# Patient Record
Sex: Female | Born: 1960 | Race: Black or African American | Hispanic: No | Marital: Single | Smoking: Former smoker
Health system: Southern US, Community
[De-identification: ages and names within clinical notes are randomized; demographics above are authoritative.]

## PROBLEM LIST (undated history)

## (undated) DIAGNOSIS — E785 Hyperlipidemia, unspecified: Secondary | ICD-10-CM

## (undated) DIAGNOSIS — I10 Essential (primary) hypertension: Secondary | ICD-10-CM

## (undated) DIAGNOSIS — M1990 Unspecified osteoarthritis, unspecified site: Secondary | ICD-10-CM

## (undated) DIAGNOSIS — G510 Bell's palsy: Secondary | ICD-10-CM

## (undated) DIAGNOSIS — E119 Type 2 diabetes mellitus without complications: Secondary | ICD-10-CM

## (undated) HISTORY — DX: Essential (primary) hypertension: I10

## (undated) HISTORY — DX: Type 2 diabetes mellitus without complications: E11.9

## (undated) HISTORY — DX: Hyperlipidemia, unspecified: E78.5

## (undated) HISTORY — PX: LAPAROSCOPY: SHX197

---

## 2010-03-05 ENCOUNTER — Ambulatory Visit: Payer: Self-pay | Admitting: Internal Medicine

## 2010-07-14 ENCOUNTER — Ambulatory Visit: Payer: Self-pay | Admitting: Internal Medicine

## 2011-07-23 ENCOUNTER — Ambulatory Visit: Payer: Self-pay | Admitting: Internal Medicine

## 2011-07-24 LAB — HM MAMMOGRAPHY: HM Mammogram: NORMAL

## 2012-09-01 LAB — HM PAP SMEAR: HM Pap smear: NORMAL

## 2013-10-02 LAB — LIPID PANEL: LDL CALC: 134 mg/dL

## 2013-10-05 LAB — TSH: TSH: 0.8 u[IU]/mL (ref <=5.90)

## 2013-10-05 LAB — LIPID PANEL
CHOLESTEROL: 199 mg/dL (ref 0–200)
HDL: 41 mg/dL (ref 35–70)
TRIGLYCERIDES: 120 mg/dL (ref 40–160)

## 2013-10-05 LAB — BASIC METABOLIC PANEL
BUN: 16 mg/dL (ref 4–21)
Creatinine: 0.8 mg/dL (ref <=1.1)

## 2013-10-05 LAB — CBC AND DIFFERENTIAL: Hemoglobin: 13 g/dL (ref 12.0–16.0)

## 2014-01-28 ENCOUNTER — Ambulatory Visit: Payer: Self-pay | Admitting: Internal Medicine

## 2014-02-08 DIAGNOSIS — G510 Bell's palsy: Secondary | ICD-10-CM

## 2014-02-08 HISTORY — PX: OTHER SURGICAL HISTORY: SHX169

## 2014-02-08 HISTORY — DX: Bell's palsy: G51.0

## 2014-09-26 ENCOUNTER — Other Ambulatory Visit: Payer: Self-pay | Admitting: Internal Medicine

## 2014-09-29 ENCOUNTER — Encounter: Payer: Self-pay | Admitting: Internal Medicine

## 2014-09-29 DIAGNOSIS — E785 Hyperlipidemia, unspecified: Secondary | ICD-10-CM

## 2014-09-29 DIAGNOSIS — I10 Essential (primary) hypertension: Secondary | ICD-10-CM | POA: Insufficient documentation

## 2014-09-29 DIAGNOSIS — E1169 Type 2 diabetes mellitus with other specified complication: Secondary | ICD-10-CM | POA: Insufficient documentation

## 2014-10-18 ENCOUNTER — Ambulatory Visit: Payer: Self-pay | Admitting: Internal Medicine

## 2015-01-17 ENCOUNTER — Encounter: Payer: Self-pay | Admitting: Internal Medicine

## 2015-01-21 ENCOUNTER — Encounter: Payer: Self-pay | Admitting: Internal Medicine

## 2015-01-24 ENCOUNTER — Encounter: Payer: Self-pay | Admitting: Internal Medicine

## 2015-01-24 ENCOUNTER — Ambulatory Visit (INDEPENDENT_AMBULATORY_CARE_PROVIDER_SITE_OTHER): Payer: Managed Care, Other (non HMO) | Admitting: Internal Medicine

## 2015-01-24 VITALS — BP 168/104 | HR 76 | Ht 64.75 in | Wt 270.4 lb

## 2015-01-24 DIAGNOSIS — Z1239 Encounter for other screening for malignant neoplasm of breast: Secondary | ICD-10-CM

## 2015-01-24 DIAGNOSIS — E784 Other hyperlipidemia: Secondary | ICD-10-CM | POA: Diagnosis not present

## 2015-01-24 DIAGNOSIS — E7849 Other hyperlipidemia: Secondary | ICD-10-CM

## 2015-01-24 DIAGNOSIS — I10 Essential (primary) hypertension: Secondary | ICD-10-CM

## 2015-01-24 MED ORDER — AMLODIPINE BESYLATE 5 MG PO TABS: 5.0000 mg | ORAL_TABLET | Freq: Every day | ORAL | Status: DC

## 2015-01-24 MED ORDER — LOSARTAN POTASSIUM-HCTZ 100-25 MG PO TABS: 1.0000 | ORAL_TABLET | Freq: Every day | ORAL | Status: DC

## 2015-01-24 NOTE — Patient Instructions (Signed)
DASH Eating Plan  DASH stands for "Dietary Approaches to Stop Hypertension." The DASH eating plan is a healthy eating plan that has been shown to reduce high blood pressure (hypertension). Additional health benefits may include reducing the risk of type 2 diabetes mellitus, heart disease, and stroke. The DASH eating plan may also help with weight loss.  WHAT DO I NEED TO KNOW ABOUT THE DASH EATING PLAN?  For the DASH eating plan, you will follow these general guidelines:  · Choose foods with a percent daily value for sodium of less than 5% (as listed on the food label).  · Use salt-free seasonings or herbs instead of table salt or sea salt.  · Check with your health care provider or pharmacist before using salt substitutes.  · Eat lower-sodium products, often labeled as "lower sodium" or "no salt added."  · Eat fresh foods.  · Eat more vegetables, fruits, and low-fat dairy products.  · Choose whole grains. Look for the word "whole" as the first word in the ingredient list.  · Choose fish and skinless chicken or turkey more often than red meat. Limit fish, poultry, and meat to 6 oz (170 g) each day.  · Limit sweets, desserts, sugars, and sugary drinks.  · Choose heart-healthy fats.  · Limit cheese to 1 oz (28 g) per day.  · Eat more home-cooked food and less restaurant, buffet, and fast food.  · Limit fried foods.  · Cook foods using methods other than frying.  · Limit canned vegetables. If you do use them, rinse them well to decrease the sodium.  · When eating at a restaurant, ask that your food be prepared with less salt, or no salt if possible.  WHAT FOODS CAN I EAT?  Seek help from a dietitian for individual calorie needs.  Grains  Whole grain or whole wheat bread. Brown rice. Whole grain or whole wheat pasta. Quinoa, bulgur, and whole grain cereals. Low-sodium cereals. Corn or whole wheat flour tortillas. Whole grain cornbread. Whole grain crackers. Low-sodium crackers.  Vegetables  Fresh or frozen vegetables  (raw, steamed, roasted, or grilled). Low-sodium or reduced-sodium tomato and vegetable juices. Low-sodium or reduced-sodium tomato sauce and paste. Low-sodium or reduced-sodium canned vegetables.   Fruits  All fresh, canned (in natural juice), or frozen fruits.  Meat and Other Protein Products  Ground beef (85% or leaner), grass-fed beef, or beef trimmed of fat. Skinless chicken or turkey. Ground chicken or turkey. Pork trimmed of fat. All fish and seafood. Eggs. Dried beans, peas, or lentils. Unsalted nuts and seeds. Unsalted canned beans.  Dairy  Low-fat dairy products, such as skim or 1% milk, 2% or reduced-fat cheeses, low-fat ricotta or cottage cheese, or plain low-fat yogurt. Low-sodium or reduced-sodium cheeses.  Fats and Oils  Tub margarines without trans fats. Light or reduced-fat mayonnaise and salad dressings (reduced sodium). Avocado. Safflower, olive, or canola oils. Natural peanut or almond butter.  Other  Unsalted popcorn and pretzels.  The items listed above may not be a complete list of recommended foods or beverages. Contact your dietitian for more options.  WHAT FOODS ARE NOT RECOMMENDED?  Grains  White bread. White pasta. White rice. Refined cornbread. Bagels and croissants. Crackers that contain trans fat.  Vegetables  Creamed or fried vegetables. Vegetables in a cheese sauce. Regular canned vegetables. Regular canned tomato sauce and paste. Regular tomato and vegetable juices.  Fruits  Dried fruits. Canned fruit in light or heavy syrup. Fruit juice.  Meat and Other Protein   Products  Fatty cuts of meat. Ribs, chicken wings, bacon, sausage, bologna, salami, chitterlings, fatback, hot dogs, bratwurst, and packaged luncheon meats. Salted nuts and seeds. Canned beans with salt.  Dairy  Whole or 2% milk, cream, half-and-half, and cream cheese. Whole-fat or sweetened yogurt. Full-fat cheeses or blue cheese. Nondairy creamers and whipped toppings. Processed cheese, cheese spreads, or cheese  curds.  Condiments  Onion and garlic salt, seasoned salt, table salt, and sea salt. Canned and packaged gravies. Worcestershire sauce. Tartar sauce. Barbecue sauce. Teriyaki sauce. Soy sauce, including reduced sodium. Steak sauce. Fish sauce. Oyster sauce. Cocktail sauce. Horseradish. Ketchup and mustard. Meat flavorings and tenderizers. Bouillon cubes. Hot sauce. Tabasco sauce. Marinades. Taco seasonings. Relishes.  Fats and Oils  Butter, stick margarine, lard, shortening, ghee, and bacon fat. Coconut, palm kernel, or palm oils. Regular salad dressings.  Other  Pickles and olives. Salted popcorn and pretzels.  The items listed above may not be a complete list of foods and beverages to avoid. Contact your dietitian for more information.  WHERE CAN I FIND MORE INFORMATION?  National Heart, Lung, and [employer] Institute: www.nhlbi.nih.gov/health/health-topics/topics/dash/     This information is not intended to replace advice given to you by your health care provider. Make sure you discuss any questions you have with your health care provider.     Document Released: 01/14/2011 Document Revised: 02/15/2014 Document Reviewed: 11/29/2012  Elsevier Interactive Patient Education ©2016 [employer] Inc[employer]

## 2015-01-24 NOTE — Progress Notes (Signed)
Date:  01/24/2015   Name:  Anita Krueger   DOB:  [birth year]   MRN:  [id number]   Chief Complaint: Hypertension Hypertension This is a chronic problem. The current episode started more than 1 year ago. The problem has been gradually worsening (BP running [id number] diastolic) since onset. Associated symptoms include shortness of breath. Pertinent negatives include no chest pain, headaches, orthopnea or palpitations. There are no associated agents to hypertension. Past treatments include diuretics and angiotensin blockers. The current treatment provides significant improvement. There are no compliance problems.   Hyperlipidemia This is a chronic problem. The problem is controlled. Lipid results: borderline high LDL. Exacerbating diseases include obesity. Associated symptoms include shortness of breath. Pertinent negatives include no chest pain. Compliance problems include adherence to diet and adherence to exercise.      Review of Systems  Constitutional: Negative for fever, chills and fatigue.  HENT: Negative for hearing loss.   Eyes: Negative for visual disturbance.  Respiratory: Positive for shortness of breath. Negative for cough, chest tightness and wheezing.   Cardiovascular: Positive for leg swelling. Negative for chest pain, palpitations and orthopnea.  Gastrointestinal: Negative for abdominal pain and blood in stool.  Genitourinary: Negative for dysuria.  Musculoskeletal: Negative for back pain and joint swelling.  Skin: Negative for rash.  Neurological: Negative for headaches.    Patient Active Problem List   Diagnosis Date Noted  . 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 Hypertension 09/29/2014    Prior to Admission medications   Medication Sig Start Date End Date Taking? Authorizing Provider  losartan-hydrochlorothiazide (HYZAAR) 100-25 MG per tablet Take 1 tablet by mouth  daily 09/26/14  Yes Glean Hess, MD    No Known Allergies  No past surgical  history on file.  Social History  Substance Use Topics  . Smoking status: Never Smoker   . Smokeless tobacco: None  . Alcohol Use: 1.2 oz/week    2 Standard drinks or equivalent per week    Medication list has been reviewed and updated.   Physical Exam  Constitutional: She is oriented to person, place, and time. She appears well-developed and well-nourished. No distress.  HENT:  Head: Normocephalic and atraumatic.  Eyes: Conjunctivae are normal. Right eye exhibits no discharge. Left eye exhibits no discharge. No scleral icterus.  Neck: Normal range of motion. Neck supple. Carotid bruit is not present. No thyromegaly present.  Cardiovascular: Normal rate, regular rhythm and normal heart sounds.   Pulmonary/Chest: Effort normal and breath sounds normal. No respiratory distress.  Musculoskeletal: Normal range of motion.  Lymphadenopathy:    She has no cervical adenopathy.  Neurological: She is alert and oriented to person, place, and time.  Skin: Skin is warm and dry. No rash noted.  Psychiatric: She has a normal mood and affect. Her behavior is normal. Thought content normal.  Nursing note and vitals reviewed.   BP 168/104 mmHg  Pulse 76  Ht 5' 4.75" (1.645 m)  Wt 270 lb 6.4 oz (122.653 kg)  BMI 45.33 kg/m2  Assessment and Plan: 1. Essential hypertension Add amlodipine 5 mg daily Recheck visit in 2 months - Comprehensive metabolic panel - TSH - losartan-hydrochlorothiazide (HYZAAR) 100-25 MG tablet; Take 1 tablet by mouth daily.  Dispense: 90 tablet; Refill: 3 - amLODipine (NORVASC) 5 MG tablet; Take 1 tablet (5 mg total) by mouth daily.  Dispense: 90 tablet; Refill: 3  2. Breast cancer screening - MM DIGITAL SCREENING BILATERAL; Future  3. Familial multiple lipoprotein-type hyperlipidemia Borderline elevated lipids  in the past Reinforced diet and exercise and will prescribe medication if indicated - Lipid panel   Halina Maidens, MD [facility]  01/24/2015

## 2015-01-25 LAB — COMPREHENSIVE METABOLIC PANEL
ALBUMIN: 4.3 g/dL (ref 3.5–5.5)
ALT: 35 IU/L — ABNORMAL HIGH (ref 0–32)
AST: 37 IU/L (ref 0–40)
Albumin/Globulin Ratio: 1.6 (ref 1.1–2.5)
Alkaline Phosphatase: 78 IU/L (ref 39–117)
BUN / CREAT RATIO: 17 (ref 9–23)
BUN: 12 mg/dL (ref 6–24)
Bilirubin Total: 0.3 mg/dL (ref 0.0–1.2)
CALCIUM: 9.5 mg/dL (ref 8.7–10.2)
CO2: 26 mmol/L (ref 18–29)
CREATININE: 0.69 mg/dL (ref 0.57–1.00)
Chloride: 97 mmol/L (ref 96–106)
GFR, EST AFRICAN AMERICAN: 115 mL/min/{1.73_m2} (ref >59)
GFR, EST NON AFRICAN AMERICAN: 100 mL/min/{1.73_m2} (ref >59)
GLOBULIN, TOTAL: 2.7 g/dL (ref 1.5–4.5)
Glucose: 127 mg/dL — ABNORMAL HIGH (ref 65–99)
Potassium: 4 mmol/L (ref 3.5–5.2)
SODIUM: 140 mmol/L (ref 134–144)
Total Protein: 7 g/dL (ref 6.0–8.5)

## 2015-01-25 LAB — LIPID PANEL
CHOL/HDL RATIO: 4.6 ratio — AB (ref 0.0–4.4)
Cholesterol, Total: 192 mg/dL (ref 100–199)
HDL: 42 mg/dL (ref >39)
LDL CALC: 123 mg/dL — AB (ref 0–99)
TRIGLYCERIDES: 135 mg/dL (ref 0–149)
VLDL Cholesterol Cal: 27 mg/dL (ref 5–40)

## 2015-01-25 LAB — TSH: TSH: 1.13 u[IU]/mL (ref 0.450–4.500)

## 2015-01-27 ENCOUNTER — Other Ambulatory Visit: Payer: Self-pay | Admitting: Internal Medicine

## 2015-01-27 DIAGNOSIS — E7849 Other hyperlipidemia: Secondary | ICD-10-CM

## 2015-01-27 DIAGNOSIS — R7309 Other abnormal glucose: Secondary | ICD-10-CM

## 2015-01-27 DIAGNOSIS — E118 Type 2 diabetes mellitus with unspecified complications: Secondary | ICD-10-CM | POA: Insufficient documentation

## 2015-01-27 MED ORDER — ATORVASTATIN CALCIUM 10 MG PO TABS: 10.0000 mg | ORAL_TABLET | Freq: Every day | ORAL | Status: DC

## 2015-03-07 ENCOUNTER — Ambulatory Visit
Admission: RE | Admit: 2015-03-07 | Discharge: 2015-03-07 | Disposition: A | Discharge status: Home / Self Care | Payer: Managed Care, Other (non HMO) | Source: Ambulatory Visit | Attending: Internal Medicine | Admitting: Internal Medicine

## 2015-03-07 DIAGNOSIS — Z1231 Encounter for screening mammogram for malignant neoplasm of breast: Secondary | ICD-10-CM | POA: Insufficient documentation

## 2015-03-07 DIAGNOSIS — Z1239 Encounter for other screening for malignant neoplasm of breast: Secondary | ICD-10-CM

## 2015-04-04 ENCOUNTER — Encounter: Payer: Self-pay | Admitting: Internal Medicine

## 2015-04-04 ENCOUNTER — Ambulatory Visit (INDEPENDENT_AMBULATORY_CARE_PROVIDER_SITE_OTHER): Payer: Managed Care, Other (non HMO) | Admitting: Internal Medicine

## 2015-04-04 VITALS — BP 148/96 | HR 72 | Ht 64.75 in | Wt 265.0 lb

## 2015-04-04 DIAGNOSIS — E784 Other hyperlipidemia: Secondary | ICD-10-CM

## 2015-04-04 DIAGNOSIS — R7309 Other abnormal glucose: Secondary | ICD-10-CM

## 2015-04-04 DIAGNOSIS — E7849 Other hyperlipidemia: Secondary | ICD-10-CM

## 2015-04-04 DIAGNOSIS — Z1159 Encounter for screening for other viral diseases: Secondary | ICD-10-CM

## 2015-04-04 DIAGNOSIS — Z0000 Encounter for general adult medical examination without abnormal findings: Secondary | ICD-10-CM

## 2015-04-04 DIAGNOSIS — I10 Essential (primary) hypertension: Secondary | ICD-10-CM | POA: Diagnosis not present

## 2015-04-04 LAB — POC URINALYSIS WITH MICROSCOPIC (NON AUTO)MANUAL RESULT
BILIRUBIN UA: NEGATIVE
Crystals: 0
EPITHELIAL CELLS, URINE PER MICROSCOPY: 5
Glucose, UA: NEGATIVE
Ketones, UA: NEGATIVE
Mucus, UA: 0
NITRITE UA: NEGATIVE
PH UA: 6.5
Protein, UA: NEGATIVE
RBC: 2 M/uL — AB (ref 4.04–5.48)
SPEC GRAV UA: 1.01
UROBILINOGEN UA: 0.2
WBC Casts, UA: 3

## 2015-04-04 NOTE — Progress Notes (Signed)
Date:  04/04/2015   Name:  SAINA MANFRED   DOB:  30-Sep-1960   MRN:  [id number]   Chief Complaint: Annual Exam Rosalee ALIZIA JANKOVIC is a 55 y.o. female who presents today for her Complete Annual Exam. She feels well. She reports exercising walking daily. She reports she is sleeping fairly well. Recent mammogram was normal. She still has intermittent menstrual bleeding that is very light.  Last Pap was 2014.  Hypertension This is a chronic problem. The current episode started more than 1 year ago. The problem is unchanged (systolic around [id number]). The problem is controlled. Pertinent negatives include no chest pain, headaches, palpitations or shortness of breath. There are no known risk factors for coronary artery disease. Past treatments include calcium channel blockers, angiotensin blockers and diuretics (amlodipine added last visit). The current treatment provides significant improvement.  Hyperlipidemia This is a chronic problem. The current episode started more than 1 year ago. Pertinent negatives include no chest pain or shortness of breath. She is currently on no antihyperlipidemic treatment. The current treatment provides significant improvement of lipids.  10 yr ASCVD calculated at 15%.  She is hesitant to start medication due to possible side effects.  We discussed risk reduction and she agrees to try atorvastatin.   Review of Systems  Constitutional: Negative for fever, chills and fatigue.  HENT: Negative for congestion, hearing loss, tinnitus, trouble swallowing and voice change.   Eyes: Negative for visual disturbance.  Respiratory: Negative for cough, chest tightness, shortness of breath and wheezing.   Cardiovascular: Negative for chest pain, palpitations and leg swelling.  Gastrointestinal: Negative for vomiting, abdominal pain, diarrhea and constipation.  Endocrine: Negative for polydipsia and polyuria.  Genitourinary: Negative for dysuria, frequency, vaginal bleeding, vaginal  discharge and genital sores.  Musculoskeletal: Negative for joint swelling, arthralgias and gait problem.  Skin: Negative for color change and rash.  Neurological: Negative for dizziness, tremors, light-headedness and headaches.  Hematological: Negative for adenopathy. Does not bruise/bleed easily.  Psychiatric/Behavioral: Negative for sleep disturbance and dysphoric mood. The patient is not nervous/anxious.     Patient Active Problem List   Diagnosis Date Noted  . Elevated random blood glucose level 01/27/2015  . 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 Hypertension 09/29/2014    Prior to Admission medications   Medication Sig Start Date End Date Taking? Authorizing Provider  amLODipine (NORVASC) 5 MG tablet Take 1 tablet (5 mg total) by mouth daily. 01/24/15  Yes Glean Hess, MD  losartan-hydrochlorothiazide (HYZAAR) 100-25 MG tablet Take 1 tablet by mouth daily. 01/24/15  Yes Glean Hess, MD  atorvastatin (LIPITOR) 10 MG tablet Take 1 tablet (10 mg total) by mouth daily. Patient not taking: Reported on 04/04/2015 01/27/15   Glean Hess, MD    No Known Allergies  Past Surgical History  Procedure Laterality Date  . No past surgeries      Social History  Substance Use Topics  . Smoking status: Never Smoker   . Smokeless tobacco: None  . Alcohol Use: 1.2 oz/week    2 Standard drinks or equivalent per week     Comment: occasional     Medication list has been reviewed and updated.   Physical Exam  Constitutional: She is oriented to person, place, and time. She appears well-developed and well-nourished. No distress.  HENT:  Head: Normocephalic and atraumatic.  Right Ear: Tympanic membrane and ear canal normal.  Left Ear: Tympanic membrane and ear canal normal.  Nose: Right sinus exhibits  no maxillary sinus tenderness. Left sinus exhibits no maxillary sinus tenderness.  Mouth/Throat: Uvula is midline and oropharynx is clear and moist.    Eyes: Conjunctivae and EOM are normal. Right eye exhibits no discharge. Left eye exhibits no discharge. No scleral icterus.  Neck: Normal range of motion. Carotid bruit is not present. No erythema present. No thyromegaly present.  Cardiovascular: Normal rate, regular rhythm, normal heart sounds and normal pulses.   Pulmonary/Chest: Effort normal. No respiratory distress. She has no wheezes. Right breast exhibits no mass, no nipple discharge, no skin change and no tenderness. Left breast exhibits no mass, no nipple discharge, no skin change and no tenderness.  Abdominal: Soft. Bowel sounds are normal. There is no hepatosplenomegaly. There is no tenderness. There is no CVA tenderness.  Musculoskeletal: Normal range of motion.  Lymphadenopathy:    She has no cervical adenopathy.    She has no axillary adenopathy.  Neurological: She is alert and oriented to person, place, and time. She has normal reflexes. No cranial nerve deficit or sensory deficit.  Skin: Skin is warm, dry and intact. No rash noted.  Psychiatric: She has a normal mood and affect. Her speech is normal and behavior is normal. Thought content normal.  Nursing note and vitals reviewed.   BP 148/96 mmHg  Pulse 72  Ht 5' 4.75" (1.645 m)  Wt 265 lb (120.203 kg)  BMI 44.42 kg/m2  Assessment and Plan: 1. Annual physical exam Pap next year Pt declines Colonoscopy at this time - Fecal occult blood, imunochemical - POC urinalysis w microscopic (non auto)  2. Essential hypertension Improved control - CBC with Differential/Platelet - Comprehensive metabolic panel - TSH  3. Familial multiple lipoprotein-type hyperlipidemia Begin atorvastatin and recheck in 4 months - Lipid panel  4. Elevated random blood glucose level Continue efforts at low carb diet - Hemoglobin A1c  5. Need for hepatitis C screening test - Hepatitis C antibody   Halina Maidens, MD [facility]  04/04/2015

## 2015-04-04 NOTE — Patient Instructions (Signed)
DASH Eating Plan  DASH stands for "Dietary Approaches to Stop Hypertension." The DASH eating plan is a healthy eating plan that has been shown to reduce high blood pressure (hypertension). Additional health benefits may include reducing the risk of type 2 diabetes mellitus, heart disease, and stroke. The DASH eating plan may also help with weight loss.  WHAT DO I NEED TO KNOW ABOUT THE DASH EATING PLAN?  For the DASH eating plan, you will follow these general guidelines:  · Choose foods with a percent daily value for sodium of less than 5% (as listed on the food label).  · Use salt-free seasonings or herbs instead of table salt or sea salt.  · Check with your health care provider or pharmacist before using salt substitutes.  · Eat lower-sodium products, often labeled as "lower sodium" or "no salt added."  · Eat fresh foods.  · Eat more vegetables, fruits, and low-fat dairy products.  · Choose whole grains. Look for the word "whole" as the first word in the ingredient list.  · Choose fish and skinless chicken or turkey more often than red meat. Limit fish, poultry, and meat to 6 oz (170 g) each day.  · Limit sweets, desserts, sugars, and sugary drinks.  · Choose heart-healthy fats.  · Limit cheese to 1 oz (28 g) per day.  · Eat more home-cooked food and less restaurant, buffet, and fast food.  · Limit fried foods.  · Cook foods using methods other than frying.  · Limit canned vegetables. If you do use them, rinse them well to decrease the sodium.  · When eating at a restaurant, ask that your food be prepared with less salt, or no salt if possible.  WHAT FOODS CAN I EAT?  Seek help from a dietitian for individual calorie needs.  Grains  Whole grain or whole wheat bread. Brown rice. Whole grain or whole wheat pasta. Quinoa, bulgur, and whole grain cereals. Low-sodium cereals. Corn or whole wheat flour tortillas. Whole grain cornbread. Whole grain crackers. Low-sodium crackers.  Vegetables  Fresh or frozen vegetables  (raw, steamed, roasted, or grilled). Low-sodium or reduced-sodium tomato and vegetable juices. Low-sodium or reduced-sodium tomato sauce and paste. Low-sodium or reduced-sodium canned vegetables.   Fruits  All fresh, canned (in natural juice), or frozen fruits.  Meat and Other Protein Products  Ground beef (85% or leaner), grass-fed beef, or beef trimmed of fat. Skinless chicken or turkey. Ground chicken or turkey. Pork trimmed of fat. All fish and seafood. Eggs. Dried beans, peas, or lentils. Unsalted nuts and seeds. Unsalted canned beans.  Dairy  Low-fat dairy products, such as skim or 1% milk, 2% or reduced-fat cheeses, low-fat ricotta or cottage cheese, or plain low-fat yogurt. Low-sodium or reduced-sodium cheeses.  Fats and Oils  Tub margarines without trans fats. Light or reduced-fat mayonnaise and salad dressings (reduced sodium). Avocado. Safflower, olive, or canola oils. Natural peanut or almond butter.  Other  Unsalted popcorn and pretzels.  The items listed above may not be a complete list of recommended foods or beverages. Contact your dietitian for more options.  WHAT FOODS ARE NOT RECOMMENDED?  Grains  White bread. White pasta. White rice. Refined cornbread. Bagels and croissants. Crackers that contain trans fat.  Vegetables  Creamed or fried vegetables. Vegetables in a cheese sauce. Regular canned vegetables. Regular canned tomato sauce and paste. Regular tomato and vegetable juices.  Fruits  Dried fruits. Canned fruit in light or heavy syrup. Fruit juice.  Meat and Other Protein   Products  Fatty cuts of meat. Ribs, chicken wings, bacon, sausage, bologna, salami, chitterlings, fatback, hot dogs, bratwurst, and packaged luncheon meats. Salted nuts and seeds. Canned beans with salt.  Dairy  Whole or 2% milk, cream, half-and-half, and cream cheese. Whole-fat or sweetened yogurt. Full-fat cheeses or blue cheese. Nondairy creamers and whipped toppings. Processed cheese, cheese spreads, or cheese  curds.  Condiments  Onion and garlic salt, seasoned salt, table salt, and sea salt. Canned and packaged gravies. Worcestershire sauce. Tartar sauce. Barbecue sauce. Teriyaki sauce. Soy sauce, including reduced sodium. Steak sauce. Fish sauce. Oyster sauce. Cocktail sauce. Horseradish. Ketchup and mustard. Meat flavorings and tenderizers. Bouillon cubes. Hot sauce. Tabasco sauce. Marinades. Taco seasonings. Relishes.  Fats and Oils  Butter, stick margarine, lard, shortening, ghee, and bacon fat. Coconut, palm kernel, or palm oils. Regular salad dressings.  Other  Pickles and olives. Salted popcorn and pretzels.  The items listed above may not be a complete list of foods and beverages to avoid. Contact your dietitian for more information.  WHERE CAN I FIND MORE INFORMATION?  National Heart, Lung, and [employer] Institute: www.nhlbi.nih.gov/health/health-topics/topics/dash/     This information is not intended to replace advice given to you by your health care provider. Make sure you discuss any questions you have with your health care provider.     Document Released: 01/14/2011 Document Revised: 02/15/2014 Document Reviewed: 11/29/2012  Elsevier Interactive Patient Education ©2016 [employer] Inc[employer]

## 2015-04-05 LAB — LIPID PANEL
CHOLESTEROL TOTAL: 202 mg/dL — AB (ref 100–199)
Chol/HDL Ratio: 4.5 ratio units — ABNORMAL HIGH (ref 0.0–4.4)
HDL: 45 mg/dL (ref >39)
LDL Calculated: 131 mg/dL — ABNORMAL HIGH (ref 0–99)
Triglycerides: 130 mg/dL (ref 0–149)
VLDL CHOLESTEROL CAL: 26 mg/dL (ref 5–40)

## 2015-04-05 LAB — CBC WITH DIFFERENTIAL/PLATELET
BASOS: 0 %
Basophils Absolute: 0 10*3/uL (ref 0.0–0.2)
EOS (ABSOLUTE): 0.2 10*3/uL (ref 0.0–0.4)
Eos: 2 %
HEMOGLOBIN: 12.9 g/dL (ref 11.1–15.9)
Hematocrit: 39.2 % (ref 34.0–46.6)
IMMATURE GRANS (ABS): 0 10*3/uL (ref 0.0–0.1)
Immature Granulocytes: 0 %
LYMPHS ABS: 2.3 10*3/uL (ref 0.7–3.1)
LYMPHS: 30 %
MCH: 27.6 pg (ref 26.6–33.0)
MCHC: 32.9 g/dL (ref 31.5–35.7)
MCV: 84 fL (ref 79–97)
MONOCYTES: 6 %
Monocytes Absolute: 0.5 10*3/uL (ref 0.1–0.9)
NEUTROS ABS: 4.7 10*3/uL (ref 1.4–7.0)
Neutrophils: 62 %
Platelets: 277 10*3/uL (ref 150–379)
RBC: 4.68 x10E6/uL (ref 3.77–5.28)
RDW: 16.2 % — ABNORMAL HIGH (ref 12.3–15.4)
WBC: 7.7 10*3/uL (ref 3.4–10.8)

## 2015-04-05 LAB — COMPREHENSIVE METABOLIC PANEL
A/G RATIO: 1.6 (ref 1.1–2.5)
ALBUMIN: 4.4 g/dL (ref 3.5–5.5)
ALK PHOS: 69 IU/L (ref 39–117)
ALT: 34 IU/L — ABNORMAL HIGH (ref 0–32)
AST: 34 IU/L (ref 0–40)
BILIRUBIN TOTAL: 0.4 mg/dL (ref 0.0–1.2)
BUN / CREAT RATIO: 17 (ref 9–23)
BUN: 11 mg/dL (ref 6–24)
CO2: 25 mmol/L (ref 18–29)
Calcium: 9.2 mg/dL (ref 8.7–10.2)
Chloride: 99 mmol/L (ref 96–106)
Creatinine, Ser: 0.65 mg/dL (ref 0.57–1.00)
GFR calc Af Amer: 117 mL/min/{1.73_m2} (ref >59)
GFR calc non Af Amer: 102 mL/min/{1.73_m2} (ref >59)
GLOBULIN, TOTAL: 2.7 g/dL (ref 1.5–4.5)
GLUCOSE: 93 mg/dL (ref 65–99)
Potassium: 4 mmol/L (ref 3.5–5.2)
SODIUM: 143 mmol/L (ref 134–144)
Total Protein: 7.1 g/dL (ref 6.0–8.5)

## 2015-04-05 LAB — HEMOGLOBIN A1C
Est. average glucose Bld gHb Est-mCnc: 134 mg/dL
HEMOGLOBIN A1C: 6.3 % — AB (ref 4.8–5.6)

## 2015-04-05 LAB — HEPATITIS C ANTIBODY

## 2015-04-05 LAB — TSH: TSH: 0.833 u[IU]/mL (ref 0.450–4.500)

## 2015-04-07 ENCOUNTER — Telehealth: Payer: Self-pay

## 2015-04-07 NOTE — Telephone Encounter (Signed)
Left message for patient to call back  

## 2015-04-07 NOTE — Telephone Encounter (Signed)
-----   Message from Glean Hess, MD sent at 04/05/2015 10:06 AM EST ----- Cholesterol remains elevated.  BS is still in the pre-diabetic range - no meds but work on low carb diet.  All other labs are normal.  Hep C is negative.

## 2015-04-10 NOTE — Telephone Encounter (Signed)
Left message for patient to call back  

## 2015-04-11 NOTE — Telephone Encounter (Signed)
Spoke with patient. Patient advised of all results and verbalized understanding. Will call back with any future questions or concerns. MAH  

## 2015-04-12 LAB — FECAL OCCULT BLOOD, IMMUNOCHEMICAL: Fecal Occult Bld: NEGATIVE

## 2015-04-16 ENCOUNTER — Telehealth: Payer: Self-pay

## 2015-04-16 NOTE — Telephone Encounter (Signed)
Left message for patient to call back  

## 2015-04-16 NOTE — Telephone Encounter (Signed)
-----   Message from Glean Hess, MD sent at 04/12/2015  2:06 PM EST ----- Stool hemoccult testing is negative.

## 2015-04-18 NOTE — Telephone Encounter (Signed)
Spoke with patient. Patient advised of all results and verbalized understanding. Will call back with any future questions or concerns. MAH  

## 2015-08-01 ENCOUNTER — Ambulatory Visit: Payer: Self-pay | Admitting: Internal Medicine

## 2015-08-08 ENCOUNTER — Ambulatory Visit: Payer: Self-pay | Admitting: Internal Medicine

## 2015-09-19 ENCOUNTER — Ambulatory Visit: Payer: Self-pay | Admitting: Internal Medicine

## 2016-02-11 ENCOUNTER — Other Ambulatory Visit: Payer: Self-pay | Admitting: Internal Medicine

## 2016-02-11 DIAGNOSIS — I10 Essential (primary) hypertension: Secondary | ICD-10-CM

## 2016-02-20 ENCOUNTER — Encounter: Payer: Self-pay | Admitting: Internal Medicine

## 2016-04-21 ENCOUNTER — Ambulatory Visit
Admission: EM | Admit: 2016-04-21 | Discharge: 2016-04-21 | Disposition: A | Discharge status: Home / Self Care | Payer: Managed Care, Other (non HMO) | Attending: Internal Medicine | Admitting: Internal Medicine

## 2016-04-21 ENCOUNTER — Encounter: Payer: Self-pay | Admitting: Emergency Medicine

## 2016-04-21 DIAGNOSIS — S76212A Strain of adductor muscle, fascia and tendon of left thigh, initial encounter: Secondary | ICD-10-CM

## 2016-04-21 MED ORDER — CYCLOBENZAPRINE HCL 10 MG PO TABS: 10.0000 mg | ORAL_TABLET | Freq: Two times a day (BID) | ORAL | 0 refills | Status: DC | PRN

## 2016-04-21 NOTE — ED Triage Notes (Signed)
Patient c/o pain on the left side of her abdomen that started today.  Patient denies N/V/D.  Patient denies fevers.

## 2016-04-21 NOTE — ED Provider Notes (Signed)
[facility]    CSN: [id number] Arrival date & time: 04/21/16  1750     History   Chief Complaint Chief Complaint  Patient presents with  . Abdominal Pain    LLQ    HPI Anita Krueger is a 56 y.o. female.   Pt presents to clinic with groin pain. Initially said abdominal pain but she points clearly to her left groin.  Denies radiation of pain. States that it began hurting a few days ago after exercise.  Now the pain limits her ability to rise from a seated position without pain.  She also feels pain in her groin when she walks briskly.  Denies N/V/D or urinary symptoms.  Also denies neurologic complaints.      History reviewed. No pertinent past medical history.  Patient Active Problem List   Diagnosis Date Noted  . Essential hypertension 04/04/2015  . Elevated random blood glucose level 01/27/2015  . 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 NO PAST SURGERIES      OB History    No data available       Home Medications    Prior to Admission medications   Medication Sig Start Date End Date Taking? Authorizing Provider  cyclobenzaprine (FLEXERIL) 10 MG tablet Take 1 tablet (10 mg total) by mouth 2 (two) times daily as needed for muscle spasms. 04/21/16   Harrie Foreman, MD  losartan-hydrochlorothiazide [facility]) 100-25 MG tablet TAKE 1 TABLET BY MOUTH  DAILY 02/11/16   Glean Hess, MD    Family History Family History  Problem Relation Age of Onset  . Hypertension Mother   . Diabetes Mother   . Hypertension Father   . Breast cancer Cousin     PAT COUSIN    Social History Social History  Substance Use Topics  . Smoking status: Never Smoker  . Smokeless tobacco: Never Used  . Alcohol use 1.2 oz/week    2 Standard drinks or equivalent per week     Comment: occasional     Allergies   Patient has no known allergies.   Review of Systems Review of Systems    Constitutional: Negative for chills and fever.  HENT: Negative for sore throat and tinnitus.   Eyes: Negative for redness.  Respiratory: Negative for cough and shortness of breath.   Cardiovascular: Negative for chest pain and palpitations.  Gastrointestinal: Negative for abdominal pain, diarrhea, nausea and vomiting.  Genitourinary: Negative for dysuria, frequency and urgency.  Musculoskeletal: Negative for myalgias.       See HPI  Skin: Negative for rash.       No lesions  Neurological: Negative for weakness.  Hematological: Does not bruise/bleed easily.  Psychiatric/Behavioral: Negative for suicidal ideas.     Physical Exam Triage Vital Signs ED Triage Vitals  Enc Vitals Group     BP 04/21/16 1809 (!) 159/76     Pulse Rate 04/21/16 1809 94     Resp 04/21/16 1809 16     Temp 04/21/16 1809 98.2 F (36.8 C)     Temp Source 04/21/16 1809 Oral     SpO2 04/21/16 1809 100 %     Weight 04/21/16 1807 275 lb (124.7 kg)     Height 04/21/16 1807 5\' 4"  (1.626 m)     Head Circumference --      Peak Flow --      Pain Score 04/21/16 1809 8     Pain  Loc --      Pain Edu? --      Excl. in [place]? --    No data found.   Updated Vital Signs BP (!) 159/76 (BP Location: Right Arm)   Pulse 94   Temp 98.2 F (36.8 C) (Oral)   Resp 16   Ht 5\' 4"  (1.626 m)   Wt 275 lb (124.7 kg)   SpO2 100%   BMI 47.20 kg/m   Visual Acuity Right Eye Distance:   Left Eye Distance:   Bilateral Distance:    Right Eye Near:   Left Eye Near:    Bilateral Near:     Physical Exam  Constitutional: She is oriented to person, place, and time. She appears well-developed and well-nourished. No distress.  HENT:  Head: Normocephalic and atraumatic.  Mouth/Throat: Oropharynx is clear and moist.  Eyes: Conjunctivae and EOM are normal. Pupils are equal, round, and reactive to light. No scleral icterus.  Neck: Normal range of motion. Neck supple. No JVD present. No tracheal deviation present. No thyromegaly  present.  Cardiovascular: Normal rate, regular rhythm and normal heart sounds.  Exam reveals no gallop and no friction rub.   No murmur heard. Pulmonary/Chest: Effort normal and breath sounds normal.  Abdominal: Soft. Bowel sounds are normal. She exhibits no distension. There is no tenderness.  Musculoskeletal: Normal range of motion. She exhibits tenderness (left groin). She exhibits no edema.  Lymphadenopathy:    She has no cervical adenopathy.  Neurological: She is alert and oriented to person, place, and time. No cranial nerve deficit.  Skin: Skin is warm and dry.  Psychiatric: She has a normal mood and affect. Her behavior is normal. Judgment and thought content normal.  Nursing note and vitals reviewed.    UC Treatments / Results  Labs (all labs ordered are listed, but only abnormal results are displayed) Labs Reviewed - No data to display  EKG  EKG Interpretation None       Radiology No results found.  Procedures Procedures (including critical care time)  Medications Ordered in UC Medications - No data to display   Initial Impression / Assessment and Plan / UC Course  I have reviewed the triage vital signs and the nursing notes.  Pertinent labs & imaging results that were available during my care of the patient were reviewed by me and considered in my medical decision making (see chart for details).     Reproducible left groin pain.  Musculoskeletal.  D/w pt stretching exercises and NSAID use.  Rx muscle relaxant.  Final Clinical Impressions(s) / UC Diagnoses   Final diagnoses:  Groin strain, left, initial encounter    New Prescriptions New Prescriptions   CYCLOBENZAPRINE (FLEXERIL) 10 MG TABLET    Take 1 tablet (10 mg total) by mouth 2 (two) times daily as needed for muscle spasms.     Harrie Foreman, MD 04/21/16 [phone]

## 2016-05-04 ENCOUNTER — Encounter: Payer: Self-pay | Admitting: Internal Medicine

## 2016-05-04 ENCOUNTER — Ambulatory Visit (INDEPENDENT_AMBULATORY_CARE_PROVIDER_SITE_OTHER): Payer: Managed Care, Other (non HMO) | Admitting: Internal Medicine

## 2016-05-04 ENCOUNTER — Ambulatory Visit
Admission: RE | Admit: 2016-05-04 | Discharge: 2016-05-04 | Disposition: A | Discharge status: Home / Self Care | Payer: Managed Care, Other (non HMO) | Source: Ambulatory Visit | Attending: Internal Medicine | Admitting: Internal Medicine

## 2016-05-04 VITALS — BP 162/94 | HR 90 | Ht 64.75 in | Wt 273.0 lb

## 2016-05-04 DIAGNOSIS — M25552 Pain in left hip: Secondary | ICD-10-CM

## 2016-05-04 DIAGNOSIS — M25852 Other specified joint disorders, left hip: Secondary | ICD-10-CM | POA: Diagnosis not present

## 2016-05-04 NOTE — Progress Notes (Signed)
Date:  05/04/2016   Name:  Anita Krueger   DOB:  1960-08-31   MRN:  [id number]   Chief Complaint: Groin Pain (Left groin pain radiating down thigh. Tried ICY-HOT and did not help. Flexeril does not help. X 5 weeks ago and started to become worse. Seen UC- and was told it may be pulled muscle. Causing pt to have difficulty walking on leg. Pt did exercise dvd, and "might have pulled something." ) She was seen in UC but no xrays were done.  She has not had any benefit from flexeril.  She has been taking ibuprofen 400 mg with some benefit.  Overall, her pain is just not improving.    Review of Systems  Constitutional: Negative for chills and fever.  Respiratory: Negative for chest tightness and shortness of breath.   Cardiovascular: Negative for chest pain and leg swelling.  Musculoskeletal: Positive for arthralgias (pain in anterior left hip/groin with occasional radiation to upper anteroir thigh) and gait problem. Negative for joint swelling.  Neurological: Negative for weakness and numbness.  Hematological: Negative for adenopathy.    Patient Active Problem List   Diagnosis Date Noted  . Essential hypertension 04/04/2015  . Elevated random blood glucose level 01/27/2015  . 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 Authorizing Provider  cyclobenzaprine (FLEXERIL) 10 MG tablet Take 1 tablet (10 mg total) by mouth 2 (two) times daily as needed for muscle spasms. 04/21/16  Yes Harrie Foreman, MD  losartan-hydrochlorothiazide [facility]) 100-25 MG tablet TAKE 1 TABLET BY MOUTH  DAILY 02/11/16  Yes Glean Hess, MD    No Known Allergies  Past Surgical History:  Procedure Laterality Date  . NO PAST SURGERIES      Social History  Substance Use Topics  . Smoking status: Never Smoker  . Smokeless tobacco: Never Used  . Alcohol use 1.2 oz/week    2 Standard drinks or equivalent per  week     Comment: occasional     Medication list has been reviewed and updated.   Physical Exam  Constitutional: She is oriented to person, place, and time. She appears well-developed. No distress.  HENT:  Head: Normocephalic and atraumatic.  Cardiovascular: Normal rate, regular rhythm and normal heart sounds.   Pulmonary/Chest: Effort normal and breath sounds normal. No respiratory distress.  Abdominal: No hernia.  Musculoskeletal:       Right hip: Normal.       Left hip: She exhibits decreased range of motion (pain to internal rotation and mild pain to SLR) and tenderness.       Lumbar back: She exhibits normal range of motion and no tenderness.  Neurological: She is alert and oriented to person, place, and time.  Skin: Skin is warm, dry and intact. No bruising, no ecchymosis and no rash noted.  Psychiatric: She has a normal mood and affect. Her behavior is normal. Thought content normal.  Nursing note and vitals reviewed.   BP (!) 162/94 (BP Location: Right Arm, Patient Position: Sitting, Cuff Size: Large)   Pulse 90   Ht 5' 4.75" (1.645 m)   Wt 273 lb (123.8 kg)   SpO2 98%   BMI 45.78 kg/m   Assessment and Plan: 1. Left hip pain Suspect ligament strain Increase advil to 800 mg tid, use heat bid Xray to rule out hip pathology - DG HIP UNILAT WITH PELVIS 2-3 VIEWS LEFT; Future  No orders of the defined types were placed in this encounter.   Halina Maidens, MD [facility]  05/04/2016

## 2016-05-04 NOTE — Patient Instructions (Signed)
Use heat 20 minutes twice a day  Ibuprofen 800 mg three times a day with food

## 2016-05-11 ENCOUNTER — Other Ambulatory Visit: Payer: Self-pay | Admitting: Internal Medicine

## 2016-05-11 ENCOUNTER — Telehealth: Payer: Self-pay

## 2016-05-11 DIAGNOSIS — M25559 Pain in unspecified hip: Secondary | ICD-10-CM

## 2016-05-11 DIAGNOSIS — I10 Essential (primary) hypertension: Secondary | ICD-10-CM

## 2016-05-11 NOTE — Telephone Encounter (Signed)
Order for Ortho sent.

## 2016-05-11 NOTE — Telephone Encounter (Signed)
Pt informed

## 2016-05-11 NOTE — Telephone Encounter (Signed)
Pt called left VM on nurse line stating she "can hardly walk and needs a electric wheelchair. Wants fax number to fax over information." I called pt back and informed her that with having a normal XR, and the cause of pain being just possible ligaments torn, Dr. Army Melia can not approve for her to be given a electric wheelchair. The next thing and only step to do from here is to be sent to Ortho for evaluation there. She said she will be fine with this.

## 2016-05-12 ENCOUNTER — Encounter: Payer: Self-pay | Admitting: Internal Medicine

## 2016-05-12 DIAGNOSIS — M25552 Pain in left hip: Secondary | ICD-10-CM | POA: Insufficient documentation

## 2016-07-23 ENCOUNTER — Encounter: Payer: Self-pay | Admitting: Internal Medicine

## 2016-11-23 ENCOUNTER — Other Ambulatory Visit: Payer: Self-pay | Admitting: Internal Medicine

## 2016-11-23 DIAGNOSIS — Z1231 Encounter for screening mammogram for malignant neoplasm of breast: Secondary | ICD-10-CM

## 2016-12-09 ENCOUNTER — Ambulatory Visit
Admission: RE | Admit: 2016-12-09 | Discharge: 2016-12-09 | Disposition: A | Discharge status: Home / Self Care | Payer: Managed Care, Other (non HMO) | Source: Ambulatory Visit | Attending: Internal Medicine | Admitting: Internal Medicine

## 2016-12-09 DIAGNOSIS — Z1231 Encounter for screening mammogram for malignant neoplasm of breast: Secondary | ICD-10-CM | POA: Insufficient documentation

## 2016-12-25 ENCOUNTER — Other Ambulatory Visit: Payer: Self-pay | Admitting: Internal Medicine

## 2016-12-25 DIAGNOSIS — I10 Essential (primary) hypertension: Secondary | ICD-10-CM

## 2017-01-14 ENCOUNTER — Ambulatory Visit (INDEPENDENT_AMBULATORY_CARE_PROVIDER_SITE_OTHER): Payer: Managed Care, Other (non HMO) | Admitting: Internal Medicine

## 2017-01-14 ENCOUNTER — Encounter: Payer: Self-pay | Admitting: Internal Medicine

## 2017-01-14 VITALS — BP 122/81 | HR 91 | Resp 16 | Ht 64.0 in | Wt 280.0 lb

## 2017-01-14 DIAGNOSIS — R7309 Other abnormal glucose: Secondary | ICD-10-CM | POA: Diagnosis not present

## 2017-01-14 DIAGNOSIS — Z23 Encounter for immunization: Secondary | ICD-10-CM

## 2017-01-14 DIAGNOSIS — Z1239 Encounter for other screening for malignant neoplasm of breast: Secondary | ICD-10-CM

## 2017-01-14 DIAGNOSIS — R739 Hyperglycemia, unspecified: Secondary | ICD-10-CM

## 2017-01-14 DIAGNOSIS — I10 Essential (primary) hypertension: Secondary | ICD-10-CM

## 2017-01-14 DIAGNOSIS — Z0000 Encounter for general adult medical examination without abnormal findings: Secondary | ICD-10-CM | POA: Diagnosis not present

## 2017-01-14 DIAGNOSIS — N938 Other specified abnormal uterine and vaginal bleeding: Secondary | ICD-10-CM | POA: Diagnosis not present

## 2017-01-14 DIAGNOSIS — Z1211 Encounter for screening for malignant neoplasm of colon: Secondary | ICD-10-CM

## 2017-01-14 LAB — POCT URINALYSIS DIPSTICK
BILIRUBIN UA: NEGATIVE
GLUCOSE UA: NEGATIVE
Ketones, UA: NEGATIVE
Leukocytes, UA: NEGATIVE
NITRITE UA: NEGATIVE
PH UA: 6.5 (ref 5.0–8.0)
Protein, UA: NEGATIVE
Spec Grav, UA: 1.01 (ref 1.010–1.025)
Urobilinogen, UA: 0.2 E.U./dL

## 2017-01-14 MED ORDER — AMLODIPINE BESYLATE 5 MG PO TABS: 5.0000 mg | ORAL_TABLET | Freq: Every day | ORAL | 3 refills | Status: DC

## 2017-01-14 NOTE — Progress Notes (Signed)
Date:  01/14/2017   Name:  Anita Krueger   DOB:  1960/05/21   MRN:  [id number]   Chief Complaint: Annual Exam Anita Krueger is a 56 y.o. female who presents today for her Complete Annual Exam. She feels fairly well. She reports exercising stretching and walking steps. She reports she is sleeping fairly well. She denies breast complaints.  Mammogram last month was normal. She does not want to take cholesterol medications.  She declines colonoscopy.  Hypertension  This is a chronic problem. The problem is controlled. Pertinent negatives include no chest pain, headaches, palpitations or shortness of breath. Risk factors for coronary artery disease include dyslipidemia. Past treatments include angiotensin blockers, calcium channel blockers and diuretics. The current treatment provides significant improvement. There are no compliance problems.   Hyperlipidemia  This is a chronic problem. The problem is uncontrolled. Pertinent negatives include no chest pain or shortness of breath.  She tried lipitor but it caused joint pain so she stopped it. Lab Results  Component Value Date   CHOL 202 (H) 04/04/2015   HDL 45 04/04/2015   LDLCALC 131 (H) 04/04/2015   TRIG 130 04/04/2015   CHOLHDL 4.5 (H) 04/04/2015       Review of Systems  Constitutional: Negative for chills, fatigue and fever.  HENT: Negative for congestion, ear discharge, ear pain, hearing loss, tinnitus, trouble swallowing and voice change.   Eyes: Negative for visual disturbance.  Respiratory: Negative for cough, chest tightness, shortness of breath and wheezing.   Cardiovascular: Negative for chest pain, palpitations and leg swelling.  Gastrointestinal: Negative for abdominal pain, constipation, diarrhea and vomiting.  Endocrine: Negative for polydipsia and polyuria.  Genitourinary: Positive for menstrual problem (still having menses but irregular) and vaginal bleeding. Negative for dysuria, frequency, genital sores and vaginal  discharge.  Musculoskeletal: Positive for arthralgias (hip pain improving). Negative for gait problem and joint swelling.  Skin: Negative for color change and rash.  Neurological: Negative for dizziness, tremors, light-headedness and headaches.  Hematological: Negative for adenopathy. Does not bruise/bleed easily.  Psychiatric/Behavioral: Negative for dysphoric mood and sleep disturbance. The patient is not nervous/anxious.     Patient Active Problem List   Diagnosis Date Noted  . Hip pain, acute, left 05/12/2016  . Essential hypertension 04/04/2015  . Elevated random blood glucose level 01/27/2015  . Hyperlipidemia, mixed 09/29/2014    Prior to Admission medications   Medication Sig Start Date End Date Taking? Authorizing Provider  amLODipine (NORVASC) 5 MG tablet TAKE 1 TABLET BY MOUTH  DAILY 05/11/16   Glean Hess, MD  cyclobenzaprine (FLEXERIL) 10 MG tablet Take 1 tablet (10 mg total) by mouth 2 (two) times daily as needed for muscle spasms. 04/21/16   Harrie Foreman, MD  losartan-hydrochlorothiazide [facility]) 100-25 MG tablet TAKE 1 TABLET BY MOUTH  DAILY 12/27/16   Glean Hess, MD    No Known Allergies  Past Surgical History:  Procedure Laterality Date  . NO PAST SURGERIES      Social History   Tobacco Use  . Smoking status: Never Smoker  . Smokeless tobacco: Never Used  Substance Use Topics  . Alcohol use: Yes    Alcohol/week: 1.2 oz    Types: 2 Standard drinks or equivalent per week    Comment: occasional  . Drug use: No     Medication list has been reviewed and updated.  PHQ 2/9 Scores 01/14/2017  PHQ - 2 Score 0    Physical Exam  Constitutional:  She is oriented to person, place, and time. She appears well-developed and well-nourished. No distress.  HENT:  Head: Normocephalic and atraumatic.  Right Ear: Tympanic membrane and ear canal normal.  Left Ear: Tympanic membrane and ear canal normal.  Nose: Right sinus exhibits no maxillary sinus  tenderness. Left sinus exhibits no maxillary sinus tenderness.  Mouth/Throat: Uvula is midline and oropharynx is clear and moist.  Eyes: Conjunctivae and EOM are normal. Right eye exhibits no discharge. Left eye exhibits no discharge. No scleral icterus.  Neck: Normal range of motion. Carotid bruit is not present. No erythema present. No thyromegaly present.  Cardiovascular: Normal rate, regular rhythm, normal heart sounds and normal pulses.  Pulmonary/Chest: Effort normal. No respiratory distress. She has no wheezes. Right breast exhibits no mass, no nipple discharge, no skin change and no tenderness. Left breast exhibits no mass, no nipple discharge, no skin change and no tenderness.  Abdominal: Soft. Bowel sounds are normal. There is no hepatosplenomegaly. There is no tenderness. There is no CVA tenderness.  Genitourinary: Vagina normal and uterus normal. There is no tenderness, lesion or injury on the right labia. There is no tenderness, lesion or injury on the left labia. Cervix exhibits no motion tenderness. Right adnexum displays no mass, no tenderness and no fullness. Left adnexum displays no mass, no tenderness and no fullness. No bleeding in the vagina.  Musculoskeletal: Normal range of motion.  Lymphadenopathy:    She has no cervical adenopathy.    She has no axillary adenopathy.  Neurological: She is alert and oriented to person, place, and time. She has normal reflexes. No cranial nerve deficit or sensory deficit.  Skin: Skin is warm, dry and intact. No rash noted.  Psychiatric: She has a normal mood and affect. Her speech is normal and behavior is normal. Thought content normal.  Nursing note and vitals reviewed.   BP 122/81   Pulse 91   Resp 16   Ht 5\' 4"  (1.626 m)   Wt 280 lb (127 kg)   SpO2 98%   BMI 48.06 kg/m   Assessment and Plan: 1. Annual physical exam Mammogram completed Encouraged regular exercise and health diet Pt declines cholesterol medication but would  consider another statin is lipids are worsening - Hemoglobin A1c - Lipid panel  2. Breast cancer screening Normal exam  3. Essential hypertension controlled - Comprehensive metabolic panel - POCT urinalysis dipstick - TSH - amLODipine (NORVASC) 5 MG tablet; Take 1 tablet (5 mg total) by mouth daily.  Dispense: 90 tablet; Refill: 3  4. Elevated random blood glucose level Check A1C  5. Colon cancer screening Pt declines at this time  6. Dysfunctional uterine bleeding Refer to GYN - CBC with Differential/Platelet - Pap IG and HPV (high risk) DNA detection - Ambulatory referral to Obstetrics / Gynecology   Meds ordered this encounter  Medications  . amLODipine (NORVASC) 5 MG tablet    Sig: Take 1 tablet (5 mg total) by mouth daily.    Dispense:  90 tablet    Refill:  3    Partially dictated using Editor, commissioning. Any errors are unintentional.  Halina Maidens, MD [facility]  01/14/2017

## 2017-01-14 NOTE — Patient Instructions (Signed)
I have referred you to [facility].  Someone from that office should be calling you soon.

## 2017-01-15 LAB — HEMOGLOBIN A1C
Est. average glucose Bld gHb Est-mCnc: 192 mg/dL
HEMOGLOBIN A1C: 8.3 % — AB (ref 4.8–5.6)

## 2017-01-15 LAB — CBC WITH DIFFERENTIAL/PLATELET
BASOS: 0 %
Basophils Absolute: 0 10*3/uL (ref 0.0–0.2)
EOS (ABSOLUTE): 0.2 10*3/uL (ref 0.0–0.4)
EOS: 2 %
HEMATOCRIT: 36.3 % (ref 34.0–46.6)
HEMOGLOBIN: 11.2 g/dL (ref 11.1–15.9)
Immature Grans (Abs): 0 10*3/uL (ref 0.0–0.1)
Immature Granulocytes: 0 %
LYMPHS ABS: 2.5 10*3/uL (ref 0.7–3.1)
Lymphs: 26 %
MCH: 24.1 pg — AB (ref 26.6–33.0)
MCHC: 30.9 g/dL — AB (ref 31.5–35.7)
MCV: 78 fL — AB (ref 79–97)
MONOCYTES: 6 %
Monocytes Absolute: 0.5 10*3/uL (ref 0.1–0.9)
NEUTROS ABS: 6.4 10*3/uL (ref 1.4–7.0)
Neutrophils: 66 %
Platelets: 297 10*3/uL (ref 150–379)
RBC: 4.64 x10E6/uL (ref 3.77–5.28)
RDW: 17.3 % — ABNORMAL HIGH (ref 12.3–15.4)
WBC: 9.6 10*3/uL (ref 3.4–10.8)

## 2017-01-15 LAB — COMPREHENSIVE METABOLIC PANEL
A/G RATIO: 1.6 (ref 1.2–2.2)
ALBUMIN: 4.4 g/dL (ref 3.5–5.5)
ALK PHOS: 104 IU/L (ref 39–117)
ALT: 46 IU/L — ABNORMAL HIGH (ref 0–32)
AST: 44 IU/L — ABNORMAL HIGH (ref 0–40)
BUN / CREAT RATIO: 25 — AB (ref 9–23)
BUN: 17 mg/dL (ref 6–24)
Bilirubin Total: 0.3 mg/dL (ref 0.0–1.2)
CO2: 26 mmol/L (ref 20–29)
Calcium: 9.4 mg/dL (ref 8.7–10.2)
Chloride: 101 mmol/L (ref 96–106)
Creatinine, Ser: 0.68 mg/dL (ref 0.57–1.00)
GFR calc Af Amer: 114 mL/min/{1.73_m2} (ref >59)
GFR calc non Af Amer: 99 mL/min/{1.73_m2} (ref >59)
GLOBULIN, TOTAL: 2.8 g/dL (ref 1.5–4.5)
Glucose: 172 mg/dL — ABNORMAL HIGH (ref 65–99)
Potassium: 4.2 mmol/L (ref 3.5–5.2)
SODIUM: 141 mmol/L (ref 134–144)
Total Protein: 7.2 g/dL (ref 6.0–8.5)

## 2017-01-15 LAB — LIPID PANEL
CHOL/HDL RATIO: 5.6 ratio — AB (ref 0.0–4.4)
Cholesterol, Total: 192 mg/dL (ref 100–199)
HDL: 34 mg/dL — AB (ref >39)
LDL Calculated: 129 mg/dL — ABNORMAL HIGH (ref 0–99)
Triglycerides: 143 mg/dL (ref 0–149)
VLDL CHOLESTEROL CAL: 29 mg/dL (ref 5–40)

## 2017-01-15 LAB — TSH: TSH: 1.2 u[IU]/mL (ref 0.450–4.500)

## 2017-01-18 ENCOUNTER — Other Ambulatory Visit: Payer: Self-pay | Admitting: Internal Medicine

## 2017-01-18 DIAGNOSIS — E1165 Type 2 diabetes mellitus with hyperglycemia: Secondary | ICD-10-CM

## 2017-01-18 MED ORDER — METFORMIN HCL 500 MG PO TABS: 500.0000 mg | ORAL_TABLET | Freq: Two times a day (BID) | ORAL | 0 refills | Status: DC

## 2017-01-19 ENCOUNTER — Encounter: Payer: Self-pay | Admitting: Obstetrics and Gynecology

## 2017-01-19 ENCOUNTER — Ambulatory Visit (INDEPENDENT_AMBULATORY_CARE_PROVIDER_SITE_OTHER): Payer: Managed Care, Other (non HMO) | Admitting: Obstetrics and Gynecology

## 2017-01-19 VITALS — BP 132/88 | Ht 65.0 in | Wt 280.0 lb

## 2017-01-19 DIAGNOSIS — N921 Excessive and frequent menstruation with irregular cycle: Secondary | ICD-10-CM

## 2017-01-19 HISTORY — DX: Excessive and frequent menstruation with irregular cycle: N92.1

## 2017-01-19 NOTE — Progress Notes (Signed)
Obstetrics & Gynecology Office Visit   Chief Complaint  Patient presents with  . Menstrual Problem  Referral from Dr. Halina Maidens, MD from [facility]  History of Present Illness: 56 y.o. G2P0020 female who is seen in referral from Dr. Halina Maidens, as noted above for abnormal uterine bleeding.  According to the patient she has never gone a full year with no menses. However, she did go for about six or more months with no vaginal bleeding. Following this she began spotting.  Her menses are now heavy and unpredictable.  Sometimes she passes clots, other times there is just pink mucus.  She has gained weight. She does note early satiety and bloating.  She denies new constipation.  She does note hot flushes.  She has tried nothing for these symptoms. Nothing seem to make them better or worse. She has had a recent pap smear, which has not returned yet. She states that she has a distant history of abnormal pap smear, but her most recent pap smear was normal.  She denies a history of STIs.    Past Medical History:  Diagnosis Date  . Diabetes mellitus without complication ([place])   . Hyperlipidemia   . Hypertension     Past Surgical History:  Procedure Laterality Date  . LAPAROSCOPY      Gynecologic History: Patient's last menstrual period was 01/13/2017.  Obstetric History: G2P0020  Family History  Problem Relation Age of Onset  . Hypertension Mother   . Diabetes Mother   . Hypertension Father   . Breast cancer Cousin        PAT COUSIN    Social History   Socioeconomic History  . Marital status: Single    Spouse name: Not on file  . Number of children: Not on file  . Years of education: Not on file  . Highest education level: Not on file  Social Needs  . Financial resource strain: Not on file  . Food insecurity - worry: Not on file  . Food insecurity - inability: Not on file  . Transportation needs - medical: Not on file  . Transportation needs - non-medical:  Not on file  Occupational History  . Not on file  Tobacco Use  . Smoking status: Never Smoker  . Smokeless tobacco: Never Used  Substance and Sexual Activity  . Alcohol use: Yes    Alcohol/week: 1.2 oz    Types: 2 Standard drinks or equivalent per week    Comment: occasional  . Drug use: No  . Sexual activity: Not Currently    Birth control/protection: None  Other Topics Concern  . Not on file  Social History Narrative  . Not on file   Allergies: No Known Allergies  Medications   Medication Sig Start Date End Date Taking? Authorizing Provider  amLODipine (NORVASC) 5 MG tablet Take 1 tablet (5 mg total) by mouth daily. 01/14/17   Glean Hess, MD  losartan-hydrochlorothiazide [facility]) 100-25 MG tablet TAKE 1 TABLET BY MOUTH  DAILY 12/27/16   Glean Hess, MD  metFORMIN (GLUCOPHAGE) 500 MG tablet Take 1 tablet (500 mg total) by mouth 2 (two) times daily with a meal. 01/18/17   Glean Hess, MD   Review of Systems  Constitutional: Negative for chills, diaphoresis, fever, malaise/fatigue and weight loss.  HENT: Negative.   Eyes: Negative.   Respiratory: Negative.   Cardiovascular: Negative.   Gastrointestinal: Negative.   Genitourinary: Negative.   Musculoskeletal: Negative.   Skin: Negative.  Neurological: Negative.  Negative for weakness.  Endo/Heme/Allergies: Negative.   Psychiatric/Behavioral: Negative.      Physical Exam BP 132/88   Ht 5\' 5"  (1.651 m)   Wt 280 lb (127 kg)   LMP 01/13/2017   BMI 46.59 kg/m  Patient's last menstrual period was 01/13/2017. Physical Exam  Constitutional: She is oriented to person, place, and time. She appears well-developed and well-nourished. No distress.  Genitourinary: Pelvic exam was performed with patient supine. There is no rash, tenderness or lesion on the right labia. There is no rash, tenderness or lesion on the left labia. There is bleeding in the vagina. Cervix does not exhibit motion tenderness, lesion,  friability or polyp.  Genitourinary Comments: No obvious vaginal/vulvar lesion. No obvious cervical lesions.  Bimanual exam extremely limited due to patient's body habitus  HENT:  Head: Normocephalic and atraumatic.  Eyes: EOM are normal. No scleral icterus.  Neck: Normal range of motion. Neck supple.  Cardiovascular: Normal rate and regular rhythm.  Pulmonary/Chest: Effort normal and breath sounds normal. No respiratory distress. She has no wheezes. She has no rales.  Abdominal: Soft. Bowel sounds are normal. She exhibits no distension and no mass. There is no tenderness. There is no rebound and no guarding.  Musculoskeletal: Normal range of motion. She exhibits no edema.  Neurological: She is alert and oriented to person, place, and time. No cranial nerve deficit.  Skin: Skin is warm and dry. No erythema.  Hirsute at anterior neck/chin area  Psychiatric: She has a normal mood and affect. Her behavior is normal. Judgment normal.    Endometrial Biopsy After discussion with the patient regarding her abnormal uterine bleeding I recommended that she proceed with an endometrial biopsy for further diagnosis. The risks, benefits, alternatives, and indications for an endometrial biopsy were discussed with the patient in detail. She understood the risks including infection, bleeding, cervical laceration and uterine perforation.  Verbal consent was obtained.   PROCEDURE NOTE:  [place] endometrial biopsy was performed using aseptic technique with iodine preparation.  The uterus was sounded to a length of 9 cm.  Adequate sampling was obtained with minimal blood loss.  The patient tolerated the procedure well.  Disposition will be pending pathology.  Female chaperone present for pelvic and breast  portions of the physical exam  Assessment: 56 y.o. G1P0020 female here for  1. Menorrhagia with irregular cycle      Plan: Problem List Items Addressed This Visit    Menorrhagia with irregular cycle -  Primary   Relevant Orders   US PELVIS TRANSVANGINAL NON-OB (TV ONLY)   Pathology   Follicle stimulating hormone     Discussed various potential etiologies including structural and nonstructural.  Performed endometrial biopsy today.  Will assess FSH today to see if menopausal status is obvious.  Pelvic ultrasound for further workup.  Pap smear is pending already.  Treatment will depend upon further workup.  Discussed that most serious potential cause of her bleeding could be cancer and that it is very important that she continue her workup.  Prentice Docker, MD 01/19/2017 1:08 PM     CC: Glean Hess, MD [address] [place], [place] [address]

## 2017-01-20 LAB — PAP IG AND HPV HIGH-RISK
HPV, HIGH-RISK: NEGATIVE
PAP Smear Comment: 0

## 2017-01-20 LAB — FOLLICLE STIMULATING HORMONE: FSH: 25.3 m[IU]/mL

## 2017-01-21 ENCOUNTER — Telehealth: Payer: Self-pay | Admitting: Obstetrics and Gynecology

## 2017-01-21 LAB — PATHOLOGY

## 2017-01-21 NOTE — Telephone Encounter (Signed)
Pt is calling to find out her labs results. Please advise CB# [phone]

## 2017-01-24 NOTE — Telephone Encounter (Signed)
Discussed FSH and endometrial biopsy results. Will wait for ultrasound result to put together an overall treatment plan. Patient voiced understanding of the information provided and stated that she understood the importance of keeping her follow up.

## 2017-01-28 ENCOUNTER — Telehealth: Payer: Self-pay

## 2017-01-28 ENCOUNTER — Other Ambulatory Visit: Payer: Self-pay | Admitting: Internal Medicine

## 2017-01-28 DIAGNOSIS — E119 Type 2 diabetes mellitus without complications: Secondary | ICD-10-CM

## 2017-01-28 MED ORDER — BLOOD GLUCOSE MONITOR KIT: PACK | 0 refills | Status: DC

## 2017-01-28 NOTE — Telephone Encounter (Signed)
Patient called stating she was just prescribed new diabetes medications. Would like to start testing her BS and would like rx for glucometer and test strips so insurance can pay for it.   Please Advise.

## 2017-02-04 ENCOUNTER — Encounter: Payer: Self-pay | Admitting: Obstetrics and Gynecology

## 2017-02-04 ENCOUNTER — Ambulatory Visit (INDEPENDENT_AMBULATORY_CARE_PROVIDER_SITE_OTHER): Payer: Managed Care, Other (non HMO) | Admitting: Obstetrics and Gynecology

## 2017-02-04 ENCOUNTER — Ambulatory Visit (INDEPENDENT_AMBULATORY_CARE_PROVIDER_SITE_OTHER): Payer: Managed Care, Other (non HMO)

## 2017-02-04 VITALS — BP 148/98 | Ht 65.0 in | Wt 280.0 lb

## 2017-02-04 DIAGNOSIS — N921 Excessive and frequent menstruation with irregular cycle: Secondary | ICD-10-CM

## 2017-02-04 DIAGNOSIS — N8501 Benign endometrial hyperplasia: Secondary | ICD-10-CM | POA: Diagnosis not present

## 2017-02-04 DIAGNOSIS — R9389 Abnormal findings on diagnostic imaging of other specified body structures: Secondary | ICD-10-CM | POA: Diagnosis not present

## 2017-02-04 NOTE — Progress Notes (Signed)
Gynecology Ultrasound Follow Up   Chief Complaint  Patient presents with  . Follow-up  heavy vaginal bleeding, perimenopausal  History of Present Illness: Patient is a 56 y.o. female who presents today for ultrasound evaluation of the above .  I have personally reviewed the images and report for this ultrasound and my interpretation is reflected below.  Ultrasound demonstrates the following findings Adnexa: no masses seen  Uterus: anteverted with endometrial stripe 22.5 mm Additional: three intramural fibroids (largest 4.3 x 3.5 cm)  Recent pap smear: normal, HPV negative Endometrial Biopsy: simple and complex hyperplasia without atypia. FSH: 25.3  Past Medical History:  Diagnosis Date  . Diabetes mellitus without complication ([place])   . Hyperlipidemia   . Hypertension     Past Surgical History:  Procedure Laterality Date  . LAPAROSCOPY     Family History  Problem Relation Age of Onset  . Hypertension Mother   . Diabetes Mother   . Hypertension Father   . Breast cancer Cousin        PAT COUSIN   Social History   Socioeconomic History  . Marital status: Single    Spouse name: Not on file  . Number of children: Not on file  . Years of education: Not on file  . Highest education level: Not on file  Social Needs  . Financial resource strain: Not on file  . Food insecurity - worry: Not on file  . Food insecurity - inability: Not on file  . Transportation needs - medical: Not on file  . Transportation needs - non-medical: Not on file  Occupational History  . Not on file  Tobacco Use  . Smoking status: Never Smoker  . Smokeless tobacco: Never Used  Substance and Sexual Activity  . Alcohol use: Yes    Alcohol/week: 1.2 oz    Types: 2 Standard drinks or equivalent per week    Comment: occasional  . Drug use: No  . Sexual activity: Not Currently    Birth control/protection: None  Other Topics Concern  . Not on file  Social History Narrative  . Not on file    Allergies: No Known Allergies  Medications   Medication Sig Start Date End Date Taking? Authorizing Provider  amLODipine (NORVASC) 5 MG tablet Take 1 tablet (5 mg total) by mouth daily. 01/14/17   Glean Hess, MD  blood glucose meter kit and supplies KIT Dispense based on patient and insurance preference. Use up to four times daily as directed. (FOR ICD-9 250.00, 250.01). 01/28/17   Glean Hess, MD  losartan-hydrochlorothiazide [facility]) 100-25 MG tablet TAKE 1 TABLET BY MOUTH  DAILY 12/27/16   Glean Hess, MD  metFORMIN (GLUCOPHAGE) 500 MG tablet Take 1 tablet (500 mg total) by mouth 2 (two) times daily with a meal. 01/18/17   Glean Hess, MD    Physical Exam BP (!) 148/98   Ht 5' 5"  (1.651 m)   Wt 280 lb (127 kg)   LMP 01/13/2017   BMI 46.59 kg/m    General: NAD HEENT: normocephalic, anicteric CV: RRR Pulmonary: CTAB Extremities: no edema, erythema, or tenderness Neurologic: Grossly intact, normal gait Psychiatric: mood appropriate, affect full   Assessment: 56 y.o. G2P0020 here for  1. Menorrhagia with irregular cycle   2. Complex endometrial hyperplasia without atypia     Plan: Problem List Items Addressed This Visit    Menorrhagia with irregular cycle - Primary   Complex endometrial hyperplasia without atypia     Reviewed  ultrasound findings and discussed all findings from her workup. Giver her simple and complex endometrial hyperplasia without atypia, I would normally recommend progesterone therapy. However, her endometrial stripe is quite thickened and she has an uncertain menopausal status. Given that, I recommend evaluating her endometrium with hysteroscopy, D&C and treat according to those results.  Any atypia and I would recommend hysterectomy.    20 minutes spent in face to face discussion with > 50% spent in counseling, management, and coordination of care of her menorrhagia versus postmenopausal bleeding. Marland Kitchen   Prentice Docker,  MD 02/04/2017 5:20 PM    CC: Glean Hess, MD [address] [place], [place] [address]

## 2017-02-07 ENCOUNTER — Encounter: Payer: Managed Care, Other (non HMO) | Attending: Internal Medicine | Admitting: Dietician

## 2017-02-07 ENCOUNTER — Encounter: Payer: Self-pay | Admitting: Dietician

## 2017-02-07 VITALS — BP 154/92 | Ht 65.0 in | Wt 281.0 lb

## 2017-02-07 DIAGNOSIS — E1165 Type 2 diabetes mellitus with hyperglycemia: Secondary | ICD-10-CM | POA: Insufficient documentation

## 2017-02-07 DIAGNOSIS — Z713 Dietary counseling and surveillance: Secondary | ICD-10-CM | POA: Diagnosis present

## 2017-02-07 DIAGNOSIS — E119 Type 2 diabetes mellitus without complications: Secondary | ICD-10-CM

## 2017-02-07 NOTE — Progress Notes (Addendum)
Diabetes Self-Management Education  Visit Type: First/Initial  Appt. Start Time: 1600 Appt. End Time: 1700  02/07/2017  Anita Krueger, identified by name and date of birth, is a 56 y.o. female with a diagnosis of Diabetes: Type 2.   ASSESSMENT  Blood pressure (!) 154/92, height 5\' 5"  (1.651 m), weight 281 lb (127.5 kg), last menstrual period 01/13/2017. Body mass index is 46.76 kg/m.  Diabetes Self-Management Education - 02/07/17 1728      Visit Information   Visit Type  First/Initial      Initial Visit   Diabetes Type  Type 2      Health Coping   How would you rate your overall health?  Fair      Psychosocial Assessment   Patient Belief/Attitude about Diabetes  Motivated to manage diabetes    Self-care barriers  None    Self-management support  Family    Other persons present  Patient    Patient Concerns  Glycemic Control;Weight Control;Medication become more fit    Special Needs  None    Preferred Learning Style  Auditory;Visual;Hands on    Learning Readiness  Ready    What is the last grade level you completed in school?  MS-Business Leadership Management degree      Pre-Education Assessment   Patient understands the diabetes disease and treatment process.  Needs Instruction    Patient understands incorporating nutritional management into lifestyle.  Needs Instruction    Patient undertands incorporating physical activity into lifestyle.  Needs Instruction    Patient understands using medications safely.  Needs Instruction    Patient understands monitoring blood glucose, interpreting and using results  Needs Instruction    Patient understands prevention, detection, and treatment of acute complications.  Needs Instruction    Patient understands prevention, detection, and treatment of chronic complications.  Needs Instruction    Patient understands how to develop strategies to address psychosocial issues.  Needs Instruction    Patient understands how to develop  strategies to promote health/change behavior.  Needs Instruction      Complications   Last HgB A1C per patient/outside source  8.3 % 01-14-17    How often do you check your blood sugar?  0 times/day (not testing)    Have you had a dilated eye exam in the past 12 months?  Yes 07-2016    Have you had a dental exam in the past 12 months?  Yes 02-2016    Are you checking your feet?  No -reports some swelling of feet and ankles at times     Dietary Intake   Breakfast  eats breakfast at 8:30a    Snack (morning)  eats chips at 11a-12p    Lunch  eats lunch at 2p-eats sweets and fried foods 4-5x/wk    Snack (afternoon)  none    Dinner  eats supper at 6p-7p    Snack (evening)  eats chips , crackers or candy at 8:30p-9p and occasionally eats chips during night when gets up to bathroom    Beverage(s)  drink water 5-7x/day and diet soda 2-3x/day      Exercise   Exercise Type  Light (walking / raking leaves)    How many days per week to you exercise?  2.5    How many minutes per day do you exercise?  17    Total minutes per week of exercise  42.5      Patient Education   Previous Diabetes Education  No    Disease state  Definition of diabetes, type 1 and 2, and the diagnosis of diabetes;Factors that contribute to the development of diabetes    Nutrition management   Role of diet in the treatment of diabetes and the relationship between the three main macronutrients and blood glucose level;Food label reading, portion sizes and measuring food.;Carbohydrate counting    Physical activity and exercise   Role of exercise on diabetes management, blood pressure control and cardiac health.;Helped patient identify appropriate exercises in relation to his/her diabetes, diabetes complications and other health issue.    Medications  Reviewed patients medication for diabetes, action, purpose, timing of dose and side effects-takes Metformin 500 mg one tablet at lunch and supper-reports some GI symptoms since taking  Metformin    Monitoring  Taught/evaluated SMBG meter.;Identified appropriate SMBG and/or A1C goals.;Purpose and frequency of SMBG.;Yearly dilated eye exam;Taught/discussed recording of test results and interpretation of SMBG.    Chronic complications  Relationship between chronic complications and blood glucose control;Dental care;Lipid levels, blood glucose control and heart disease;Retinopathy and reason for yearly dilated eye exams;Nephropathy, what it is, prevention of, the use of ACE, ARB's and early detection of through urine microalbumia.;Reviewed with patient heart disease, higher risk of, and prevention    Personal strategies to promote health  Lifestyle issues that need to be addressed for better diabetes care;Helped patient develop diabetes management plan for (enter comment)      Outcomes   Expected Outcomes  Demonstrated interest in learning. Expect positive outcomes       Individualized Plan for Diabetes Self-Management Training:   Learning Objective:  Patient will have a greater understanding of diabetes self-management. Patient education plan is to attend individual and/or group sessions per assessed needs and concerns.   Plan:   Patient Instructions   Check blood sugars 2 x day before breakfast and 2 hrs after supper every day and record Bring blood sugar records to the next appointment/class Exercise:  walk for    15-20 minutes   3-4  days a week Eat 3 meals day,   1  snack a day at bedtime Eat 2-3 carbohydrate servings/meal + protein Eat 1 carbohydrate serving/snack + protein Space meals 4-6 hours apart Avoid sugar sweetened drinks (soda, tea, coffee, sports drinks, juices) Limit intake of sweets, fried foods and snack foods Make healthy food choices Make a dentist appointment Get a Sharps container Take Metformin with breakfast and supper Return for appointment/classes on:  02-14-17   Expected Outcomes:  Demonstrated interest in learning. Expect positive  outcomes  Education material provided: General meal planning guidelines  If problems or questions, patient to contact team via:  [phone]  Future DSME appointment:  02-14-17  7:25 PM -BG's faxed to Dr. Paulita Cradle by Ronney Asters, RN, CDE

## 2017-02-07 NOTE — Patient Instructions (Signed)
  Check blood sugars 2 x day before breakfast and 2 hrs after supper every day and record Bring blood sugar records to the next appointment/class Exercise:  walk for    15-20 minutes   3-4  days a week Eat 3 meals day,   1  snack a day at bedtime Eat 2-3 carbohydrate servings/meal + protein Eat 1 carbohydrate serving/snack + protein Space meals 4-6 hours apart Avoid sugar sweetened drinks (soda, tea, coffee, sports drinks, juices) Limit intake of sweets, fried foods and snack foods Make healthy food choices Make a dentist appointment Get a Sharps container Take Metformin with breakfast and supper Return for appointment/classes on:  02-14-17

## 2017-02-09 ENCOUNTER — Telehealth: Payer: Self-pay | Admitting: Obstetrics and Gynecology

## 2017-02-09 NOTE — Telephone Encounter (Signed)
Patient was offered available OR dates in January, and chose 03/31/17. Patient is aware of H&P at [facility] on 03/18/17 @ 9:10am w/ Dr. Glennon Mac, Pre-admit Testing afterwards, and OR on 03/31/17. Patient is aware she may receive calls from the Pharmacy and [facility]. Ext given.

## 2017-02-09 NOTE — Telephone Encounter (Signed)
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

## 2017-02-14 ENCOUNTER — Encounter: Payer: Self-pay | Admitting: Dietician

## 2017-02-14 ENCOUNTER — Encounter: Payer: Managed Care, Other (non HMO) | Attending: Internal Medicine | Admitting: Dietician

## 2017-02-14 VITALS — Ht 65.0 in | Wt 278.3 lb

## 2017-02-14 DIAGNOSIS — Z713 Dietary counseling and surveillance: Secondary | ICD-10-CM | POA: Insufficient documentation

## 2017-02-14 DIAGNOSIS — E119 Type 2 diabetes mellitus without complications: Secondary | ICD-10-CM

## 2017-02-14 DIAGNOSIS — E1165 Type 2 diabetes mellitus with hyperglycemia: Secondary | ICD-10-CM | POA: Diagnosis not present

## 2017-02-14 NOTE — Progress Notes (Signed)

## 2017-02-18 ENCOUNTER — Telehealth: Payer: Self-pay | Admitting: Dietician

## 2017-02-18 NOTE — Telephone Encounter (Signed)
Called patient and left a message that at this time class 2 will be held as scheduled on 02/21/17 as winter weather and roads are expected to be safe by then. Encouraged patient to be safe and reschedule if she does not feel safe to drive to class.

## 2017-02-21 ENCOUNTER — Encounter: Payer: Self-pay | Admitting: Dietician

## 2017-02-21 ENCOUNTER — Other Ambulatory Visit: Payer: Managed Care, Other (non HMO)

## 2017-02-21 ENCOUNTER — Encounter: Payer: Managed Care, Other (non HMO) | Admitting: Dietician

## 2017-02-21 VITALS — Wt 276.6 lb

## 2017-02-21 DIAGNOSIS — E119 Type 2 diabetes mellitus without complications: Secondary | ICD-10-CM

## 2017-02-21 DIAGNOSIS — Z713 Dietary counseling and surveillance: Secondary | ICD-10-CM | POA: Diagnosis not present

## 2017-02-21 NOTE — Progress Notes (Signed)
Appt. Start Time: 1730 Appt. End Time: 2030  MD HAS NOT RESPONDED TO FAX SENT 02-14-17  Class 2 Nutritional Management - identify sources of carbohydrate, protein and fat; plan balanced meals; estimate servings of carbohydrates in meals  Psychosocial - identify DM as a source of stress; state the effects of stress on BG control  Exercise - describe the effects of exercise on blood glucose and importance of regular exercise in controlling diabetes; state a plan for personal exercise; verbalize contraindications for exercise  Self-Monitoring - state importance of SMBG; use SMBG results to effectively manage diabetes; identify importance of regular HbA1C testing and goals for results  Acute Complications - recognize hyperglycemia and hypoglycemia with causes and effects; identify blood glucose results as high, low or in control; list steps in treating and preventing high and low blood glucose  Sick Day Guidelines: state appropriate measure to manage blood glucose when ill (need for meds, HBGM plan, when to call physician, need for fluids)  Chronic Complications/Foot, Skin, Eye Dental Care - identify possible long-term complications of diabetes (retinopathy, neuropathy, nephropathy, cardiovascular disease, infections); explain steps in prevention and treatment of chronic complications; state importance of daily self-foot exams; describe how to examine feet and what to look for; explain appropriate eye and dental care  Lifestyle Changes/Goals - state benefits of making appropriate lifestyle changes; identify habits that need to change (meals, tobacco, alcohol); identify strategies to reduce risk factors for personal health  Pregnancy/Sexual Health - state importance of good blood glucose control in preventing sexual problems (impotence, vaginal dryness, infections, loss of desire)  Teaching Materials Used: Class 2 Slide Packet A1C Pamphlet Foot Care Literature Kidney Test Handout Stroke  Card Quick and "Balanced" Meal Ideas Carb Counting and Meal Planning Book Goals for Class 2

## 2017-02-28 ENCOUNTER — Encounter: Payer: Self-pay | Admitting: Dietician

## 2017-02-28 NOTE — Progress Notes (Signed)
Pt called and cancelled class 3 tonight due to being sick Rescheduled class 3 on 03-28-17

## 2017-03-08 ENCOUNTER — Other Ambulatory Visit: Payer: Self-pay | Admitting: Internal Medicine

## 2017-03-08 DIAGNOSIS — E1165 Type 2 diabetes mellitus with hyperglycemia: Secondary | ICD-10-CM

## 2017-03-10 LAB — HM DIABETES EYE EXAM

## 2017-03-11 ENCOUNTER — Other Ambulatory Visit: Payer: Self-pay | Admitting: Internal Medicine

## 2017-03-18 ENCOUNTER — Encounter: Payer: Self-pay | Admitting: Internal Medicine

## 2017-03-18 ENCOUNTER — Encounter: Payer: Managed Care, Other (non HMO) | Admitting: Obstetrics and Gynecology

## 2017-03-18 ENCOUNTER — Inpatient Hospital Stay: Admission: RE | Admit: 2017-03-18 | Payer: Managed Care, Other (non HMO) | Source: Ambulatory Visit

## 2017-03-21 ENCOUNTER — Telehealth: Payer: Self-pay | Admitting: Obstetrics and Gynecology

## 2017-03-21 NOTE — Telephone Encounter (Signed)
Patient no-showed her H&P and Pre-admit Testing appointments on Friday, so I attempted to contact the patient to reschedule. Lmtrc.

## 2017-03-22 NOTE — Telephone Encounter (Signed)
Patient returned the call, and is rescheduled for H&P at [facility] on 03/29/17 @ 8:10am w/ Dr. Glennon Mac, and Pre-admit Testing at [facility] is still 03/31/17.

## 2017-03-24 ENCOUNTER — Other Ambulatory Visit: Payer: Managed Care, Other (non HMO)

## 2017-03-28 ENCOUNTER — Encounter: Payer: Self-pay | Admitting: Dietician

## 2017-03-28 ENCOUNTER — Encounter: Payer: Managed Care, Other (non HMO) | Attending: Internal Medicine | Admitting: Dietician

## 2017-03-28 VITALS — BP 154/98 | Ht 65.0 in | Wt 276.6 lb

## 2017-03-28 DIAGNOSIS — Z713 Dietary counseling and surveillance: Secondary | ICD-10-CM | POA: Insufficient documentation

## 2017-03-28 DIAGNOSIS — E119 Type 2 diabetes mellitus without complications: Secondary | ICD-10-CM

## 2017-03-28 DIAGNOSIS — E1165 Type 2 diabetes mellitus with hyperglycemia: Secondary | ICD-10-CM | POA: Diagnosis not present

## 2017-03-28 NOTE — Progress Notes (Signed)

## 2017-03-29 ENCOUNTER — Ambulatory Visit (INDEPENDENT_AMBULATORY_CARE_PROVIDER_SITE_OTHER): Payer: Managed Care, Other (non HMO) | Admitting: Obstetrics and Gynecology

## 2017-03-29 ENCOUNTER — Other Ambulatory Visit: Payer: Self-pay

## 2017-03-29 ENCOUNTER — Encounter: Payer: Self-pay | Admitting: Obstetrics and Gynecology

## 2017-03-29 ENCOUNTER — Encounter
Admission: RE | Admit: 2017-03-29 | Discharge: 2017-03-29 | Disposition: A | Discharge status: Home / Self Care | Payer: Managed Care, Other (non HMO) | Source: Ambulatory Visit | Attending: Obstetrics and Gynecology | Admitting: Obstetrics and Gynecology

## 2017-03-29 VITALS — BP 136/94 | Ht 65.0 in | Wt 276.0 lb

## 2017-03-29 DIAGNOSIS — Z79899 Other long term (current) drug therapy: Secondary | ICD-10-CM | POA: Diagnosis not present

## 2017-03-29 DIAGNOSIS — Z87891 Personal history of nicotine dependence: Secondary | ICD-10-CM | POA: Diagnosis not present

## 2017-03-29 DIAGNOSIS — E785 Hyperlipidemia, unspecified: Secondary | ICD-10-CM | POA: Diagnosis not present

## 2017-03-29 DIAGNOSIS — Z6841 Body Mass Index (BMI) 40.0 and over, adult: Secondary | ICD-10-CM | POA: Diagnosis not present

## 2017-03-29 DIAGNOSIS — Z7984 Long term (current) use of oral hypoglycemic drugs: Secondary | ICD-10-CM | POA: Diagnosis not present

## 2017-03-29 DIAGNOSIS — N8501 Benign endometrial hyperplasia: Secondary | ICD-10-CM

## 2017-03-29 DIAGNOSIS — I10 Essential (primary) hypertension: Secondary | ICD-10-CM

## 2017-03-29 DIAGNOSIS — N921 Excessive and frequent menstruation with irregular cycle: Secondary | ICD-10-CM

## 2017-03-29 DIAGNOSIS — N920 Excessive and frequent menstruation with regular cycle: Secondary | ICD-10-CM | POA: Diagnosis present

## 2017-03-29 DIAGNOSIS — E119 Type 2 diabetes mellitus without complications: Secondary | ICD-10-CM | POA: Diagnosis not present

## 2017-03-29 HISTORY — DX: Bell's palsy: G51.0

## 2017-03-29 HISTORY — DX: Unspecified osteoarthritis, unspecified site: M19.90

## 2017-03-29 NOTE — Progress Notes (Signed)
Preoperative History and Physical  Anita Krueger is a 57 y.o. G2P0020 here for surgical management of abnormal uterine bleeding.   No significant preoperative concerns.  History of Present Illness: 57 y.o. G72P0020 female with a history of abnormal menstrual bleeding.  She had a pelvic ultrasound that showed several fibroids and she had an endometrial stripe of 22.5 mm.  An endometrial biopsy showed simple and focal complex hyperplasia without atypia.  She had a normal pap smear.  She presents for further surgical evaluation of her thickened endometrial stripe and pre-cancerous endometrial changes.   Proposed surgery: hysteroscopy, dilation and curettage  Past Medical History:  Diagnosis Date  . Diabetes mellitus without complication ([place])   . Hyperlipidemia   . Hypertension    Past Surgical History:  Procedure Laterality Date  . LAPAROSCOPY     OB History  Gravida Para Term Preterm AB Living  2       2    SAB TAB Ectopic Multiple Live Births               # Outcome Date GA Lbr Len/2nd Weight Sex Delivery Anes PTL Lv  2 AB           1 AB             Patient denies any other pertinent gynecologic issues.   Current Outpatient Medications on File Prior to Visit  Medication Sig Dispense Refill  . amLODipine (NORVASC) 5 MG tablet Take 1 tablet (5 mg total) by mouth daily. 90 tablet 3  . Carboxymethylcellul-Glycerin (CLEAR EYES FOR DRY EYES) 1-0.25 % SOLN Place 1 drop into both eyes 3 (three) times daily as needed (dry eyes).    . carboxymethylcellulose (REFRESH PLUS) 0.5 % SOLN Place 1 drop into both eyes 3 (three) times daily as needed (dry eyes).    Marland Kitchen loratadine (CLARITIN) 10 MG tablet Take 10 mg by mouth daily as needed for allergies.    Marland Kitchen losartan-hydrochlorothiazide (HYZAAR) 100-25 MG tablet TAKE 1 TABLET BY MOUTH  DAILY 90 tablet 1  . metFORMIN (GLUCOPHAGE) 500 MG tablet Take 500 mg by mouth 2 (two) times daily with a meal.    . Multiple Vitamins-Minerals (DAILY  MULTIVITAMIN PO) Take 2 tablets by mouth daily.    . ONE TOUCH ULTRA TEST test strip USE 1 STRIP UP TO 4 TIMES  DAILY AS DIRECTED 100 each 12  . ONETOUCH DELICA LANCETS FINE MISC USE 1 LANCET UP TO 4 TIMES  DAILY AS DIRECTED 100 each 12   No current facility-administered medications on file prior to visit.    Allergies: No Known Allergies  Social History:   reports that she has quit smoking. she has never used smokeless tobacco. She reports that she drinks about 1.2 oz of alcohol per week. She reports that she does not use drugs.  Family History  Problem Relation Age of Onset  . Hypertension Mother   . Diabetes Mother   . Hypertension Father   . Breast cancer Cousin        PAT COUSIN    Review of Systems:  Review of Systems  Constitutional: Negative.   HENT: Negative.   Eyes: Negative.   Respiratory: Negative.   Cardiovascular: Negative.   Gastrointestinal: Negative.   Genitourinary: Negative.   Musculoskeletal: Negative.   Skin: Negative.   Neurological: Negative.   Psychiatric/Behavioral: Negative.      PHYSICAL EXAM: Blood pressure (!) 136/94, height 5\' 5"  (1.651 m), weight 276 lb (125.2  kg). Physical Exam  Constitutional: She is oriented to person, place, and time. She appears well-developed and well-nourished. No distress.  HENT:  Head: Normocephalic and atraumatic.  Eyes: Conjunctivae are normal.  Neck: Normal range of motion. Neck supple. No thyromegaly present.  Cardiovascular: Normal rate, regular rhythm and normal heart sounds. Exam reveals no gallop and no friction rub.  No murmur heard. Pulmonary/Chest: Effort normal and breath sounds normal. She has no wheezes.  Abdominal: Soft. She exhibits no distension and no mass. There is no tenderness. There is no rebound and no guarding. No hernia.  Genitourinary: Pelvic exam was performed with patient supine.  Musculoskeletal: Normal range of motion.  Lymphadenopathy:       Right: No inguinal adenopathy present.         Left: No inguinal adenopathy present.  Neurological: She is alert and oriented to person, place, and time.  Skin: Skin is warm and dry. No rash noted.  Psychiatric: She has a normal mood and affect. Her behavior is normal.   Assessment: Patient Active Problem List   Diagnosis Date Noted  . 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 Menorrhagia with irregular cycle 01/19/2017    Plan: Patient will undergo surgical management with hysteroscopy, dilation and curettage.   The risks of surgery were discussed in detail with the patient including but not limited to: bleeding which may require transfusion or reoperation; infection which may require antibiotics; injury to surrounding organs which may involve bowel, bladder, ureters ; need for additional procedures including laparoscopy or laparotomy; thromboembolic phenomenon, surgical site problems and other postoperative/anesthesia complications. Likelihood of success in alleviating the patient's condition was discussed. Routine postoperative instructions will be reviewed with the patient and her family in detail after surgery.  The patient concurred with the proposed plan, giving informed written consent for the surgery.  Preoperative prophylactic antibiotics and SCDs ordered on call to the OR.    Prentice Docker, MD 03/29/2017 8:17 AM

## 2017-03-29 NOTE — H&P (View-Only) (Signed)
Preoperative History and Physical  Anita Krueger is a 57 y.o. G2P0020 here for surgical management of abnormal uterine bleeding.   No significant preoperative concerns.  History of Present Illness: 57 y.o. G77P0020 female with a history of abnormal menstrual bleeding.  She had a pelvic ultrasound that showed several fibroids and she had an endometrial stripe of 22.5 mm.  An endometrial biopsy showed simple and focal complex hyperplasia without atypia.  She had a normal pap smear.  She presents for further surgical evaluation of her thickened endometrial stripe and pre-cancerous endometrial changes.   Proposed surgery: hysteroscopy, dilation and curettage  Past Medical History:  Diagnosis Date  . Diabetes mellitus without complication ([place])   . Hyperlipidemia   . Hypertension    Past Surgical History:  Procedure Laterality Date  . LAPAROSCOPY     OB History  Gravida Para Term Preterm AB Living  2       2    SAB TAB Ectopic Multiple Live Births               # Outcome Date GA Lbr Len/2nd Weight Sex Delivery Anes PTL Lv  2 AB           1 AB             Patient denies any other pertinent gynecologic issues.   Current Outpatient Medications on File Prior to Visit  Medication Sig Dispense Refill  . amLODipine (NORVASC) 5 MG tablet Take 1 tablet (5 mg total) by mouth daily. 90 tablet 3  . Carboxymethylcellul-Glycerin (CLEAR EYES FOR DRY EYES) 1-0.25 % SOLN Place 1 drop into both eyes 3 (three) times daily as needed (dry eyes).    . carboxymethylcellulose (REFRESH PLUS) 0.5 % SOLN Place 1 drop into both eyes 3 (three) times daily as needed (dry eyes).    Marland Kitchen loratadine (CLARITIN) 10 MG tablet Take 10 mg by mouth daily as needed for allergies.    Marland Kitchen losartan-hydrochlorothiazide (HYZAAR) 100-25 MG tablet TAKE 1 TABLET BY MOUTH  DAILY 90 tablet 1  . metFORMIN (GLUCOPHAGE) 500 MG tablet Take 500 mg by mouth 2 (two) times daily with a meal.    . Multiple Vitamins-Minerals (DAILY  MULTIVITAMIN PO) Take 2 tablets by mouth daily.    . ONE TOUCH ULTRA TEST test strip USE 1 STRIP UP TO 4 TIMES  DAILY AS DIRECTED 100 each 12  . ONETOUCH DELICA LANCETS FINE MISC USE 1 LANCET UP TO 4 TIMES  DAILY AS DIRECTED 100 each 12   No current facility-administered medications on file prior to visit.    Allergies: No Known Allergies  Social History:   reports that she has quit smoking. she has never used smokeless tobacco. She reports that she drinks about 1.2 oz of alcohol per week. She reports that she does not use drugs.  Family History  Problem Relation Age of Onset  . Hypertension Mother   . Diabetes Mother   . Hypertension Father   . Breast cancer Cousin        PAT COUSIN    Review of Systems:  Review of Systems  Constitutional: Negative.   HENT: Negative.   Eyes: Negative.   Respiratory: Negative.   Cardiovascular: Negative.   Gastrointestinal: Negative.   Genitourinary: Negative.   Musculoskeletal: Negative.   Skin: Negative.   Neurological: Negative.   Psychiatric/Behavioral: Negative.      PHYSICAL EXAM: Blood pressure (!) 136/94, height 5\' 5"  (1.651 m), weight 276 lb (125.2  kg). Physical Exam  Constitutional: She is oriented to person, place, and time. She appears well-developed and well-nourished. No distress.  HENT:  Head: Normocephalic and atraumatic.  Eyes: Conjunctivae are normal.  Neck: Normal range of motion. Neck supple. No thyromegaly present.  Cardiovascular: Normal rate, regular rhythm and normal heart sounds. Exam reveals no gallop and no friction rub.  No murmur heard. Pulmonary/Chest: Effort normal and breath sounds normal. She has no wheezes.  Abdominal: Soft. She exhibits no distension and no mass. There is no tenderness. There is no rebound and no guarding. No hernia.  Genitourinary: Pelvic exam was performed with patient supine.  Musculoskeletal: Normal range of motion.  Lymphadenopathy:       Right: No inguinal adenopathy present.         Left: No inguinal adenopathy present.  Neurological: She is alert and oriented to person, place, and time.  Skin: Skin is warm and dry. No rash noted.  Psychiatric: She has a normal mood and affect. Her behavior is normal.   Assessment: Patient Active Problem List   Diagnosis Date Noted  . 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 Menorrhagia with irregular cycle 01/19/2017    Plan: Patient will undergo surgical management with hysteroscopy, dilation and curettage.   The risks of surgery were discussed in detail with the patient including but not limited to: bleeding which may require transfusion or reoperation; infection which may require antibiotics; injury to surrounding organs which may involve bowel, bladder, ureters ; need for additional procedures including laparoscopy or laparotomy; thromboembolic phenomenon, surgical site problems and other postoperative/anesthesia complications. Likelihood of success in alleviating the patient's condition was discussed. Routine postoperative instructions will be reviewed with the patient and her family in detail after surgery.  The patient concurred with the proposed plan, giving informed written consent for the surgery.  Preoperative prophylactic antibiotics and SCDs ordered on call to the OR.    Prentice Docker, MD 03/29/2017 8:17 AM

## 2017-03-29 NOTE — Patient Instructions (Signed)
Your procedure is scheduled on: Thurs. 03/31/17 Report to Day Surgery. To find out your arrival time please call [phone] between 1PM - 3PM on Tomorrow.  Remember: Instructions that are not followed completely may result in serious medical risk, up to and including death, or upon the discretion of your surgeon and anesthesiologist your surgery may need to be rescheduled.     _X__ 1. Do not eat food after midnight the night before your procedure.                 No gum chewing or hard candies. You may drink clear liquids up to 2 hours                 before you are scheduled to arrive for your surgery- DO not drink clear                 liquids within 2 hours of the start of your surgery.                  water __X__2.  On the morning of surgery brush your teeth with toothpaste and water, you may rinse your mouth with mouthwash if you wish.  Do not swallow any              toothpaste of mouthwash.     _X__ 3.  No Alcohol for 24 hours before or after surgery.   ___ 4.  Do Not Smoke or use e-cigarettes For 24 Hours Prior to Your Surgery.                 Do not use any chewable tobacco products for at least 6 hours prior to                 surgery.  ____  5.  Bring all medications with you on the day of surgery if instructed.   __x__  6.  Notify your doctor if there is any change in your medical condition      (cold, fever, infections).     Do not wear jewelry, make-up, hairpins, clips or nail polish. Do not wear lotions, powders, or perfumes. You may wear deodorant. Do not shave 48 hours prior to surgery. Men may shave face and neck. Do not bring valuables to the hospital.    [facility] is not responsible for any belongings or valuables.  Contacts, dentures or bridgework may not be worn into surgery. Leave your suitcase in the car. After surgery it may be brought to your room. For patients admitted to the hospital, discharge time is determined by  your treatment team.   Patients discharged the day of surgery will not be allowed to drive home.   Please read over the following fact sheets that you were given:    __x__ Take these medicines the morning of surgery with A SIP OF WATER:    1. loratadine (CLARITIN) 10 MG tablet  2. Tylenol if needed  3.   4.  5.  6.  ____ Fleet Enema (as directed)   ____ Use CHG Soap as directed  ____ Use inhalers on the day of surgery  __x__ Stop metformin 2 days prior to surgery ( today)    ____ Take 1/2 of usual insulin dose the night before surgery. No insulin the morning          of surgery.   ____ Stop Coumadin/Plavix/aspirin on   __x__ Stop Anti-inflammatories  ibuprofen (ADVIL,MOTRIN) 400 MG tablet today  ____ Stop supplements until after surgery.    ____ Bring C-Pap to the hospital.

## 2017-03-31 ENCOUNTER — Encounter: Payer: Self-pay | Admitting: Anesthesiology

## 2017-03-31 ENCOUNTER — Encounter
Admission: RE | Disposition: A | Discharge status: Home / Self Care | Payer: Self-pay | Source: Ambulatory Visit | Attending: Obstetrics and Gynecology

## 2017-03-31 ENCOUNTER — Ambulatory Visit: Payer: Managed Care, Other (non HMO) | Admitting: Certified Registered"

## 2017-03-31 ENCOUNTER — Ambulatory Visit
Admission: RE | Admit: 2017-03-31 | Discharge: 2017-03-31 | Disposition: A | Discharge status: Home / Self Care | Payer: Managed Care, Other (non HMO) | Source: Ambulatory Visit | Attending: Obstetrics and Gynecology | Admitting: Obstetrics and Gynecology

## 2017-03-31 DIAGNOSIS — Z87891 Personal history of nicotine dependence: Secondary | ICD-10-CM | POA: Insufficient documentation

## 2017-03-31 DIAGNOSIS — E785 Hyperlipidemia, unspecified: Secondary | ICD-10-CM | POA: Insufficient documentation

## 2017-03-31 DIAGNOSIS — Z6841 Body Mass Index (BMI) 40.0 and over, adult: Secondary | ICD-10-CM | POA: Insufficient documentation

## 2017-03-31 DIAGNOSIS — I10 Essential (primary) hypertension: Secondary | ICD-10-CM | POA: Insufficient documentation

## 2017-03-31 DIAGNOSIS — N921 Excessive and frequent menstruation with irregular cycle: Secondary | ICD-10-CM

## 2017-03-31 DIAGNOSIS — Z7984 Long term (current) use of oral hypoglycemic drugs: Secondary | ICD-10-CM | POA: Insufficient documentation

## 2017-03-31 DIAGNOSIS — N8501 Benign endometrial hyperplasia: Secondary | ICD-10-CM | POA: Diagnosis not present

## 2017-03-31 DIAGNOSIS — Z79899 Other long term (current) drug therapy: Secondary | ICD-10-CM | POA: Insufficient documentation

## 2017-03-31 DIAGNOSIS — N920 Excessive and frequent menstruation with regular cycle: Secondary | ICD-10-CM | POA: Insufficient documentation

## 2017-03-31 DIAGNOSIS — E119 Type 2 diabetes mellitus without complications: Secondary | ICD-10-CM | POA: Insufficient documentation

## 2017-03-31 HISTORY — PX: DILATATION & CURETTAGE/HYSTEROSCOPY WITH MYOSURE: SHX6511

## 2017-03-31 LAB — GLUCOSE, CAPILLARY: Glucose-Capillary: 129 mg/dL — ABNORMAL HIGH (ref 65–99)

## 2017-03-31 LAB — POCT PREGNANCY, URINE: Preg Test, Ur: NEGATIVE

## 2017-03-31 SURGERY — DILATATION & CURETTAGE/HYSTEROSCOPY WITH MYOSURE
Anesthesia: General | Site: Uterus | Wound class: Clean Contaminated

## 2017-03-31 MED ORDER — LIDOCAINE HCL (CARDIAC) 20 MG/ML IV SOLN
INTRAVENOUS | Status: DC | PRN
  Administered 2017-03-31: 80 mg via INTRAVENOUS

## 2017-03-31 MED ORDER — FENTANYL CITRATE (PF) 100 MCG/2ML IJ SOLN
INTRAMUSCULAR | Status: DC | PRN
  Administered 2017-03-31 (×2): 50 ug via INTRAVENOUS

## 2017-03-31 MED ORDER — FENTANYL CITRATE (PF) 100 MCG/2ML IJ SOLN: 25.0000 ug | INTRAMUSCULAR | Status: DC | PRN

## 2017-03-31 MED ORDER — ONDANSETRON HCL 4 MG/2ML IJ SOLN
INTRAMUSCULAR | Status: DC | PRN
  Administered 2017-03-31: 4 mg via INTRAVENOUS

## 2017-03-31 MED ORDER — MIDAZOLAM HCL 2 MG/2ML IJ SOLN
INTRAMUSCULAR | Status: AC
Start: 2017-03-31 — End: ?
  Filled 2017-03-31: qty 2

## 2017-03-31 MED ORDER — IPRATROPIUM-ALBUTEROL 0.5-2.5 (3) MG/3ML IN SOLN
RESPIRATORY_TRACT | Status: AC
  Administered 2017-03-31: 3 mL via RESPIRATORY_TRACT
  Filled 2017-03-31: qty 3

## 2017-03-31 MED ORDER — IBUPROFEN 600 MG PO TABS: 600.0000 mg | ORAL_TABLET | Freq: Four times a day (QID) | ORAL | 0 refills | Status: DC | PRN

## 2017-03-31 MED ORDER — FAMOTIDINE 20 MG PO TABS
20.0000 mg | ORAL_TABLET | Freq: Once | ORAL | Status: AC
  Administered 2017-03-31: 20 mg via ORAL

## 2017-03-31 MED ORDER — FUROSEMIDE 10 MG/ML IJ SOLN
10.0000 mg | Freq: Once | INTRAMUSCULAR | Status: AC
  Administered 2017-03-31: 10 mg via INTRAVENOUS

## 2017-03-31 MED ORDER — ONDANSETRON HCL 4 MG/2ML IJ SOLN
INTRAMUSCULAR | Status: AC
  Filled 2017-03-31: qty 2

## 2017-03-31 MED ORDER — ONDANSETRON HCL 4 MG/2ML IJ SOLN: 4.0000 mg | Freq: Once | INTRAMUSCULAR | Status: DC | PRN

## 2017-03-31 MED ORDER — FAMOTIDINE 20 MG PO TABS
ORAL_TABLET | ORAL | Status: AC
  Administered 2017-03-31: 20 mg via ORAL
  Filled 2017-03-31: qty 1

## 2017-03-31 MED ORDER — METHYLPREDNISOLONE SODIUM SUCC 125 MG IJ SOLR
INTRAMUSCULAR | Status: DC | PRN
  Administered 2017-03-31: 125 mg via INTRAVENOUS

## 2017-03-31 MED ORDER — SUGAMMADEX SODIUM 500 MG/5ML IV SOLN
INTRAVENOUS | Status: AC
  Filled 2017-03-31: qty 5

## 2017-03-31 MED ORDER — MIDAZOLAM HCL 2 MG/2ML IJ SOLN
INTRAMUSCULAR | Status: DC | PRN
  Administered 2017-03-31: 2 mg via INTRAVENOUS

## 2017-03-31 MED ORDER — PROPOFOL 10 MG/ML IV BOLUS
INTRAVENOUS | Status: DC | PRN
  Administered 2017-03-31: 150 mg via INTRAVENOUS
  Administered 2017-03-31: 30 mg via INTRAVENOUS

## 2017-03-31 MED ORDER — DEXAMETHASONE SODIUM PHOSPHATE 10 MG/ML IJ SOLN
INTRAMUSCULAR | Status: AC
Start: 2017-03-31 — End: ?
  Filled 2017-03-31: qty 1

## 2017-03-31 MED ORDER — ALBUTEROL SULFATE HFA 108 (90 BASE) MCG/ACT IN AERS
INHALATION_SPRAY | RESPIRATORY_TRACT | Status: DC | PRN
  Administered 2017-03-31: 6 via RESPIRATORY_TRACT

## 2017-03-31 MED ORDER — HYDROCODONE-ACETAMINOPHEN 5-325 MG PO TABS: 1.0000 | ORAL_TABLET | Freq: Four times a day (QID) | ORAL | 0 refills | Status: DC | PRN

## 2017-03-31 MED ORDER — ROCURONIUM BROMIDE 100 MG/10ML IV SOLN
INTRAVENOUS | Status: DC | PRN
  Administered 2017-03-31: 15 mg via INTRAVENOUS
  Administered 2017-03-31: 5 mg via INTRAVENOUS

## 2017-03-31 MED ORDER — FENTANYL CITRATE (PF) 100 MCG/2ML IJ SOLN
INTRAMUSCULAR | Status: AC
  Filled 2017-03-31: qty 2

## 2017-03-31 MED ORDER — SILVER NITRATE-POT NITRATE 75-25 % EX MISC
CUTANEOUS | Status: AC
  Filled 2017-03-31: qty 1

## 2017-03-31 MED ORDER — IPRATROPIUM-ALBUTEROL 0.5-2.5 (3) MG/3ML IN SOLN
3.0000 mL | Freq: Once | RESPIRATORY_TRACT | Status: AC
  Administered 2017-03-31: 3 mL via RESPIRATORY_TRACT

## 2017-03-31 MED ORDER — SODIUM CHLORIDE 0.9 % IV SOLN
INTRAVENOUS | Status: DC
  Administered 2017-03-31: 16:00:00 via INTRAVENOUS

## 2017-03-31 MED ORDER — SUGAMMADEX SODIUM 500 MG/5ML IV SOLN
INTRAVENOUS | Status: DC | PRN
  Administered 2017-03-31: 300 mg via INTRAVENOUS

## 2017-03-31 MED ORDER — DEXAMETHASONE SODIUM PHOSPHATE 10 MG/ML IJ SOLN
INTRAMUSCULAR | Status: DC | PRN
  Administered 2017-03-31: 10 mg via INTRAVENOUS

## 2017-03-31 MED ORDER — SUCCINYLCHOLINE CHLORIDE 20 MG/ML IJ SOLN
INTRAMUSCULAR | Status: DC | PRN
  Administered 2017-03-31: 140 mg via INTRAVENOUS

## 2017-03-31 MED ORDER — FUROSEMIDE 10 MG/ML IJ SOLN
INTRAMUSCULAR | Status: AC
  Administered 2017-03-31: 10 mg via INTRAVENOUS
  Filled 2017-03-31: qty 2

## 2017-03-31 SURGICAL SUPPLY — 24 items
ABLATOR ENDOMETRIAL MYOSURE (ABLATOR) ×4 IMPLANT
BAG URINE DRAINAGE (UROLOGICAL SUPPLIES) IMPLANT
CANISTER SUCT 3000ML PPV (MISCELLANEOUS) ×4 IMPLANT
CATH FOLEY 2WAY  5CC 16FR (CATHETERS)
CATH ROBINSON RED A/P 16FR (CATHETERS) ×4 IMPLANT
CATH URTH 16FR FL 2W BLN LF (CATHETERS) IMPLANT
ELECT REM PT RETURN 9FT ADLT (ELECTROSURGICAL)
ELECT RESECT POWERBALL 24F (MISCELLANEOUS) IMPLANT
ELECTRODE REM PT RTRN 9FT ADLT (ELECTROSURGICAL) IMPLANT
GAUZE SPONGE 4X4 16PLY XRAY LF (GAUZE/BANDAGES/DRESSINGS) ×4 IMPLANT
GLOVE BIO SURGEON STRL SZ7 (GLOVE) ×4 IMPLANT
GLOVE BIOGEL PI IND STRL 7.5 (GLOVE) ×2 IMPLANT
GLOVE BIOGEL PI INDICATOR 7.5 (GLOVE) ×2
GOWN STRL REUS W/ TWL LRG LVL3 (GOWN DISPOSABLE) ×4 IMPLANT
GOWN STRL REUS W/TWL LRG LVL3 (GOWN DISPOSABLE) ×4
IV LACTATED RINGERS 1000ML (IV SOLUTION) ×4 IMPLANT
KIT TURNOVER CYSTO (KITS) ×4 IMPLANT
LOOP CUT RT ANGL 28F (MISCELLANEOUS) IMPLANT
PACK DNC HYST (MISCELLANEOUS) ×4 IMPLANT
PAD OB MATERNITY 4.3X12.25 (PERSONAL CARE ITEMS) ×4 IMPLANT
PAD PREP 24X41 OB/GYN DISP (PERSONAL CARE ITEMS) ×4 IMPLANT
TUBING CONNECTING 10 (TUBING) ×6 IMPLANT
TUBING CONNECTING 10' (TUBING) ×2
TUBING HYSTEROSCOPY DOLPHIN (MISCELLANEOUS) ×4 IMPLANT

## 2017-03-31 NOTE — Discharge Instructions (Signed)

## 2017-03-31 NOTE — Anesthesia Procedure Notes (Addendum)
Procedure Name: Intubation Date/Time: 03/31/2017 3:54 PM Performed by: Hedda Slade, CRNA Pre-anesthesia Checklist: Patient identified, Patient being monitored, Timeout performed, Emergency Drugs available and Suction available Patient Re-evaluated:Patient Re-evaluated prior to induction Oxygen Delivery Method: Circle system utilized Preoxygenation: Pre-oxygenation with 100% oxygen Induction Type: IV induction Ventilation: Mask ventilation without difficulty Laryngoscope Size: 3 and McGraph Grade View: Grade II Tube type: Oral Tube size: 7.5 mm Number of attempts: 1 Airway Equipment and Method: Stylet Placement Confirmation: ETT inserted through vocal cords under direct vision,  positive ETCO2 and breath sounds checked- equal and bilateral Secured at: 22 cm Tube secured with: Tape Dental Injury: Teeth and Oropharynx as per pre-operative assessment  Difficulty Due To: Difficulty was anticipated, Difficult Airway- due to reduced neck mobility and Difficult Airway- due to anterior larynx Future Recommendations: Recommend- induction with short-acting agent, and alternative techniques readily available

## 2017-03-31 NOTE — Transfer of Care (Signed)
Immediate Anesthesia Transfer of Care Note  Patient: Anita Krueger  Procedure(s) Performed: DILATATION & CURETTAGE/HYSTEROSCOPY WITH MYOSURE (N/A Uterus)  Patient Location: PACU  Anesthesia Type:General  Level of Consciousness: awake  Airway & Oxygen Therapy: Patient connected to face mask oxygen  Post-op Assessment: Post -op Vital signs reviewed and stable  Post vital signs: Reviewed and stable  Last Vitals:  Vitals:   03/31/17 1517 03/31/17 1709  BP: (!) 170/92 (!) 145/86  Pulse:  85  Resp: 20 14  Temp: (!) 35.8 C 36.5 C  SpO2: 100% 94%    Last Pain:  Vitals:   03/31/17 1709  TempSrc: Temporal         Complications: No apparent anesthesia complications

## 2017-03-31 NOTE — Anesthesia Postprocedure Evaluation (Signed)
Anesthesia Post Note  Patient: Anita Krueger  Procedure(s) Performed: DILATATION & CURETTAGE/HYSTEROSCOPY WITH MYOSURE (N/A Uterus)  Patient location during evaluation: PACU Anesthesia Type: General Level of consciousness: awake and alert and oriented Pain management: pain level controlled Vital Signs Assessment: post-procedure vital signs reviewed and stable Respiratory status: spontaneous breathing Cardiovascular status: blood pressure returned to baseline Anesthetic complications: no     Last Vitals:  Vitals:   03/31/17 1835 03/31/17 1840  BP: 137/87   Pulse: (!) 102 (!) 101  Resp: 13 18  Temp:  36.8 C  SpO2: 94% 93%    Last Pain:  Vitals:   03/31/17 1840  TempSrc: Temporal  PainSc: 2                  Hassan Blackshire

## 2017-03-31 NOTE — Anesthesia Post-op Follow-up Note (Signed)
Anesthesia QCDR form completed.        

## 2017-03-31 NOTE — Anesthesia Preprocedure Evaluation (Addendum)
Anesthesia Evaluation  Patient identified by MRN, date of birth, ID band Patient awake    Reviewed: Allergy & Precautions, NPO status , Patient's Chart, lab work & pertinent test results  Airway Mallampati: III  TM Distance: <3 FB     Dental  (+) Teeth Intact, Chipped   Pulmonary former smoker,    Pulmonary exam normal        Cardiovascular hypertension, Pt. on medications Normal cardiovascular exam     Neuro/Psych  Neuromuscular disease negative psych ROS   GI/Hepatic negative GI ROS, Neg liver ROS,   Endo/Other  diabetes, Well Controlled, Type 2, Oral Hypoglycemic AgentsMorbid obesity  Renal/GU negative Renal ROS  Female GU complaint     Musculoskeletal  (+) Arthritis , Osteoarthritis,    Abdominal Normal abdominal exam  (+)   Peds negative pediatric ROS (+)  Hematology negative hematology ROS (+)   Anesthesia Other Findings   Reproductive/Obstetrics                           Anesthesia Physical Anesthesia Plan  ASA: III  Anesthesia Plan: General   Post-op Pain Management:    Induction: Intravenous  PONV Risk Score and Plan:   Airway Management Planned: Oral ETT  Additional Equipment:   Intra-op Plan:   Post-operative Plan: Extubation in OR  Informed Consent: I have reviewed the patients History and Physical, chart, labs and discussed the procedure including the risks, benefits and alternatives for the proposed anesthesia with the patient or authorized representative who has indicated his/her understanding and acceptance.   Dental advisory given  Plan Discussed with: CRNA and Surgeon  Anesthesia Plan Comments:         Anesthesia Quick Evaluation

## 2017-03-31 NOTE — Op Note (Signed)
  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  INDICATION: 57 y.o. female with irregular and heavy uterine bleeding.  Ultrasound showing 22 mm endometrial stripe. 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   FINDINGS:  1) Exam under anesthesia revealed small, mobile anteverted uterus  2) several medium blood clots removed from vagina at beginning of case 3) Hysteroscopy revealed a uterine cavity with several right lateral and right antero-lateral polypoid lesions.   4) Bilateral tubal ostia not visualized.  5) Normal appearing endocervical canal.  PROCEDURE IN DETAIL:  After informed consent was obtained, the patient was taken to the operating room where anesthesia was obtained without difficulty. The patient was positioned in the dorsal lithotomy position in candy cane stirrups.  The patient's bladder was catheterized with an in-and-out foley catheter.  The patient was examined under anesthesia, with the above noted findings.  The bi-valved speculum was placed inside the patient's vagina, and the the anterior lip of the cervix was  grasped with the tenaculum.  The cervix was progressively dilated to a 7 mm Hegar dilator.  The hysteroscope was introduced, with the above noted findings. The hystersocope was removed and the uterine cavity was curetted until a gritty texture was noted, yielding a large amount of endometrial curettings.  The hysteroscope was re-introduced with persistent polypoid lesions as noted above.  The MyoSure device was used to remove the polypoid lesions to the level of the endometrial cavity contour.  Hemostasis was noted, and all instruments were removed.  The tenaculum was removed and the tenaculum sites were made hemostatic with silver nitrate.   She was then taken out of dorsal lithotomy.  The patient tolerated the procedure well.  Sponge, lap and needle counts were correct x2.  The patient was taken to recovery room in excellent condition.    [name], MD, [facility] 03/31/2017 4:59 PM

## 2017-03-31 NOTE — Interval H&P Note (Signed)
History and Physical Interval Note:  03/31/2017 3:19 PM  Anita Krueger  has presented today for surgery, with the diagnosis of complex endometrial hyperplasia without atypia,thickened endometrium,menorrhagia without regular cycle  The various methods of treatment have been discussed with the patient and family. After consideration of risks, benefits and other options for treatment, the patient has consented to  Procedure(s): DILATATION AND CURETTAGE /HYSTEROSCOPY (N/A) as a surgical intervention .  The patient's history has been reviewed, patient examined, no change in status, stable for surgery.  I have reviewed the patient's chart and labs.  Questions were answered to the patient's satisfaction.     Prentice Docker, MD 03/31/2017 3:19 PM

## 2017-04-01 ENCOUNTER — Encounter: Payer: Self-pay | Admitting: Obstetrics and Gynecology

## 2017-04-04 ENCOUNTER — Encounter: Payer: Self-pay | Admitting: Dietician

## 2017-04-04 LAB — SURGICAL PATHOLOGY

## 2017-04-07 ENCOUNTER — Telehealth: Payer: Self-pay | Admitting: Obstetrics and Gynecology

## 2017-04-07 NOTE — Telephone Encounter (Signed)
Left generic VM 

## 2017-04-12 NOTE — Telephone Encounter (Signed)
Pt has not returned call. Do you want to try her again since it has been 5 days?

## 2017-04-19 ENCOUNTER — Encounter: Payer: Self-pay | Admitting: Obstetrics and Gynecology

## 2017-04-19 ENCOUNTER — Ambulatory Visit (INDEPENDENT_AMBULATORY_CARE_PROVIDER_SITE_OTHER): Payer: Managed Care, Other (non HMO) | Admitting: Obstetrics and Gynecology

## 2017-04-19 VITALS — BP 136/88 | Ht 65.0 in | Wt 278.0 lb

## 2017-04-19 DIAGNOSIS — N8501 Benign endometrial hyperplasia: Secondary | ICD-10-CM

## 2017-04-19 MED ORDER — MEDROXYPROGESTERONE ACETATE 10 MG PO TABS: 10.0000 mg | ORAL_TABLET | Freq: Every day | ORAL | 6 refills | Status: DC

## 2017-04-19 NOTE — Progress Notes (Signed)
   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  Subjective: Patient reports marked improvement in her preop symptoms. Eating a regular diet without difficulty. The patient is not having any pain.  Activity: normal activities of daily living.  Objective: Vitals:   04/19/17 1615  BP: 136/88   Vital Signs: BP 136/88   Ht 5\' 5"  (1.651 m)   Wt 278 lb (126.1 kg)   LMP 03/29/2017 (Exact Date)   BMI 46.26 kg/m  Constitutional: Well nourished, well developed female in no acute distress.  HEENT: normal Skin: Warm and dry.  Extremity: no edema  Abdomen: Soft, non-tender, normal bowel sounds; no bruits, organomegaly or masses.    Assessment: 57 y.o. s/p hysteroscopy, dilation and curettage for complex endometrial hyperplasia without atypia progressing well  Plan: Patient has done well after surgery with no apparent complications.  I have discussed the post-operative course to date, and the expected progress moving forward.  The patient understands what complications to be concerned about.  I will see the patient in routine follow up, or sooner if needed.    Activity plan: No restriction.  Provera 10 mg days 1-10 each month x 6 months. Follow up in 6 months for endometrial biopsy to assess response.  Prentice Docker, MD 04/19/2017, 4:19 PM

## 2017-05-06 ENCOUNTER — Encounter: Payer: Self-pay | Admitting: Internal Medicine

## 2017-05-06 ENCOUNTER — Other Ambulatory Visit: Payer: Self-pay | Admitting: Internal Medicine

## 2017-05-06 ENCOUNTER — Ambulatory Visit: Payer: Managed Care, Other (non HMO) | Admitting: Internal Medicine

## 2017-05-06 VITALS — BP 122/80 | HR 112 | Ht 65.0 in | Wt 274.0 lb

## 2017-05-06 DIAGNOSIS — E785 Hyperlipidemia, unspecified: Secondary | ICD-10-CM | POA: Diagnosis not present

## 2017-05-06 DIAGNOSIS — I10 Essential (primary) hypertension: Secondary | ICD-10-CM

## 2017-05-06 DIAGNOSIS — E118 Type 2 diabetes mellitus with unspecified complications: Secondary | ICD-10-CM | POA: Diagnosis not present

## 2017-05-06 DIAGNOSIS — E1169 Type 2 diabetes mellitus with other specified complication: Secondary | ICD-10-CM | POA: Diagnosis not present

## 2017-05-06 MED ORDER — METFORMIN HCL 500 MG PO TABS: 500.0000 mg | ORAL_TABLET | Freq: Two times a day (BID) | ORAL | 0 refills | Status: DC

## 2017-05-06 MED ORDER — PRAVASTATIN SODIUM 20 MG PO TABS: 20.0000 mg | ORAL_TABLET | Freq: Every day | ORAL | 3 refills | Status: DC

## 2017-05-06 NOTE — Progress Notes (Signed)
Date:  05/06/2017   Name:  Anita Krueger   DOB:  [birth year]   MRN:  [id number]   Chief Complaint: Diabetes Diabetes  She presents for her follow-up diabetic visit. She has type 2 (new onset) diabetes mellitus. Her disease course has been improving. Pertinent negatives for hypoglycemia include no headaches or tremors. Pertinent negatives for diabetes include no chest pain, no fatigue, no polydipsia and no polyuria. Symptoms are improving. Current diabetic treatment includes oral agent (monotherapy) (metformin last visit). She is compliant with treatment most of the time. Her weight is stable. She monitors blood glucose at home 1-2 x per day. Her breakfast blood glucose is taken between 7-8 am. Her breakfast blood glucose range is generally 140-180 mg/dl. Her dinner blood glucose is taken between 6-7 pm. Her dinner blood glucose range is generally 130-140 mg/dl. An ACE inhibitor/angiotensin II receptor blocker is being taken.  Hypertension  This is a chronic problem. The problem is controlled. Pertinent negatives include no chest pain, headaches, palpitations or shortness of breath. Past treatments include ACE inhibitors.  Hyperlipidemia  This is a chronic problem. The problem is uncontrolled. Pertinent negatives include no chest pain or shortness of breath. She is currently on no antihyperlipidemic treatment (need to start statin therapy).   Lab Results  Component Value Date   HGBA1C 8.3 (H) 01/14/2017   Lab Results  Component Value Date   CHOL 192 01/14/2017   HDL 34 (L) 01/14/2017   LDLCALC 129 (H) 01/14/2017   TRIG 143 01/14/2017   CHOLHDL 5.6 (H) 01/14/2017   Lab Results  Component Value Date   CREATININE 0.68 01/14/2017   BUN 17 01/14/2017   NA 141 01/14/2017   K 4.2 01/14/2017   CL 101 01/14/2017   CO2 26 01/14/2017      Review of Systems  Constitutional: Negative for appetite change, fatigue, fever and unexpected weight change.  HENT: Negative for tinnitus and trouble  swallowing.   Eyes: Negative for visual disturbance.  Respiratory: Negative for cough, chest tightness and shortness of breath.   Cardiovascular: Negative for chest pain, palpitations and leg swelling.  Gastrointestinal: Negative for abdominal pain.  Endocrine: Negative for polydipsia and polyuria.  Genitourinary: Negative for dysuria and hematuria.  Musculoskeletal: Negative for arthralgias.  Neurological: Negative for tremors, numbness and headaches.  Psychiatric/Behavioral: Negative for dysphoric mood.    Patient Active Problem List   Diagnosis Date Noted  . 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 Menorrhagia with irregular cycle 01/19/2017  . Hip pain, acute, left 05/12/2016  . Essential hypertension 04/04/2015  . DM (diabetes mellitus) with complications ([place]) 27/51/7001  . Hyperlipidemia associated with type 2 diabetes mellitus ([place]) 09/29/2014    Prior to Admission medications   Medication Sig Start Date End Date Taking? Authorizing Provider  amLODipine (NORVASC) 5 MG tablet Take 1 tablet (5 mg total) by mouth daily. 01/14/17   Glean Hess, MD  Carboxymethylcellul-Glycerin (CLEAR EYES FOR DRY EYES) 1-0.25 % SOLN Place 1 drop into both eyes 3 (three) times daily as needed (dry eyes).    [provider]  carboxymethylcellulose (REFRESH PLUS) 0.5 % SOLN Place 1 drop into both eyes 3 (three) times daily as needed (dry eyes).    [provider]  HYDROcodone-acetaminophen (NORCO) 5-325 MG tablet Take 1 tablet by mouth every 6 (six) hours as needed (breakthrough pain). 03/31/17   [name], MD  ibuprofen (ADVIL,MOTRIN) 600 MG tablet Take 1 tablet (600 mg total) by mouth every  6 (six) hours as needed for mild pain or cramping. 03/31/17   [name], MD  loratadine (CLARITIN) 10 MG tablet Take 10 mg by mouth daily as needed for allergies.    [provider]  losartan-hydrochlorothiazide (HYZAAR) 100-25 MG tablet TAKE 1  TABLET BY MOUTH  DAILY 12/27/16   Glean Hess, MD  medroxyPROGESTERone (PROVERA) 10 MG tablet Take 1 tablet (10 mg total) by mouth daily for 10 days. Take days 1-10 each month 04/19/17 04/29/17  [name], MD  metFORMIN (GLUCOPHAGE) 500 MG tablet Take 500 mg by mouth 2 (two) times daily with a meal.    [provider]  Multiple Vitamins-Minerals (DAILY MULTIVITAMIN PO) Take 2 tablets by mouth daily.    [provider]  ONE TOUCH ULTRA TEST test strip USE 1 STRIP UP TO 4 TIMES  DAILY AS DIRECTED 03/11/17   Glean Hess, MD  [facility] DELICA LANCETS FINE MISC USE 1 LANCET UP TO 4 TIMES  DAILY AS DIRECTED 03/11/17   Glean Hess, MD    No Known Allergies  Past Surgical History:  Procedure Laterality Date  . bell's palsy  2016   right eye sluggish  . DILATATION & CURETTAGE/HYSTEROSCOPY WITH MYOSURE N/A 03/31/2017   Procedure: DILATATION & CURETTAGE/HYSTEROSCOPY WITH MYOSURE;  Surgeon: [name], MD;  Location: [facility] ORS;  Service: Gynecology;  Laterality: N/A;  . LAPAROSCOPY      Social History   Tobacco Use  . Smoking status: Former Smoker    Types: Cigarettes  . Smokeless tobacco: Never Used  . Tobacco comment: 40 years ago  Substance Use Topics  . Alcohol use: Yes    Alcohol/week: 1.2 oz    Types: 2 Shots of liquor per week    Comment: occasional  . Drug use: No     Medication list has been reviewed and updated.  PHQ 2/9 Scores 02/07/2017 01/14/2017  PHQ - 2 Score 0 0    Physical Exam  Constitutional: She is oriented to person, place, and time. She appears well-developed. No distress.  HENT:  Head: Normocephalic and atraumatic.  Neck: Normal range of motion. Neck supple.  Cardiovascular: Normal rate, regular rhythm and normal heart sounds.  Pulmonary/Chest: Effort normal and breath sounds normal. No respiratory distress. She has no wheezes.  Musculoskeletal: Normal range of motion. She exhibits edema (trace ankle edema R>L). She  exhibits no tenderness.  Neurological: She is alert and oriented to person, place, and time.  Skin: Skin is warm and dry. No rash noted.  Psychiatric: She has a normal mood and affect. Her behavior is normal. Thought content normal.  Nursing note and vitals reviewed.   BP 122/80   Pulse (!) 112   Ht 5\' 5"  (1.651 m)   Wt 274 lb (124.3 kg)   SpO2 96%   BMI 45.60 kg/m   Assessment and Plan: 1. DM (diabetes mellitus) with complications ([facility]) Continue metformin Add pravachol - Comprehensive metabolic panel - Hemoglobin A1c - Microalbumin / creatinine urine ratio  2. Essential hypertension controlled  3. Hyperlipidemia associated with type 2 diabetes mellitus ([place]) Add pravachol   Meds ordered this encounter  Medications  . metFORMIN (GLUCOPHAGE) 500 MG tablet    Sig: Take 1 tablet (500 mg total) by mouth 2 (two) times daily with a meal.    Dispense:  60 tablet    Refill:  0  . pravastatin (PRAVACHOL) 20 MG tablet    Sig: Take 1 tablet (20 mg total)  by mouth daily.    Dispense:  90 tablet    Refill:  3    Partially dictated using Editor, commissioning. Any errors are unintentional.  Halina Maidens, MD [facility]  05/06/2017

## 2017-05-07 LAB — COMPREHENSIVE METABOLIC PANEL
ALBUMIN: 4.5 g/dL (ref 3.5–5.5)
ALT: 60 IU/L — ABNORMAL HIGH (ref 0–32)
AST: 47 IU/L — ABNORMAL HIGH (ref 0–40)
Albumin/Globulin Ratio: 1.7 (ref 1.2–2.2)
Alkaline Phosphatase: 103 IU/L (ref 39–117)
BUN / CREAT RATIO: 19 (ref 9–23)
BUN: 14 mg/dL (ref 6–24)
Bilirubin Total: <0.2 mg/dL (ref 0.0–1.2)
CALCIUM: 9.5 mg/dL (ref 8.7–10.2)
CO2: 22 mmol/L (ref 20–29)
CREATININE: 0.72 mg/dL (ref 0.57–1.00)
Chloride: 101 mmol/L (ref 96–106)
GFR, EST AFRICAN AMERICAN: 109 mL/min/{1.73_m2} (ref >59)
GFR, EST NON AFRICAN AMERICAN: 95 mL/min/{1.73_m2} (ref >59)
GLOBULIN, TOTAL: 2.7 g/dL (ref 1.5–4.5)
Glucose: 130 mg/dL — ABNORMAL HIGH (ref 65–99)
Potassium: 4 mmol/L (ref 3.5–5.2)
SODIUM: 145 mmol/L — AB (ref 134–144)
Total Protein: 7.2 g/dL (ref 6.0–8.5)

## 2017-05-07 LAB — HEMOGLOBIN A1C
Est. average glucose Bld gHb Est-mCnc: 143 mg/dL
Hgb A1c MFr Bld: 6.6 % — ABNORMAL HIGH (ref 4.8–5.6)

## 2017-05-10 ENCOUNTER — Other Ambulatory Visit: Payer: Self-pay | Admitting: Internal Medicine

## 2017-05-10 DIAGNOSIS — I10 Essential (primary) hypertension: Secondary | ICD-10-CM

## 2017-05-10 LAB — MICROALBUMIN / CREATININE URINE RATIO: Creatinine, Urine: 11.9 mg/dL

## 2017-05-10 LAB — SPECIMEN STATUS REPORT

## 2017-06-13 ENCOUNTER — Other Ambulatory Visit: Payer: Self-pay | Admitting: Internal Medicine

## 2017-06-27 ENCOUNTER — Telehealth: Payer: Self-pay

## 2017-06-27 NOTE — Telephone Encounter (Signed)
Pt has been taking progesterone. The second half of the month she is bleeding a whole lot. She is inquiring if there is something that can be done about it or if this process is normal. It's making her feel really tired. WT#[phone].

## 2017-06-28 NOTE — Telephone Encounter (Signed)
I would give it a few months.  It should get better after several months and should stop altogether.  If she would like, she could come in for an appt in 3 months and we can see how things are going.

## 2017-06-28 NOTE — Telephone Encounter (Signed)
Pt states she will see how this next month goes and will call back to schedule an appt if she feels like she needs it. Pt states it has been better today. Will call to schedule if she needs to

## 2017-07-15 ENCOUNTER — Ambulatory Visit: Payer: Self-pay | Admitting: Internal Medicine

## 2017-09-23 ENCOUNTER — Ambulatory Visit: Payer: Managed Care, Other (non HMO) | Admitting: Internal Medicine

## 2017-09-23 ENCOUNTER — Encounter: Payer: Self-pay | Admitting: Internal Medicine

## 2017-09-23 VITALS — BP 136/88 | HR 92 | Ht 65.0 in | Wt 272.0 lb

## 2017-09-23 DIAGNOSIS — E1169 Type 2 diabetes mellitus with other specified complication: Secondary | ICD-10-CM | POA: Diagnosis not present

## 2017-09-23 DIAGNOSIS — E118 Type 2 diabetes mellitus with unspecified complications: Secondary | ICD-10-CM | POA: Diagnosis not present

## 2017-09-23 DIAGNOSIS — Z23 Encounter for immunization: Secondary | ICD-10-CM | POA: Diagnosis not present

## 2017-09-23 DIAGNOSIS — I10 Essential (primary) hypertension: Secondary | ICD-10-CM | POA: Diagnosis not present

## 2017-09-23 DIAGNOSIS — Z1239 Encounter for other screening for malignant neoplasm of breast: Secondary | ICD-10-CM

## 2017-09-23 DIAGNOSIS — E785 Hyperlipidemia, unspecified: Secondary | ICD-10-CM

## 2017-09-23 DIAGNOSIS — Z1231 Encounter for screening mammogram for malignant neoplasm of breast: Secondary | ICD-10-CM

## 2017-09-23 NOTE — Progress Notes (Signed)
Date:  09/23/2017   Name:  Anita Krueger   DOB:  12-30-1960   MRN:  [id number]   Chief Complaint: Diabetes (BS- this morning 193 ) Diabetes  She presents for her follow-up diabetic visit. She has type 2 diabetes mellitus. Pertinent negatives for hypoglycemia include no headaches or tremors. Pertinent negatives for diabetes include no chest pain, no fatigue, no polydipsia and no polyuria. Current diabetic treatment includes oral agent (monotherapy). She is compliant with treatment all of the time. Eye exam is current.  Hypertension  This is a chronic problem. Pertinent negatives include no chest pain, headaches, palpitations or shortness of breath. Past treatments include angiotensin blockers and calcium channel blockers. The current treatment provides significant improvement.  Hyperlipidemia  The problem is controlled. Pertinent negatives include no chest pain or shortness of breath. Current antihyperlipidemic treatment includes statins. The current treatment provides significant improvement of lipids.   Lab Results  Component Value Date   HGBA1C 6.6 (H) 05/06/2017   Lab Results  Component Value Date   CREATININE 0.72 05/06/2017   BUN 14 05/06/2017   NA 145 (H) 05/06/2017   K 4.0 05/06/2017   CL 101 05/06/2017   CO2 22 05/06/2017      Review of Systems  Constitutional: Negative for appetite change, fatigue, fever and unexpected weight change.  HENT: Negative for tinnitus and trouble swallowing.   Eyes: Positive for redness. Negative for visual disturbance.  Respiratory: Negative for cough, chest tightness and shortness of breath.   Cardiovascular: Positive for leg swelling. Negative for chest pain and palpitations.  Gastrointestinal: Negative for abdominal pain.  Endocrine: Negative for polydipsia and polyuria.  Genitourinary: Negative for dysuria and hematuria.  Musculoskeletal: Negative for arthralgias.  Neurological: Negative for tremors, numbness and headaches.    Psychiatric/Behavioral: Negative for dysphoric mood.    Patient Active Problem List   Diagnosis Date Noted  . 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 Menorrhagia with irregular cycle 01/19/2017  . Hip pain, acute, left 05/12/2016  . Essential hypertension 04/04/2015  . DM (diabetes mellitus) with complications ([place]) 98/92/1194  . Hyperlipidemia associated with type 2 diabetes mellitus ([place]) 09/29/2014    No Known Allergies  Past Surgical History:  Procedure Laterality Date  . bell's palsy  2016   right eye sluggish  . DILATATION & CURETTAGE/HYSTEROSCOPY WITH MYOSURE N/A 03/31/2017   Procedure: DILATATION & CURETTAGE/HYSTEROSCOPY WITH MYOSURE;  Surgeon: [name], MD;  Location: [facility] ORS;  Service: Gynecology;  Laterality: N/A;  . LAPAROSCOPY      Social History   Tobacco Use  . Smoking status: Former Smoker    Types: Cigarettes  . Smokeless tobacco: Never Used  . Tobacco comment: 40 years ago  Substance Use Topics  . Alcohol use: Yes    Alcohol/week: 2.0 standard drinks    Types: 2 Shots of liquor per week    Comment: occasional  . Drug use: No     Medication list has been reviewed and updated.  Current Meds  Medication Sig  . albuterol (PROVENTIL HFA;VENTOLIN HFA) 108 (90 Base) MCG/ACT inhaler Inhale into the lungs every 6 (six) hours as needed for wheezing or shortness of breath.  Marland Kitchen amLODipine (NORVASC) 5 MG tablet Take 1 tablet (5 mg total) by mouth daily.  . Carboxymethylcellul-Glycerin (CLEAR EYES FOR DRY EYES) 1-0.25 % SOLN Place 1 drop into both eyes 3 (three) times daily as needed (dry eyes).  . carboxymethylcellulose (REFRESH PLUS) 0.5 % SOLN Place 1 drop into  both eyes 3 (three) times daily as needed (dry eyes).  Marland Kitchen loratadine (CLARITIN) 10 MG tablet Take 10 mg by mouth daily as needed for allergies.  Marland Kitchen losartan-hydrochlorothiazide (HYZAAR) 100-25 MG tablet TAKE 1 TABLET BY MOUTH  DAILY  . medroxyPROGESTERone (PROVERA) 10 MG  tablet Take 1 tablet (10 mg total) by mouth daily for 10 days. Take days 1-10 each month  . metFORMIN (GLUCOPHAGE) 500 MG tablet TAKE 1 TABLET BY MOUTH TWO  TIMES DAILY WITH A MEAL  . Multiple Vitamins-Minerals (DAILY MULTIVITAMIN PO) Take 2 tablets by mouth daily.  . ONE TOUCH ULTRA TEST test strip USE 1 STRIP UP TO 4 TIMES  DAILY AS DIRECTED  . ONETOUCH DELICA LANCETS FINE MISC USE 1 LANCET UP TO 4 TIMES  DAILY AS DIRECTED  . pravastatin (PRAVACHOL) 20 MG tablet Take 1 tablet (20 mg total) by mouth daily.    PHQ 2/9 Scores 02/07/2017 01/14/2017  PHQ - 2 Score 0 0   Wt Readings from Last 3 Encounters:  09/23/17 272 lb (123.4 kg)  05/06/17 274 lb (124.3 kg)  04/19/17 278 lb (126.1 kg)    Physical Exam  Constitutional: She is oriented to person, place, and time. She appears well-developed. No distress.  HENT:  Head: Normocephalic and atraumatic.  Neck: Normal range of motion. Neck supple. Carotid bruit is not present.  Cardiovascular: Normal rate, regular rhythm and normal heart sounds.  Pulmonary/Chest: Effort normal and breath sounds normal. No respiratory distress.  Abdominal: Soft.  Musculoskeletal: Normal range of motion.  Lymphadenopathy:    She has no cervical adenopathy.  Neurological: She is alert and oriented to person, place, and time.  Skin: Skin is warm and dry. No rash noted.  Psychiatric: She has a normal mood and affect. Her behavior is normal. Thought content normal.  Nursing note and vitals reviewed.   BP (!) 142/84   Pulse 92   Ht 5\' 5"  (1.651 m)   Wt 272 lb (123.4 kg)   SpO2 99%   BMI 45.26 kg/m   Assessment and Plan: 1. DM (diabetes mellitus) with complications ([place]) controlled - Hemoglobin A1c  2. Essential hypertension controlled  3. Hyperlipidemia associated with type 2 diabetes mellitus ([place]) On statin therapy  4. Need for pneumococcal vaccination - Pneumococcal polysaccharide vaccine 23-valent greater than or equal to 2yo  subcutaneous/IM  5. Breast cancer screening - MM 3D SCREEN BREAST BILATERAL; Future   No orders of the defined types were placed in this encounter.   Partially dictated using Editor, commissioning. Any errors are unintentional.  Halina Maidens, MD [facility]  09/23/2017

## 2017-09-23 NOTE — Patient Instructions (Signed)

## 2017-09-24 LAB — HEMOGLOBIN A1C
Est. average glucose Bld gHb Est-mCnc: 128 mg/dL
Hgb A1c MFr Bld: 6.1 % — ABNORMAL HIGH (ref 4.8–5.6)

## 2017-10-21 ENCOUNTER — Ambulatory Visit (INDEPENDENT_AMBULATORY_CARE_PROVIDER_SITE_OTHER): Payer: Managed Care, Other (non HMO) | Admitting: Obstetrics and Gynecology

## 2017-10-21 ENCOUNTER — Other Ambulatory Visit (HOSPITAL_COMMUNITY)
Admission: RE | Admit: 2017-10-21 | Discharge: 2017-10-21 | Disposition: A | Discharge status: Home / Self Care | Payer: Managed Care, Other (non HMO) | Source: Ambulatory Visit | Attending: Obstetrics and Gynecology | Admitting: Obstetrics and Gynecology

## 2017-10-21 ENCOUNTER — Encounter: Payer: Self-pay | Admitting: Obstetrics and Gynecology

## 2017-10-21 VITALS — BP 146/98 | Ht 65.0 in | Wt 274.0 lb

## 2017-10-21 DIAGNOSIS — N8501 Benign endometrial hyperplasia: Secondary | ICD-10-CM | POA: Diagnosis not present

## 2017-10-21 NOTE — Progress Notes (Signed)
Obstetrics & Gynecology Office Visit   Chief Complaint  Patient presents with  . Follow-up   History of Present Illness: 57 y.o. G80P0020 female who presents in follow up from a diagnosis of complex endometrial hyperplasia without atypia. She has been taking oral provera 10mg  daily since her last visit. She states that her withdrawal bleeding, initially, was very heavy. It has become much lighter in recent months.  She has had no side effects of the medication.   Past Medical History:  Diagnosis Date  . Arthritis   . Bell's palsy 2016   right eye droops  . Diabetes mellitus without complication ([place])   . Hyperlipidemia   . Hypertension     Past Surgical History:  Procedure Laterality Date  . bell's palsy  2016   right eye sluggish  . DILATATION & CURETTAGE/HYSTEROSCOPY WITH MYOSURE N/A 03/31/2017   Procedure: DILATATION & CURETTAGE/HYSTEROSCOPY WITH MYOSURE;  Surgeon: [name], MD;  Location: [facility] ORS;  Service: Gynecology;  Laterality: N/A;  . LAPAROSCOPY      Gynecologic History: No LMP recorded. (Menstrual status: Perimenopausal).  Obstetric History: G2P0020  Family History  Problem Relation Age of Onset  . Hypertension Mother   . Diabetes Mother   . Hypertension Father   . Breast cancer Cousin        PAT COUSIN    Social History   Socioeconomic History  . Marital status: Single    Spouse name: Not on file  . Number of children: Not on file  . Years of education: Not on file  . Highest education level: Not on file  Occupational History  . Not on file  Social Needs  . Financial resource strain: Not on file  . Food insecurity:    Worry: Not on file    Inability: Not on file  . Transportation needs:    Medical: Not on file    Non-medical: Not on file  Tobacco Use  . Smoking status: Former Smoker    Types: Cigarettes  . Smokeless tobacco: Never Used  . Tobacco comment: 40 years ago  Substance and Sexual Activity  . Alcohol use: Yes   Alcohol/week: 2.0 standard drinks    Types: 2 Shots of liquor per week    Comment: occasional  . Drug use: No  . Sexual activity: Not Currently    Birth control/protection: None  Lifestyle  . Physical activity:    Days per week: Not on file    Minutes per session: Not on file  . Stress: Not on file  Relationships  . Social connections:    Talks on phone: Not on file    Gets together: Not on file    Attends religious service: Not on file    Active member of club or organization: Not on file    Attends meetings of clubs or organizations: Not on file    Relationship status: Not on file  . Intimate partner violence:    Fear of current or ex partner: Not on file    Emotionally abused: Not on file    Physically abused: Not on file    Forced sexual activity: Not on file  Other Topics Concern  . Not on file  Social History Narrative  . Not on file    No Known Allergies  Prior to Admission medications   Medication Sig Start Date End Date Taking? Authorizing Provider  albuterol (PROVENTIL HFA;VENTOLIN HFA) 108 (90 Base) MCG/ACT inhaler Inhale into the lungs every 6 (six) hours  as needed for wheezing or shortness of breath.    [provider]  amLODipine (NORVASC) 5 MG tablet Take 1 tablet (5 mg total) by mouth daily. 01/14/17   Glean Hess, MD  Carboxymethylcellul-Glycerin (CLEAR EYES FOR DRY EYES) 1-0.25 % SOLN Place 1 drop into both eyes 3 (three) times daily as needed (dry eyes).    [provider]  carboxymethylcellulose (REFRESH PLUS) 0.5 % SOLN Place 1 drop into both eyes 3 (three) times daily as needed (dry eyes).    [provider]  loratadine (CLARITIN) 10 MG tablet Take 10 mg by mouth daily as needed for allergies.    [provider]  losartan-hydrochlorothiazide (HYZAAR) 100-25 MG tablet TAKE 1 TABLET BY MOUTH  DAILY 05/10/17   Glean Hess, MD  medroxyPROGESTERone (PROVERA) 10 MG tablet Take 1 tablet (10 mg total) by mouth daily  for 10 days. Take days 1-10 each month 04/19/17 09/23/17  [name], MD  metFORMIN (GLUCOPHAGE) 500 MG tablet TAKE 1 TABLET BY MOUTH TWO  TIMES DAILY WITH A MEAL 06/13/17   Glean Hess, MD  Multiple Vitamins-Minerals (DAILY MULTIVITAMIN PO) Take 2 tablets by mouth daily.    [provider]  ONE TOUCH ULTRA TEST test strip USE 1 STRIP UP TO 4 TIMES  DAILY AS DIRECTED 03/11/17   Glean Hess, MD  [facility] DELICA LANCETS FINE MISC USE 1 LANCET UP TO 4 TIMES  DAILY AS DIRECTED 03/11/17   Glean Hess, MD  pravastatin (PRAVACHOL) 20 MG tablet Take 1 tablet (20 mg total) by mouth daily. 05/06/17   Glean Hess, MD    Review of Systems  Constitutional: Negative.   HENT: Negative.   Eyes: Negative.   Respiratory: Negative.   Cardiovascular: Negative.   Gastrointestinal: Negative.   Genitourinary: Negative.   Musculoskeletal: Negative.   Skin: Negative.   Neurological: Negative.   Psychiatric/Behavioral: Negative.      Physical Exam BP (!) 146/98   Ht 5\' 5"  (1.651 m)   Wt 274 lb (124.3 kg)   BMI 45.60 kg/m  No LMP recorded. (Menstrual status: Perimenopausal). Physical Exam  Constitutional: She is oriented to person, place, and time. She appears well-developed and well-nourished. No distress.  Genitourinary: Vagina normal. Pelvic exam was performed with patient supine. There is no rash, tenderness or lesion on the right labia. There is no rash, tenderness or lesion on the left labia. Cervix does not exhibit motion tenderness, lesion, polyp or nabothian cyst.  Genitourinary Comments: Bimanual limited by body habitus  HENT:  Head: Normocephalic and atraumatic.  Eyes: Conjunctivae are normal. No scleral icterus.  Cardiovascular: Normal rate and regular rhythm.  Pulmonary/Chest: Effort normal and breath sounds normal. She has no wheezes. She has no rales.  Abdominal: Soft. Bowel sounds are normal. There is no tenderness.  Musculoskeletal: Normal range of motion.  She exhibits no edema.  Neurological: She is alert and oriented to person, place, and time. No cranial nerve deficit.  Skin: Skin is warm and dry.  Psychiatric: She has a normal mood and affect. Her behavior is normal. Judgment normal.    Endometrial Biopsy After discussion with the patient regarding her abnormal uterine bleeding I recommended that she proceed with an endometrial biopsy for further diagnosis. The risks, benefits, alternatives, and indications for an endometrial biopsy were discussed with the patient in detail. She understood the risks including infection, bleeding, cervical laceration and uterine perforation.  Verbal consent was obtained.   PROCEDURE NOTE:  [place] endometrial biopsy was performed using aseptic technique with iodine preparation.  The uterus was sounded to a length of 9 cm.  Adequate sampling was obtained with minimal blood loss.  The patient tolerated the procedure well.  Disposition will be pending pathology.  Prentice Docker, MD  [facility] 10/21/2017  9:32 AM   Female chaperone present for pelvic and breast  portions of the physical exam  Assessment: 57 y.o. G2P0020 female here for  1. Complex endometrial hyperplasia without atypia      Plan: Problem List Items Addressed This Visit      Genitourinary   Complex endometrial hyperplasia without atypia - Primary   Relevant Orders   Surgical pathology     Biopsy performed today. Reviewed overall treatment plan for her diagnosis.  Continue oral provera.  Change in management possibilities discussed based on today's findings.   15 minutes spent in face to face discussion with > 50% spent in counseling,management, and coordination of care of her complex endometrial hyperplasia without atypia.   Prentice Docker, MD 10/21/2017 9:32 AM

## 2017-10-28 ENCOUNTER — Other Ambulatory Visit: Payer: Self-pay | Admitting: Obstetrics and Gynecology

## 2017-10-28 ENCOUNTER — Telehealth: Payer: Self-pay | Admitting: Obstetrics and Gynecology

## 2017-10-28 DIAGNOSIS — N8501 Benign endometrial hyperplasia: Secondary | ICD-10-CM

## 2017-10-28 MED ORDER — MEDROXYPROGESTERONE ACETATE 10 MG PO TABS: 10.0000 mg | ORAL_TABLET | Freq: Every day | ORAL | 6 refills | Status: DC

## 2017-10-28 NOTE — Telephone Encounter (Signed)
Discussed normal pathology results. Recommend continuing to take provera 10 days each month for next 6 months, as she does still have bleeding. All questions answered.

## 2017-10-28 NOTE — Telephone Encounter (Signed)
Patient is calling wanting to know what the next step for treatment will be since her last visit. Please advise

## 2017-11-07 ENCOUNTER — Other Ambulatory Visit: Payer: Self-pay

## 2017-11-07 DIAGNOSIS — I10 Essential (primary) hypertension: Secondary | ICD-10-CM

## 2017-11-07 MED ORDER — LOSARTAN POTASSIUM-HCTZ 100-25 MG PO TABS: 1.0000 | ORAL_TABLET | Freq: Every day | ORAL | 0 refills | Status: DC

## 2017-12-13 ENCOUNTER — Ambulatory Visit: Payer: Managed Care, Other (non HMO)

## 2017-12-15 ENCOUNTER — Ambulatory Visit
Admission: RE | Admit: 2017-12-15 | Discharge: 2017-12-15 | Disposition: A | Discharge status: Home / Self Care | Payer: Managed Care, Other (non HMO) | Source: Ambulatory Visit | Attending: Internal Medicine | Admitting: Internal Medicine

## 2017-12-15 DIAGNOSIS — Z1239 Encounter for other screening for malignant neoplasm of breast: Secondary | ICD-10-CM | POA: Diagnosis present

## 2017-12-26 ENCOUNTER — Other Ambulatory Visit: Payer: Self-pay | Admitting: Internal Medicine

## 2018-01-12 ENCOUNTER — Telehealth: Payer: Self-pay

## 2018-01-12 NOTE — Telephone Encounter (Signed)
Pt received statement from [facility] for $5.76 which she paid with her card thru her insurance.  They would like a receipt.  How does she go about getting this receipt?  [phone] Adv pt to call [facility], tell them she paid over the phone and needs them to send her a receipt.

## 2018-01-20 ENCOUNTER — Encounter: Payer: Self-pay | Admitting: Internal Medicine

## 2018-01-20 ENCOUNTER — Ambulatory Visit (INDEPENDENT_AMBULATORY_CARE_PROVIDER_SITE_OTHER): Payer: Managed Care, Other (non HMO) | Admitting: Internal Medicine

## 2018-01-20 VITALS — BP 136/82 | HR 72 | Resp 16 | Ht 65.0 in | Wt 275.2 lb

## 2018-01-20 DIAGNOSIS — Z0000 Encounter for general adult medical examination without abnormal findings: Secondary | ICD-10-CM | POA: Diagnosis not present

## 2018-01-20 DIAGNOSIS — E118 Type 2 diabetes mellitus with unspecified complications: Secondary | ICD-10-CM | POA: Diagnosis not present

## 2018-01-20 DIAGNOSIS — E785 Hyperlipidemia, unspecified: Secondary | ICD-10-CM | POA: Diagnosis not present

## 2018-01-20 DIAGNOSIS — E1169 Type 2 diabetes mellitus with other specified complication: Secondary | ICD-10-CM

## 2018-01-20 DIAGNOSIS — Z1211 Encounter for screening for malignant neoplasm of colon: Secondary | ICD-10-CM | POA: Diagnosis not present

## 2018-01-20 DIAGNOSIS — I10 Essential (primary) hypertension: Secondary | ICD-10-CM

## 2018-01-20 LAB — POCT URINALYSIS DIPSTICK
Bilirubin, UA: NEGATIVE
Glucose, UA: NEGATIVE
Ketones, UA: NEGATIVE
LEUKOCYTES UA: NEGATIVE
NITRITE UA: NEGATIVE
PH UA: 6.5 (ref 5.0–8.0)
PROTEIN UA: NEGATIVE
RBC UA: NEGATIVE
SPEC GRAV UA: 1.01 (ref 1.010–1.025)
UROBILINOGEN UA: 0.2 U/dL

## 2018-01-20 NOTE — Progress Notes (Signed)
Date:  01/20/2018   Name:  Anita Krueger   DOB:  02/18/1960   MRN:  [id number]   Chief Complaint: Annual Exam (follows OBGYN ); Diabetes (avg 150 before lunch 140-150 after ); and Hypertension Anita Krueger is a 57 y.o. female who presents today for her Complete Annual Exam. She feels well. She reports exercising some. She reports she is sleeping fairly well.   She recently had her mammogram. She is followed by GYN for pap and pelvic and menorrhagia. She has declined colonoscopy.   Diabetes  She presents for her follow-up diabetic visit. She has type 2 diabetes mellitus. Her disease course has been stable. Pertinent negatives for hypoglycemia include no dizziness, headaches, nervousness/anxiousness or tremors. Pertinent negatives for diabetes include no chest pain, no fatigue, no polydipsia and no polyuria. Current diabetic treatment includes oral agent (monotherapy). She is compliant with treatment most of the time. Her weight is stable. She is following a generally healthy diet. An ACE inhibitor/angiotensin II receptor blocker is being taken.  Hypertension  This is a chronic problem. The problem is controlled. Pertinent negatives include no chest pain, headaches, palpitations or shortness of breath. Past treatments include angiotensin blockers, diuretics and calcium channel blockers. The current treatment provides significant improvement.  Hyperlipidemia  The problem is controlled. Pertinent negatives include no chest pain or shortness of breath. Current antihyperlipidemic treatment includes statins. The current treatment provides significant improvement of lipids. There are no compliance problems.    Lab Results  Component Value Date   HGBA1C 6.1 (H) 09/23/2017   Lab Results  Component Value Date   CREATININE 0.72 05/06/2017   BUN 14 05/06/2017   NA 145 (H) 05/06/2017   K 4.0 05/06/2017   CL 101 05/06/2017   CO2 22 05/06/2017   Lab Results  Component Value Date   CHOL 192  01/14/2017   HDL 34 (L) 01/14/2017   LDLCALC 129 (H) 01/14/2017   TRIG 143 01/14/2017   CHOLHDL 5.6 (H) 01/14/2017     Review of Systems  Constitutional: Negative for chills, fatigue and fever.  HENT: Negative for congestion, hearing loss, tinnitus, trouble swallowing and voice change.   Eyes: Negative for visual disturbance.  Respiratory: Negative for cough, chest tightness, shortness of breath and wheezing.   Cardiovascular: Positive for leg swelling. Negative for chest pain and palpitations.  Gastrointestinal: Negative for abdominal pain, constipation, diarrhea and vomiting.  Endocrine: Negative for polydipsia and polyuria.  Genitourinary: Negative for dysuria, frequency, genital sores, vaginal bleeding and vaginal discharge.  Musculoskeletal: Negative for arthralgias, gait problem and joint swelling.  Skin: Negative for color change and rash.  Neurological: Negative for dizziness, tremors, light-headedness and headaches.  Hematological: Negative for adenopathy. Does not bruise/bleed easily.  Psychiatric/Behavioral: Negative for dysphoric mood and sleep disturbance. The patient is not nervous/anxious.     Patient Active Problem List   Diagnosis Date Noted  . 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 Menorrhagia with irregular cycle 01/19/2017  . Hip pain, acute, left 05/12/2016  . Essential hypertension 04/04/2015  . DM (diabetes mellitus) with complications ([place]) 86/76/1950  . Hyperlipidemia associated with type 2 diabetes mellitus ([place]) 09/29/2014    No Known Allergies  Past Surgical History:  Procedure Laterality Date  . bell's palsy  2016   right eye sluggish  . DILATATION & CURETTAGE/HYSTEROSCOPY WITH MYOSURE N/A 03/31/2017   Procedure: DILATATION & CURETTAGE/HYSTEROSCOPY WITH MYOSURE;  Surgeon: [name], MD;  Location: [facility] ORS;  Service: Gynecology;  Laterality: N/A;  . LAPAROSCOPY      Social History   Tobacco Use  . Smoking  status: Former Smoker    Types: Cigarettes  . Smokeless tobacco: Never Used  . Tobacco comment: 40 years ago  Substance Use Topics  . Alcohol use: Yes    Alcohol/week: 2.0 standard drinks    Types: 2 Shots of liquor per week    Comment: occasional  . Drug use: No     Medication list has been reviewed and updated.  Current Meds  Medication Sig  . albuterol (PROVENTIL HFA;VENTOLIN HFA) 108 (90 Base) MCG/ACT inhaler Inhale into the lungs every 6 (six) hours as needed for wheezing or shortness of breath.  Marland Kitchen amLODipine (NORVASC) 5 MG tablet Take 1 tablet (5 mg total) by mouth daily.  . Carboxymethylcellul-Glycerin (CLEAR EYES FOR DRY EYES) 1-0.25 % SOLN Place 1 drop into both eyes 3 (three) times daily as needed (dry eyes).  . carboxymethylcellulose (REFRESH PLUS) 0.5 % SOLN Place 1 drop into both eyes 3 (three) times daily as needed (dry eyes).  Marland Kitchen loratadine (CLARITIN) 10 MG tablet Take 10 mg by mouth daily as needed for allergies.  Marland Kitchen losartan-hydrochlorothiazide (HYZAAR) 100-25 MG tablet Take 1 tablet by mouth daily.  . medroxyPROGESTERone (PROVERA) 10 MG tablet Take 1 tablet (10 mg total) by mouth daily. Take days 1-10 each month  . metFORMIN (GLUCOPHAGE) 500 MG tablet TAKE 1 TABLET BY MOUTH TWO  TIMES DAILY WITH MEALS  . Multiple Vitamins-Minerals (DAILY MULTIVITAMIN PO) Take 2 tablets by mouth daily.  . ONE TOUCH ULTRA TEST test strip USE 1 STRIP UP TO 4 TIMES  DAILY AS DIRECTED  . ONETOUCH DELICA LANCETS FINE MISC USE 1 LANCET UP TO 4 TIMES  DAILY AS DIRECTED  . pravastatin (PRAVACHOL) 20 MG tablet Take 1 tablet (20 mg total) by mouth daily.    PHQ 2/9 Scores 01/20/2018 02/07/2017 01/14/2017  PHQ - 2 Score 0 0 0    Physical Exam Vitals signs and nursing note reviewed.  Constitutional:      General: She is not in acute distress.    Appearance: She is well-developed.  HENT:     Head: Normocephalic and atraumatic.     Right Ear: Tympanic membrane and ear canal normal.     Left  Ear: Tympanic membrane and ear canal normal.     Nose:     Right Sinus: No maxillary sinus tenderness.     Left Sinus: No maxillary sinus tenderness.     Mouth/Throat:     Pharynx: Uvula midline.  Eyes:     General: No scleral icterus.       Right eye: No discharge.        Left eye: No discharge.     Conjunctiva/sclera: Conjunctivae normal.  Neck:     Musculoskeletal: Normal range of motion. No erythema.     Thyroid: No thyromegaly.     Vascular: No carotid bruit.  Cardiovascular:     Rate and Rhythm: Normal rate and regular rhythm.  No extrasystoles are present.    Pulses: Normal pulses.     Heart sounds: Normal heart sounds. No murmur.  Pulmonary:     Effort: Pulmonary effort is normal. No respiratory distress.     Breath sounds: No wheezing.  Chest:     Breasts:        Right: No mass, nipple discharge, skin change or tenderness.        Left: No mass, nipple discharge, skin  change or tenderness.  Abdominal:     General: Bowel sounds are normal.     Palpations: Abdomen is soft.     Tenderness: There is no abdominal tenderness.  Musculoskeletal: Normal range of motion.     Right lower leg: 1+ Pitting Edema present.     Left lower leg: 1+ Pitting Edema present.  Lymphadenopathy:     Cervical: No cervical adenopathy.  Skin:    General: Skin is warm and dry.     Findings: No rash.  Neurological:     Mental Status: She is alert and oriented to person, place, and time.     Cranial Nerves: No cranial nerve deficit.     Sensory: No sensory deficit.     Deep Tendon Reflexes: Reflexes are normal and symmetric.  Psychiatric:        Speech: Speech normal.        Behavior: Behavior normal.        Thought Content: Thought content normal.    Wt Readings from Last 3 Encounters:  01/20/18 275 lb 3.2 oz (124.8 kg)  10/21/17 274 lb (124.3 kg)  09/23/17 272 lb (123.4 kg)    BP 136/82   Pulse 72   Resp 16   Ht 5\' 5"  (1.651 m)   Wt 275 lb 3.2 oz (124.8 kg)   LMP 12/21/2017    BMI 45.80 kg/m   Assessment and Plan: 1. Annual physical exam Mammogram normal Discussed weight - need to continue low carb and add exercise - POCT urinalysis dipstick  2. Colon cancer screening - Fecal occult blood, imunochemical  3. DM (diabetes mellitus) with complications ([facility]) Continue metformin Low carb diet - Hemoglobin A1c  4. Essential hypertension Fair control Would like to change losartan to olmesartan with next refill (pt will call if readings are still borderline high) - CBC with Differential/Platelet - Comprehensive metabolic panel - TSH  5. Hyperlipidemia associated with type 2 diabetes mellitus ([place]) On statin therapy - Lipid panel   Partially dictated using Editor, commissioning. Any errors are unintentional.  Halina Maidens, MD [facility]  01/20/2018

## 2018-01-21 LAB — CBC WITH DIFFERENTIAL/PLATELET
BASOS ABS: 0 10*3/uL (ref 0.0–0.2)
Basos: 0 %
EOS (ABSOLUTE): 0.2 10*3/uL (ref 0.0–0.4)
Eos: 2 %
HEMOGLOBIN: 8.4 g/dL — AB (ref 11.1–15.9)
Hematocrit: 31.2 % — ABNORMAL LOW (ref 34.0–46.6)
IMMATURE GRANS (ABS): 0 10*3/uL (ref 0.0–0.1)
IMMATURE GRANULOCYTES: 0 %
LYMPHS: 28 %
Lymphocytes Absolute: 2.7 10*3/uL (ref 0.7–3.1)
MCH: 16.8 pg — ABNORMAL LOW (ref 26.6–33.0)
MCHC: 26.9 g/dL — ABNORMAL LOW (ref 31.5–35.7)
MCV: 63 fL — ABNORMAL LOW (ref 79–97)
MONOCYTES: 8 %
Monocytes Absolute: 0.8 10*3/uL (ref 0.1–0.9)
NEUTROS ABS: 5.8 10*3/uL (ref 1.4–7.0)
NEUTROS PCT: 62 %
PLATELETS: 506 10*3/uL — AB (ref 150–450)
RBC: 4.99 x10E6/uL (ref 3.77–5.28)
RDW: 20 % — ABNORMAL HIGH (ref 12.3–15.4)
WBC: 9.5 10*3/uL (ref 3.4–10.8)

## 2018-01-21 LAB — LIPID PANEL
Chol/HDL Ratio: 4.6 ratio — ABNORMAL HIGH (ref 0.0–4.4)
Cholesterol, Total: 147 mg/dL (ref 100–199)
HDL: 32 mg/dL — ABNORMAL LOW (ref >39)
LDL Calculated: 93 mg/dL (ref 0–99)
Triglycerides: 109 mg/dL (ref 0–149)
VLDL Cholesterol Cal: 22 mg/dL (ref 5–40)

## 2018-01-21 LAB — COMPREHENSIVE METABOLIC PANEL
ALBUMIN: 4.5 g/dL (ref 3.5–5.5)
ALT: 50 IU/L — AB (ref 0–32)
AST: 57 IU/L — ABNORMAL HIGH (ref 0–40)
Albumin/Globulin Ratio: 1.6 (ref 1.2–2.2)
Alkaline Phosphatase: 95 IU/L (ref 39–117)
BUN / CREAT RATIO: 21 (ref 9–23)
BUN: 12 mg/dL (ref 6–24)
Bilirubin Total: 0.3 mg/dL (ref 0.0–1.2)
CALCIUM: 9.3 mg/dL (ref 8.7–10.2)
CHLORIDE: 96 mmol/L (ref 96–106)
CO2: 19 mmol/L — ABNORMAL LOW (ref 20–29)
Creatinine, Ser: 0.57 mg/dL (ref 0.57–1.00)
GFR calc non Af Amer: 104 mL/min/{1.73_m2} (ref >59)
GFR, EST AFRICAN AMERICAN: 120 mL/min/{1.73_m2} (ref >59)
GLUCOSE: 88 mg/dL (ref 65–99)
Globulin, Total: 2.8 g/dL (ref 1.5–4.5)
Potassium: 4.1 mmol/L (ref 3.5–5.2)
Sodium: 142 mmol/L (ref 134–144)
TOTAL PROTEIN: 7.3 g/dL (ref 6.0–8.5)

## 2018-01-21 LAB — HEMOGLOBIN A1C
Est. average glucose Bld gHb Est-mCnc: 128 mg/dL
Hgb A1c MFr Bld: 6.1 % — ABNORMAL HIGH (ref 4.8–5.6)

## 2018-01-21 LAB — TSH: TSH: 1.3 u[IU]/mL (ref 0.450–4.500)

## 2018-01-24 ENCOUNTER — Encounter: Payer: Self-pay | Admitting: Internal Medicine

## 2018-01-24 DIAGNOSIS — D509 Iron deficiency anemia, unspecified: Secondary | ICD-10-CM | POA: Insufficient documentation

## 2018-01-24 LAB — SPECIMEN STATUS REPORT

## 2018-01-24 LAB — IRON,TIBC AND FERRITIN PANEL
Ferritin: 13 ng/mL — ABNORMAL LOW (ref 15–150)
IRON SATURATION: 4 % — AB (ref 15–55)
IRON: 22 ug/dL — AB (ref 27–159)
Total Iron Binding Capacity: 536 ug/dL — ABNORMAL HIGH (ref 250–450)
UIBC: 514 ug/dL — ABNORMAL HIGH (ref 131–425)

## 2018-01-25 NOTE — Progress Notes (Signed)
Spoke with patient about lab results. She said she will begin taking iron twice daily with food. She said at this time she doesn't want to do a colonoscopy. She said she don't think she would have the time to do now. But will take iron BID and will discuss colonoscopy at next visit after having iron rechecked once more.

## 2018-01-27 LAB — FECAL OCCULT BLOOD, IMMUNOCHEMICAL: FECAL OCCULT BLD: POSITIVE — AB

## 2018-01-30 ENCOUNTER — Other Ambulatory Visit: Payer: Self-pay | Admitting: Internal Medicine

## 2018-01-30 ENCOUNTER — Other Ambulatory Visit: Payer: Self-pay

## 2018-01-30 DIAGNOSIS — Z1211 Encounter for screening for malignant neoplasm of colon: Secondary | ICD-10-CM

## 2018-01-30 NOTE — Progress Notes (Signed)
Spoke to patient -  informed her of positive stool test for blood. Told her its very important to have colonoscopy asap. She verbalized understanding and agreed to let us place referral for colonoscopy to be scheduled. Told her someone will call her to schedule this.

## 2018-02-21 ENCOUNTER — Telehealth: Payer: Self-pay | Admitting: Gastroenterology

## 2018-02-21 DIAGNOSIS — Z1211 Encounter for screening for malignant neoplasm of colon: Secondary | ICD-10-CM

## 2018-02-21 NOTE — Telephone Encounter (Signed)
Patient contacted office to reschedule her colonoscopy from Jan 31st to Feb 21st.  Her procedure has been rescheduled.  Trish in Endoscopy notified.  Referral note entered and new referral created to reflect new date.  Thanks [employer]

## 2018-02-21 NOTE — Telephone Encounter (Signed)
Needs to reschedule her colonoscopy from Jan 31st to the latest Friday in February.

## 2018-03-12 ENCOUNTER — Other Ambulatory Visit: Payer: Self-pay | Admitting: Internal Medicine

## 2018-03-12 DIAGNOSIS — I10 Essential (primary) hypertension: Secondary | ICD-10-CM

## 2018-03-16 ENCOUNTER — Other Ambulatory Visit: Payer: Self-pay

## 2018-03-16 MED ORDER — OLMESARTAN MEDOXOMIL-HCTZ 40-25 MG PO TABS: 1.0000 | ORAL_TABLET | Freq: Every day | ORAL | 1 refills | Status: DC

## 2018-03-23 ENCOUNTER — Other Ambulatory Visit: Payer: Self-pay

## 2018-03-23 MED ORDER — OLMESARTAN MEDOXOMIL-HCTZ 40-25 MG PO TABS: 1.0000 | ORAL_TABLET | Freq: Every day | ORAL | 0 refills | Status: DC

## 2018-03-31 ENCOUNTER — Encounter
Admission: RE | Disposition: A | Discharge status: Home / Self Care | Payer: Self-pay | Source: Ambulatory Visit | Attending: Gastroenterology

## 2018-03-31 ENCOUNTER — Ambulatory Visit: Payer: Managed Care, Other (non HMO) | Admitting: Anesthesiology

## 2018-03-31 ENCOUNTER — Ambulatory Visit
Admission: RE | Admit: 2018-03-31 | Discharge: 2018-03-31 | Disposition: A | Discharge status: Home / Self Care | Payer: Managed Care, Other (non HMO) | Source: Ambulatory Visit | Attending: Gastroenterology | Admitting: Gastroenterology

## 2018-03-31 DIAGNOSIS — Z79899 Other long term (current) drug therapy: Secondary | ICD-10-CM | POA: Diagnosis not present

## 2018-03-31 DIAGNOSIS — Z1211 Encounter for screening for malignant neoplasm of colon: Secondary | ICD-10-CM | POA: Insufficient documentation

## 2018-03-31 DIAGNOSIS — D490 Neoplasm of unspecified behavior of digestive system: Secondary | ICD-10-CM | POA: Diagnosis not present

## 2018-03-31 DIAGNOSIS — K5730 Diverticulosis of large intestine without perforation or abscess without bleeding: Secondary | ICD-10-CM | POA: Insufficient documentation

## 2018-03-31 DIAGNOSIS — I10 Essential (primary) hypertension: Secondary | ICD-10-CM | POA: Insufficient documentation

## 2018-03-31 DIAGNOSIS — E785 Hyperlipidemia, unspecified: Secondary | ICD-10-CM | POA: Diagnosis not present

## 2018-03-31 DIAGNOSIS — D123 Benign neoplasm of transverse colon: Secondary | ICD-10-CM | POA: Insufficient documentation

## 2018-03-31 DIAGNOSIS — Z7984 Long term (current) use of oral hypoglycemic drugs: Secondary | ICD-10-CM | POA: Diagnosis not present

## 2018-03-31 DIAGNOSIS — Z87891 Personal history of nicotine dependence: Secondary | ICD-10-CM | POA: Insufficient documentation

## 2018-03-31 DIAGNOSIS — E119 Type 2 diabetes mellitus without complications: Secondary | ICD-10-CM | POA: Insufficient documentation

## 2018-03-31 DIAGNOSIS — Z6841 Body Mass Index (BMI) 40.0 and over, adult: Secondary | ICD-10-CM | POA: Diagnosis not present

## 2018-03-31 DIAGNOSIS — Z79890 Hormone replacement therapy (postmenopausal): Secondary | ICD-10-CM | POA: Insufficient documentation

## 2018-03-31 HISTORY — PX: COLONOSCOPY WITH PROPOFOL: SHX5780

## 2018-03-31 LAB — POCT PREGNANCY, URINE: Preg Test, Ur: NEGATIVE

## 2018-03-31 LAB — GLUCOSE, CAPILLARY: Glucose-Capillary: 161 mg/dL — ABNORMAL HIGH (ref 70–99)

## 2018-03-31 SURGERY — COLONOSCOPY WITH PROPOFOL
Anesthesia: General

## 2018-03-31 MED ORDER — SODIUM CHLORIDE 0.9 % IV SOLN
INTRAVENOUS | Status: DC
  Administered 2018-03-31: 1000 mL via INTRAVENOUS

## 2018-03-31 MED ORDER — PROPOFOL 500 MG/50ML IV EMUL
INTRAVENOUS | Status: DC | PRN
  Administered 2018-03-31: 140 ug/kg/min via INTRAVENOUS

## 2018-03-31 MED ORDER — PROPOFOL 10 MG/ML IV BOLUS
INTRAVENOUS | Status: DC | PRN
  Administered 2018-03-31: 70 mg via INTRAVENOUS
  Administered 2018-03-31: 30 mg via INTRAVENOUS
  Administered 2018-03-31: 20 mg via INTRAVENOUS
  Administered 2018-03-31 (×2): 30 mg via INTRAVENOUS
  Administered 2018-03-31: 20 mg via INTRAVENOUS

## 2018-03-31 NOTE — H&P (Signed)
Anita Bellows, MD [address] [place], [place], [place], [address] [address], [place], [place], [place], [address] Phone: [phone]  Fax: [phone]  Primary Care Physician:  Anita Hess, MD   Pre-Procedure History & Physical: HPI:  Anita Krueger is a 58 y.o. female is here for an colonoscopy.   Past Medical History:  Diagnosis Date  . Arthritis   . Bell's palsy 2016   right eye droops  . Diabetes mellitus without complication ([place])   . Hyperlipidemia   . Hypertension     Past Surgical History:  Procedure Laterality Date  . bell's palsy  2016   right eye sluggish  . DILATATION & CURETTAGE/HYSTEROSCOPY WITH MYOSURE N/A 03/31/2017   Procedure: DILATATION & CURETTAGE/HYSTEROSCOPY WITH MYOSURE;  Surgeon: Anita Bonnet, MD;  Location: [facility] ORS;  Service: Gynecology;  Laterality: N/A;  . LAPAROSCOPY      Prior to Admission medications   Medication Sig Start Date End Date Taking? Authorizing Provider  albuterol (PROVENTIL HFA;VENTOLIN HFA) 108 (90 Base) MCG/ACT inhaler Inhale into the lungs every 6 (six) hours as needed for wheezing or shortness of breath.   Yes [provider]  amLODipine (NORVASC) 5 MG tablet Take 1 tablet (5 mg total) by mouth daily. 01/14/17  Yes Anita Hess, MD  Carboxymethylcellul-Glycerin (CLEAR EYES FOR DRY EYES) 1-0.25 % SOLN Place 1 drop into both eyes 3 (three) times daily as needed (dry eyes).   Yes [provider]  carboxymethylcellulose (REFRESH PLUS) 0.5 % SOLN Place 1 drop into both eyes 3 (three) times daily as needed (dry eyes).   Yes [provider]  loratadine (CLARITIN) 10 MG tablet Take 10 mg by mouth daily as needed for allergies.   Yes [provider]  medroxyPROGESTERone (PROVERA) 10 MG tablet Take 1 tablet (10 mg total) by mouth daily. Take days 1-10 each month 10/28/17  Yes Anita Bonnet, MD  metFORMIN (GLUCOPHAGE) 500 MG tablet TAKE 1 TABLET BY MOUTH TWO  TIMES DAILY  WITH MEALS 12/26/17  Yes Anita Hess, MD  Multiple Vitamins-Minerals (DAILY MULTIVITAMIN PO) Take 2 tablets by mouth daily.   Yes [provider]  olmesartan-hydrochlorothiazide (BENICAR HCT) 40-25 MG tablet Take 1 tablet by mouth daily. 03/23/18  Yes Anita Hess, MD  ONE TOUCH ULTRA TEST test strip USE 1 STRIP UP TO 4 TIMES  DAILY AS DIRECTED 03/11/17  Yes Anita Hess, MD  [facility] DELICA LANCETS FINE MISC USE 1 LANCET UP TO 4 TIMES  DAILY AS DIRECTED 03/11/17  Yes Anita Hess, MD  pravastatin (PRAVACHOL) 20 MG tablet Take 1 tablet (20 mg total) by mouth daily. 05/06/17  Yes Anita Hess, MD    Allergies as of 01/30/2018  . (No Known Allergies)    Family History  Problem Relation Age of Onset  . Hypertension Mother   . Diabetes Mother   . Hypertension Father   . Breast cancer Cousin        Anita COUSIN    Social History   Socioeconomic History  . Marital status: Single    Spouse name: Not on file  . Number of children: Not on file  . Years of education: Not on file  . Highest education level: Not on file  Occupational History  . Not on file  Social Needs  . Financial resource strain: Not on file  . Food insecurity:    Worry: Not on file    Inability: Not  on file  . Transportation needs:    Medical: Not on file    Non-medical: Not on file  Tobacco Use  . Smoking status: Former Smoker    Types: Cigarettes  . Smokeless tobacco: Never Used  . Tobacco comment: 40 years ago  Substance and Sexual Activity  . Alcohol use: Yes    Alcohol/week: 2.0 standard drinks    Types: 2 Shots of liquor per week    Comment: occasional  . Drug use: No  . Sexual activity: Not Currently    Birth control/protection: None  Lifestyle  . Physical activity:    Days per week: Not on file    Minutes per session: Not on file  . Stress: Not on file  Relationships  . Social connections:    Talks on phone: Not on file    Gets together: Not on file    Attends  religious service: Not on file    Active member of club or organization: Not on file    Attends meetings of clubs or organizations: Not on file    Relationship status: Not on file  . Intimate partner violence:    Fear of current or ex partner: Not on file    Emotionally abused: Not on file    Physically abused: Not on file    Forced sexual activity: Not on file  Other Topics Concern  . Not on file  Social History Narrative  . Not on file    Review of Systems: See HPI, otherwise negative ROS  Physical Exam: BP (!) 145/103   Pulse 86   Temp (!) 96.8 F (36 C) (Tympanic)   Resp 20   Ht 5\' 5"  (1.651 m)   Wt 121.6 kg   SpO2 98%   BMI 44.60 kg/m  General:   Alert,  pleasant and cooperative in NAD Head:  Normocephalic and atraumatic. Neck:  Supple; no masses or thyromegaly. Lungs:  Clear throughout to auscultation, normal respiratory effort.    Heart:  +S1, +S2, Regular rate and rhythm, No edema. Abdomen:  Soft, nontender and nondistended. Normal bowel sounds, without guarding, and without rebound.   Neurologic:  Alert and  oriented x4;  grossly normal neurologically.  Impression/Plan: MERSADIE Krueger is here for an colonoscopy to be performed for Screening colonoscopy average risk   Risks, benefits, limitations, and alternatives regarding  colonoscopy have been reviewed with the patient.  Questions have been answered.  All parties agreeable.   Anita Bellows, MD  03/31/2018, 9:57 AM

## 2018-03-31 NOTE — Anesthesia Post-op Follow-up Note (Signed)
Anesthesia QCDR form completed.        

## 2018-03-31 NOTE — Anesthesia Postprocedure Evaluation (Signed)
Anesthesia Post Note  Patient: Anita Krueger  Procedure(s) Performed: COLONOSCOPY WITH PROPOFOL (N/A )  Patient location during evaluation: Endoscopy Anesthesia Type: General Level of consciousness: awake and alert Pain management: pain level controlled Vital Signs Assessment: post-procedure vital signs reviewed and stable Respiratory status: spontaneous breathing and respiratory function stable Cardiovascular status: stable Anesthetic complications: no     Last Vitals:  Vitals:   03/31/18 0840 03/31/18 1025  BP: (!) 145/103 108/66  Pulse: 86 (!) 116  Resp: 20 (!) 23  Temp: (!) 36 C (!) 36.2 C  SpO2: 98% 99%    Last Pain:  Vitals:   03/31/18 1025  TempSrc: Tympanic  PainSc: 0-No pain                 KEPHART,WILLIAM K

## 2018-03-31 NOTE — Anesthesia Preprocedure Evaluation (Signed)
Anesthesia Evaluation  Patient identified by MRN, date of birth, ID band Patient awake    Reviewed: Allergy & Precautions, NPO status , Patient's Chart, lab work & pertinent test results  History of Anesthesia Complications Negative for: history of anesthetic complications  Airway Mallampati: III       Dental   Pulmonary neg sleep apnea, neg COPD, former smoker,           Cardiovascular hypertension, Pt. on medications (-) Past MI and (-) CHF (-) dysrhythmias (-) Valvular Problems/Murmurs     Neuro/Psych neg Seizures    GI/Hepatic Neg liver ROS, neg GERD  ,  Endo/Other  diabetes, Type 2, Oral Hypoglycemic AgentsMorbid obesity  Renal/GU negative Renal ROS     Musculoskeletal   Abdominal   Peds  Hematology  (+) anemia ,   Anesthesia Other Findings   Reproductive/Obstetrics                             Anesthesia Physical Anesthesia Plan  ASA: III  Anesthesia Plan: General   Post-op Pain Management:    Induction: Intravenous  PONV Risk Score and Plan: 3 and Propofol infusion, TIVA, Midazolam and Treatment may vary due to age or medical condition  Airway Management Planned: Nasal Cannula  Additional Equipment:   Intra-op Plan:   Post-operative Plan:   Informed Consent: I have reviewed the patients History and Physical, chart, labs and discussed the procedure including the risks, benefits and alternatives for the proposed anesthesia with the patient or authorized representative who has indicated his/her understanding and acceptance.       Plan Discussed with:   Anesthesia Plan Comments:         Anesthesia Quick Evaluation

## 2018-03-31 NOTE — Op Note (Signed)
[facility] Gastroenterology Patient Name: Anita Krueger Procedure Date: 03/31/2018 10:01 AM MRN: [id number] Account #: [id number] Date of Birth: 1960/10/24 Admit Type: Outpatient Age: 58 Room: [facility] ENDO ROOM 4 Gender: Female Note Status: Finalized Procedure:            Colonoscopy Indications:          Screening for colorectal malignant neoplasm Providers:            Jonathon Bellows MD, MD Referring MD:         Halina Maidens, MD (Referring MD) Medicines:            Monitored Anesthesia Care Complications:        No immediate complications. Procedure:            Pre-Anesthesia Assessment:                       - ASA Grade Assessment: III - A patient with severe                        systemic disease.                       After obtaining informed consent, the colonoscope was                        passed under direct vision. Throughout the procedure,                        the patient's blood pressure, pulse, and oxygen                        saturations were monitored continuously. The                        Colonoscope was introduced through the anus and                        advanced to the the cecum, identified by the                        appendiceal orifice, IC valve and transillumination.                        The colonoscopy was performed with ease. The patient                        tolerated the procedure well. The quality of the bowel                        preparation was good. Findings:      The perianal and digital rectal examinations were normal.      A 3 mm polyp was found in the transverse colon. The polyp was sessile.       The polyp was removed with a cold biopsy forceps. Resection and       retrieval were complete.      A 15 mm polypoid lesion was found in the rectum. The lesion was       semi-pedunculated. No bleeding was present.      The exam was otherwise without abnormality on direct and retroflexion       views.  Multiple  small-mouthed diverticula were found in the entire colon. Impression:           - One 3 mm polyp in the transverse colon, removed with                        a cold biopsy forceps. Resected and retrieved.                       - Polypoid lesion in the rectum.                       - The examination was otherwise normal on direct and                        retroflexion views. Recommendation:       - Discharge patient to home (with escort).                       - Resume previous diet.                       - Continue present medications.                       - Await pathology results.                       - Repeat colonoscopy for surveillance based on                        pathology results.                       - Refer to Dr Dahlia Byes to resect polypoid lesion in the                        distal rectum/anus likely thrombosed hemorroid but had                        abnormal appearing mucosa Procedure Code(s):    --- Professional ---                       [phone], Colonoscopy, flexible; with biopsy, single or                        multiple Diagnosis Code(s):    --- Professional ---                       Z12.11, Encounter for screening for malignant neoplasm                        of colon                       D12.3, Benign neoplasm of transverse colon (hepatic                        flexure or splenic flexure)                       D49.0, Neoplasm of unspecified behavior of digestive  system CPT copyright 2018 American Medical Association. All rights reserved. The codes documented in this report are preliminary and upon coder review may  be revised to meet current compliance requirements. Jonathon Bellows, MD Jonathon Bellows MD, MD 03/31/2018 10:24:22 AM This report has been signed electronically. Number of Addenda: 0 Note Initiated On: 03/31/2018 10:01 AM Scope Withdrawal Time: 0 hours 11 minutes 20 seconds  Total Procedure Duration: 0 hours 14 minutes 31 seconds        [employer]

## 2018-03-31 NOTE — Transfer of Care (Signed)
Immediate Anesthesia Transfer of Care Note  Patient: Anita Krueger  Procedure(s) Performed: COLONOSCOPY WITH PROPOFOL (N/A )  Patient Location: PACU  Anesthesia Type:General  Level of Consciousness: sedated  Airway & Oxygen Therapy: Patient Spontanous Breathing and Patient connected to nasal cannula oxygen  Post-op Assessment: Report given to RN and Post -op Vital signs reviewed and stable  Post vital signs: Reviewed and stable  Last Vitals:  Vitals Value Taken Time  BP 108/66 03/31/2018 10:25 AM  Temp 36.2 C 03/31/2018 10:25 AM  Pulse 115 03/31/2018 10:25 AM  Resp 19 03/31/2018 10:25 AM  SpO2 97 % 03/31/2018 10:25 AM  Vitals shown include unvalidated device data.  Last Pain:  Vitals:   03/31/18 1025  TempSrc:   PainSc: 0-No pain         Complications: No apparent anesthesia complications

## 2018-04-03 ENCOUNTER — Encounter: Payer: Self-pay | Admitting: Internal Medicine

## 2018-04-03 ENCOUNTER — Telehealth: Payer: Self-pay

## 2018-04-03 ENCOUNTER — Other Ambulatory Visit: Payer: Self-pay

## 2018-04-03 ENCOUNTER — Encounter: Payer: Self-pay | Admitting: Gastroenterology

## 2018-04-03 DIAGNOSIS — D490 Neoplasm of unspecified behavior of digestive system: Secondary | ICD-10-CM

## 2018-04-03 DIAGNOSIS — D126 Benign neoplasm of colon, unspecified: Secondary | ICD-10-CM | POA: Insufficient documentation

## 2018-04-03 LAB — SURGICAL PATHOLOGY

## 2018-04-03 NOTE — Telephone Encounter (Signed)
-----   Message from Jonathon Bellows, MD sent at 03/31/2018 10:28 AM EST ----- Regarding: please arrange appointment  Styles Fambro  Please arrange referral to Dr Dahlia Byes for polypoid lesion in anus/distal rectum seen on colonoscopy    Regards    Dr Jonathon Bellows  Gastroenterology/Hepatology Pager: [phone]

## 2018-04-03 NOTE — Telephone Encounter (Signed)
Spoke with pt and informed her that we have referred her to Dr. Dahlia Byes at [facility].

## 2018-04-25 ENCOUNTER — Telehealth: Payer: Self-pay | Admitting: Obstetrics and Gynecology

## 2018-04-25 NOTE — Telephone Encounter (Signed)
Patient is rescheduling due to the fear of the Coronavirus and wants to move appointment from this Thursday to April. Patient isn't sure if she already has refill but is requesting that she does to get to her next schedule appointment for April. Please advise

## 2018-04-27 NOTE — Telephone Encounter (Signed)
Pt should have enough to last

## 2018-04-28 ENCOUNTER — Ambulatory Visit: Payer: Managed Care, Other (non HMO) | Admitting: Obstetrics and Gynecology

## 2018-05-11 ENCOUNTER — Other Ambulatory Visit: Payer: Self-pay

## 2018-05-11 ENCOUNTER — Ambulatory Visit (INDEPENDENT_AMBULATORY_CARE_PROVIDER_SITE_OTHER): Payer: Managed Care, Other (non HMO) | Admitting: Obstetrics and Gynecology

## 2018-05-11 ENCOUNTER — Encounter: Payer: Self-pay | Admitting: Obstetrics and Gynecology

## 2018-05-11 ENCOUNTER — Other Ambulatory Visit: Payer: Self-pay | Admitting: Internal Medicine

## 2018-05-11 DIAGNOSIS — N8501 Benign endometrial hyperplasia: Secondary | ICD-10-CM | POA: Diagnosis not present

## 2018-05-11 MED ORDER — MEGESTROL ACETATE 20 MG PO TABS: 20.0000 mg | ORAL_TABLET | Freq: Every day | ORAL | 1 refills | Status: DC

## 2018-05-11 NOTE — Progress Notes (Signed)
Virtual Visit via Telephone Note  I connected with Anita Krueger on 05/11/18 at  8:50 AM EDT by telephone and verified that I am speaking with the correct person using two identifiers.   I discussed the limitations, risks, security and privacy concerns of performing an evaluation and management service by telephone and the availability of in person appointments. I also discussed with the patient that there may be a patient responsible charge related to this service. The patient expressed understanding and agreed to proceed.  She was at work and I was in my office.  History of Present Illness: 58 y.o. G80P0020 female who presents via telephone for complex endometrial hyperplasia without atypia.  She states her bleeding is about the same. Her bleeding is not as heavy. She still has a cycle that lasts about 7 days.  The bleeding is more consistent. The last one only lasted about 5-7 days. She has had no side effects of the medication.      Observations/Objective: No exam today, due to telephone eVisit due to [facility] virus restriction on elective visits and procedures.  Prior visits reviewed along with ultrasounds/labs as indicated.  Assessment and Plan: 58 y.o. G18P0020 female with complex endometrial hyperplasia without atypia.  Change medication to Megace daily, 20 mg. Follow-up in 6 months in clinic, if possible.  Consider biopsy at that time.  Follow Up Instructions:  I discussed the assessment and treatment plan with the patient. The patient was provided an opportunity to ask questions and all were answered. The patient agreed with the plan and demonstrated an understanding of the instructions.   The patient was advised to call back or seek an in-person evaluation if the symptoms worsen or if the condition fails to improve as anticipated.  I provided 22 minutes of non-face-to-face time during this encounter.  Prentice Docker, MD, [facility], [facility] 05/11/2018  9:12 AM

## 2018-05-24 ENCOUNTER — Other Ambulatory Visit: Payer: Self-pay | Admitting: Internal Medicine

## 2018-05-26 ENCOUNTER — Ambulatory Visit: Payer: Managed Care, Other (non HMO) | Admitting: Internal Medicine

## 2018-05-26 ENCOUNTER — Encounter: Payer: Self-pay | Admitting: Internal Medicine

## 2018-05-26 ENCOUNTER — Other Ambulatory Visit: Payer: Self-pay

## 2018-05-26 VITALS — BP 122/81 | HR 89 | Resp 16 | Ht 65.0 in | Wt 272.0 lb

## 2018-05-26 DIAGNOSIS — F39 Unspecified mood [affective] disorder: Secondary | ICD-10-CM

## 2018-05-26 DIAGNOSIS — E118 Type 2 diabetes mellitus with unspecified complications: Secondary | ICD-10-CM

## 2018-05-26 DIAGNOSIS — I10 Essential (primary) hypertension: Secondary | ICD-10-CM | POA: Diagnosis not present

## 2018-05-26 DIAGNOSIS — D509 Iron deficiency anemia, unspecified: Secondary | ICD-10-CM

## 2018-05-26 DIAGNOSIS — E1169 Type 2 diabetes mellitus with other specified complication: Secondary | ICD-10-CM | POA: Diagnosis not present

## 2018-05-26 DIAGNOSIS — E785 Hyperlipidemia, unspecified: Secondary | ICD-10-CM

## 2018-05-26 NOTE — Progress Notes (Signed)
Date:  05/26/2018   Name:  Anita Krueger   DOB:  [birth year]   MRN:  [id number]   Chief Complaint: Diabetes (BS 130-150)  Diabetes  She presents for her follow-up diabetic visit. She has type 2 diabetes mellitus. Her disease course has been fluctuating. Pertinent negatives for hypoglycemia include no headaches or tremors. Pertinent negatives for diabetes include no chest pain, no fatigue, no polydipsia and no polyuria. Current diabetic treatment includes oral agent (monotherapy). She is compliant with treatment all of the time. She is following a generally healthy diet. She monitors blood glucose at home 3-4 x per week. Her breakfast blood glucose range is generally 140-180 mg/dl. An ACE inhibitor/angiotensin II receptor blocker is being taken. Eye exam is not current.  Hypertension  This is a chronic problem. The problem is controlled. Pertinent negatives include no chest pain, headaches, palpitations or shortness of breath. Past treatments include angiotensin blockers, diuretics and calcium channel blockers. The current treatment provides significant improvement.  Hyperlipidemia  The problem is controlled. Pertinent negatives include no chest pain or shortness of breath. Current antihyperlipidemic treatment includes statins. The current treatment provides significant improvement of lipids.  Depression         This is a new problem.  Associated symptoms include insomnia and appetite change.  Associated symptoms include no fatigue, no headaches and no suicidal ideas.     The symptoms are aggravated by work stress and social issues.  Past treatments include nothing. Anemia  Presents for follow-up visit. There has been no abdominal pain, bruising/bleeding easily, fever or palpitations. (Taking iron; colonoscopy was normal)  There are no compliance problems.   Also on Provera from GYN for 10 days; will start daily next month to stop menses completely.  Lab Results  Component Value Date   HGBA1C  6.1 (H) 01/20/2018   Lab Results  Component Value Date   CREATININE 0.57 01/20/2018   BUN 12 01/20/2018   NA 142 01/20/2018   K 4.1 01/20/2018   CL 96 01/20/2018   CO2 19 (L) 01/20/2018   Lab Results  Component Value Date   CHOL 147 01/20/2018   HDL 32 (L) 01/20/2018   LDLCALC 93 01/20/2018   TRIG 109 01/20/2018   CHOLHDL 4.6 (H) 01/20/2018     Review of Systems  Constitutional: Positive for appetite change. Negative for fatigue, fever and unexpected weight change.  HENT: Negative for ear pain, tinnitus and trouble swallowing.   Eyes: Negative for visual disturbance.  Respiratory: Negative for cough, chest tightness, shortness of breath and wheezing.   Cardiovascular: Negative for chest pain, palpitations and leg swelling.  Gastrointestinal: Negative for abdominal pain.  Endocrine: Negative for polydipsia and polyuria.  Genitourinary: Negative for dysuria and hematuria.  Musculoskeletal: Negative for arthralgias.  Allergic/Immunologic: Negative for environmental allergies.  Neurological: Negative for tremors, numbness and headaches.  Hematological: Does not bruise/bleed easily.  Psychiatric/Behavioral: Positive for depression. Negative for dysphoric mood and suicidal ideas. The patient has insomnia.     Patient Active Problem List   Diagnosis Date Noted  . Tubular adenoma of colon 04/03/2018  . Iron deficiency anemia 01/24/2018  . 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 Menorrhagia with irregular cycle 01/19/2017  . Hip pain, acute, left 05/12/2016  . Essential hypertension 04/04/2015  . DM (diabetes mellitus) with complications ([place]) 81/19/1478  . Hyperlipidemia associated with type 2 diabetes mellitus ([place]) 09/29/2014    No Known Allergies  Past Surgical History:  Procedure Laterality Date  .  bell's palsy  2016   right eye sluggish  . COLONOSCOPY WITH PROPOFOL N/A 03/31/2018   Procedure: COLONOSCOPY WITH PROPOFOL;  Surgeon: Jonathon Bellows,  MD;  Location: [facility] ENDOSCOPY;  Service: Gastroenterology;  Laterality: N/A;  . DILATATION & CURETTAGE/HYSTEROSCOPY WITH MYOSURE N/A 03/31/2017   Procedure: DILATATION & CURETTAGE/HYSTEROSCOPY WITH MYOSURE;  Surgeon: [name], MD;  Location: [facility] ORS;  Service: Gynecology;  Laterality: N/A;  . LAPAROSCOPY      Social History   Tobacco Use  . Smoking status: Former Smoker    Types: Cigarettes  . Smokeless tobacco: Never Used  . Tobacco comment: 40 years ago  Substance Use Topics  . Alcohol use: Yes    Alcohol/week: 2.0 standard drinks    Types: 2 Shots of liquor per week    Comment: occasional  . Drug use: No     Medication list has been reviewed and updated.  Current Meds  Medication Sig  . amLODipine (NORVASC) 5 MG tablet Take 1 tablet (5 mg total) by mouth daily.  . Carboxymethylcellul-Glycerin (CLEAR EYES FOR DRY EYES) 1-0.25 % SOLN Place 1 drop into both eyes 3 (three) times daily as needed (dry eyes).  . carboxymethylcellulose (REFRESH PLUS) 0.5 % SOLN Place 1 drop into both eyes 3 (three) times daily as needed (dry eyes).  Marland Kitchen loratadine (CLARITIN) 10 MG tablet Take 10 mg by mouth daily as needed for allergies.  . megestrol (MEGACE) 20 MG tablet Take 1 tablet (20 mg total) by mouth daily. (Patient taking differently: Take 20 mg by mouth daily. 1 month-OBGYN)  . metFORMIN (GLUCOPHAGE) 500 MG tablet TAKE 1 TABLET BY MOUTH TWO  TIMES DAILY WITH MEALS  . Multiple Vitamins-Minerals (DAILY MULTIVITAMIN PO) Take 2 tablets by mouth daily.  . ONE TOUCH ULTRA TEST test strip USE 1 STRIP UP TO 4 TIMES  DAILY AS DIRECTED  . ONETOUCH DELICA LANCETS FINE MISC USE 1 LANCET UP TO 4 TIMES  DAILY AS DIRECTED  . pravastatin (PRAVACHOL) 20 MG tablet TAKE 1 TABLET BY MOUTH  DAILY    PHQ 2/9 Scores 05/26/2018 01/20/2018 02/07/2017 01/14/2017  PHQ - 2 Score 0 0 0 0  PHQ- 9 Score 10 - - -    BP Readings from Last 3 Encounters:  05/26/18 122/81  03/31/18 (!) 151/94  01/20/18 136/82     Physical Exam Vitals signs and nursing note reviewed.  Constitutional:      General: She is not in acute distress.    Appearance: She is well-developed.  HENT:     Head: Normocephalic and atraumatic.  Pulmonary:     Effort: Pulmonary effort is normal. No respiratory distress.  Musculoskeletal: Normal range of motion.  Skin:    General: Skin is warm and dry.     Findings: No rash.  Neurological:     Mental Status: She is alert and oriented to person, place, and time.  Psychiatric:        Attention and Perception: Attention normal.        Mood and Affect: Mood normal.        Behavior: Behavior normal.        Thought Content: Thought content normal. Thought content does not include suicidal plan.     Wt Readings from Last 3 Encounters:  05/26/18 272 lb (123.4 kg)  03/31/18 268 lb (121.6 kg)  01/20/18 275 lb 3.2 oz (124.8 kg)    BP 122/81   Pulse 89   Resp 16   Ht 5\' 5"  (1.651  m)   Wt 272 lb (123.4 kg)   LMP 05/22/2018 (Exact Date)   SpO2 98%   BMI 45.26 kg/m   Assessment and Plan: 1. DM (diabetes mellitus) with complications ([place]) Not a well controlled, check labs and advise on medication changes Discussed need for regular exercise - Hemoglobin A1c  2. Hyperlipidemia associated with type 2 diabetes mellitus ([place]) On statin therapy  3. Essential hypertension controlled  4. Mood disorder ([place]) Situational; pt counseled to follow up if sx are worsening  5. Iron deficiency anemia, unspecified iron deficiency anemia type Improved stamina on iron Also changing provera to daily per GYN - CBC with Differential/Platelet   Partially dictated using Editor, commissioning. Any errors are unintentional.  Halina Maidens, MD [facility]  05/26/2018

## 2018-05-26 NOTE — Patient Instructions (Addendum)
Schedule DM Eye exam.  This information is directly available on the CDC website: RunningShows.co.za.html    Source:CDC Reference to specific commercial products, manufacturers, companies, or trademarks does not constitute its endorsement or recommendation by the [place], [place], or Centers for [employer] and Prevention.

## 2018-05-27 LAB — CBC WITH DIFFERENTIAL/PLATELET
Basophils Absolute: 0 10*3/uL (ref 0.0–0.2)
Basos: 0 %
EOS (ABSOLUTE): 0.1 10*3/uL (ref 0.0–0.4)
Eos: 1 %
Hematocrit: 42.5 % (ref 34.0–46.6)
Hemoglobin: 14.4 g/dL (ref 11.1–15.9)
Immature Grans (Abs): 0.1 10*3/uL (ref 0.0–0.1)
Immature Granulocytes: 1 %
Lymphocytes Absolute: 2.7 10*3/uL (ref 0.7–3.1)
Lymphs: 27 %
MCH: 28.6 pg (ref 26.6–33.0)
MCHC: 33.9 g/dL (ref 31.5–35.7)
MCV: 85 fL (ref 79–97)
Monocytes Absolute: 0.6 10*3/uL (ref 0.1–0.9)
Monocytes: 7 %
Neutrophils Absolute: 6.4 10*3/uL (ref 1.4–7.0)
Neutrophils: 64 %
Platelets: 299 10*3/uL (ref 150–450)
RBC: 5.03 x10E6/uL (ref 3.77–5.28)
RDW: 13.5 % (ref 11.7–15.4)
WBC: 9.9 10*3/uL (ref 3.4–10.8)

## 2018-05-27 LAB — HEMOGLOBIN A1C
Est. average glucose Bld gHb Est-mCnc: 154 mg/dL
Hgb A1c MFr Bld: 7 % — ABNORMAL HIGH (ref 4.8–5.6)

## 2018-06-22 ENCOUNTER — Other Ambulatory Visit: Payer: Self-pay | Admitting: Internal Medicine

## 2018-07-17 ENCOUNTER — Telehealth: Payer: Self-pay

## 2018-07-17 NOTE — Telephone Encounter (Signed)
Patient called requesting to have an order sent over to the lab of her choice for Covid Antibody Testing.   Called patient and spoke with her. Informed her we do not do any Covid Antibody Testing. As far as I know, told her we think she can just walk in any [facility] and have the testing done without an order. Told patient we do not do anything with these results or any antibody testing.  She verbalized understanding.

## 2018-08-15 ENCOUNTER — Encounter: Payer: Self-pay | Admitting: Gastroenterology

## 2018-08-15 ENCOUNTER — Telehealth: Payer: Self-pay

## 2018-08-15 NOTE — Telephone Encounter (Signed)
Called patient and spoke with her. She said she put the general surgeon referral off because she cannot afford surgery right now. Told her GI is sending her a letter about contacting the General Surgeon for an appt. Told her to contact General Surgery when she gets it and discuss pricing and cost with them.  She verbalized understanding of this.

## 2018-08-15 NOTE — Telephone Encounter (Signed)
-----   Message from Glean Hess, MD sent at 08/15/2018  1:45 PM EDT ----- Apparently [name] had a rectal mass/polyp seen on colonoscopy earlier this year that needs to be removed by Gen surgery.  The referral was made but when they called the patient, she told them that she has spoken with her PCP and was feeling better so did not want anything done.  She did not speak to me about this because I would definitely have urged her to get it taken care of. She needs to go now and have the mass removed.  Please let her know this and I will do another referral.

## 2018-08-23 ENCOUNTER — Other Ambulatory Visit: Payer: Self-pay | Admitting: Internal Medicine

## 2018-09-18 ENCOUNTER — Other Ambulatory Visit (HOSPITAL_COMMUNITY)
Admission: RE | Admit: 2018-09-18 | Discharge: 2018-09-18 | Disposition: A | Discharge status: Home / Self Care | Payer: Managed Care, Other (non HMO) | Source: Ambulatory Visit | Attending: Obstetrics and Gynecology | Admitting: Obstetrics and Gynecology

## 2018-09-18 ENCOUNTER — Other Ambulatory Visit: Payer: Self-pay

## 2018-09-18 ENCOUNTER — Ambulatory Visit (INDEPENDENT_AMBULATORY_CARE_PROVIDER_SITE_OTHER): Payer: Managed Care, Other (non HMO) | Admitting: Obstetrics and Gynecology

## 2018-09-18 VITALS — BP 132/88 | Ht 65.0 in | Wt 265.0 lb

## 2018-09-18 DIAGNOSIS — N8501 Benign endometrial hyperplasia: Secondary | ICD-10-CM

## 2018-09-18 DIAGNOSIS — N8502 Endometrial intraepithelial neoplasia [EIN]: Secondary | ICD-10-CM | POA: Diagnosis not present

## 2018-09-18 NOTE — Progress Notes (Signed)
Obstetrics & Gynecology Office Visit   Chief Complaint:  Chief Complaint  Patient presents with   Follow-up   Menstrual Problem   Consult  Follow up complex endometrial hyperplasia without atypia.  History of Present Illness: 58 y.o. G29P0020 female who presents in follow up of the above.  She had a telephone visit in April. Her medication was changed at that time from Provera to Megace.  On this medication she has nearly daily bleeding. Sometimes she passes clots.  But, she notes bleeding nearly every time she goes to the bathroom. She continues to take megace without side effects.  She denies new symptoms of bloating, early satiety, weight loss, and constipation.  Past Medical History:  Diagnosis Date   Arthritis    Bell's palsy 2016   right eye droops   Diabetes mellitus without complication ([place])    Hyperlipidemia    Hypertension     Past Surgical History:  Procedure Laterality Date   bell's palsy  2016   right eye sluggish   COLONOSCOPY WITH PROPOFOL N/A 03/31/2018   Procedure: COLONOSCOPY WITH PROPOFOL;  Surgeon: Jonathon Bellows, MD;  Location: [facility] ENDOSCOPY;  Service: Gastroenterology;  Laterality: N/A;   DILATATION & CURETTAGE/HYSTEROSCOPY WITH MYOSURE N/A 03/31/2017   Procedure: DILATATION & CURETTAGE/HYSTEROSCOPY WITH MYOSURE;  Surgeon: [name], MD;  Location: [facility] ORS;  Service: Gynecology;  Laterality: N/A;   LAPAROSCOPY      Gynecologic History: No LMP recorded. (Menstrual status: Irregular Periods).  Obstetric History: G2P0020  Family History  Problem Relation Age of Onset   Hypertension Mother    Diabetes Mother    Hypertension Father    Breast cancer Cousin        PAT COUSIN    Social History   Socioeconomic History   Marital status: Single    Spouse name: Not on file   Number of children: Not on file   Years of education: Not on file   Highest education level: Not on file  Occupational History   Not on file  Social  Needs   Financial resource strain: Not on file   Food insecurity    Worry: Not on file    Inability: Not on file   Transportation needs    Medical: Not on file    Non-medical: Not on file  Tobacco Use   Smoking status: Former Smoker    Types: Cigarettes   Smokeless tobacco: Never Used   Tobacco comment: 40 years ago  Substance and Sexual Activity   Alcohol use: Yes    Alcohol/week: 2.0 standard drinks    Types: 2 Shots of liquor per week    Comment: occasional   Drug use: No   Sexual activity: Not Currently    Birth control/protection: None  Lifestyle   Physical activity    Days per week: Not on file    Minutes per session: Not on file   Stress: Not on file  Relationships   Social connections    Talks on phone: Not on file    Gets together: Not on file    Attends religious service: Not on file    Active member of club or organization: Not on file    Attends meetings of clubs or organizations: Not on file    Relationship status: Not on file   Intimate partner violence    Fear of current or ex partner: Not on file    Emotionally abused: Not on file    Physically abused: Not on file  Forced sexual activity: Not on file  Other Topics Concern   Not on file  Social History Narrative   Not on file    No Known Allergies  Prior to Admission medications   Medication Sig Start Date End Date Taking? Authorizing Provider  albuterol (PROVENTIL HFA;VENTOLIN HFA) 108 (90 Base) MCG/ACT inhaler Inhale into the lungs every 6 (six) hours as needed for wheezing or shortness of breath.    [provider]  amLODipine (NORVASC) 5 MG tablet Take 1 tablet (5 mg total) by mouth daily. 01/14/17   Glean Hess, MD  Carboxymethylcellul-Glycerin (CLEAR EYES FOR DRY EYES) 1-0.25 % SOLN Place 1 drop into both eyes 3 (three) times daily as needed (dry eyes).    [provider]  carboxymethylcellulose (REFRESH PLUS) 0.5 % SOLN Place 1 drop into both eyes 3  (three) times daily as needed (dry eyes).    [provider]  loratadine (CLARITIN) 10 MG tablet Take 10 mg by mouth daily as needed for allergies.    [provider]  megestrol (MEGACE) 20 MG tablet Take 1 tablet (20 mg total) by mouth daily. Patient taking differently: Take 20 mg by mouth daily. 1 month-OBGYN 05/11/18   [name], MD  metFORMIN (GLUCOPHAGE) 500 MG tablet TAKE 1 TABLET BY MOUTH TWO  TIMES DAILY WITH MEALS 12/26/17   Glean Hess, MD  Multiple Vitamins-Minerals (DAILY MULTIVITAMIN PO) Take 2 tablets by mouth daily.    [provider]  olmesartan-hydrochlorothiazide (BENICAR HCT) 40-25 MG tablet TAKE 1 TABLET BY MOUTH DAILY 06/22/18   Glean Hess, MD  [facility] DELICA LANCETS FINE MISC USE 1 LANCET UP TO 4 TIMES  DAILY AS DIRECTED 03/11/17   Glean Hess, MD  [facility] ULTRA test strip USE 1 STRIP UP TO 4 TIMES  DAILY AS DIRECTED 08/23/18   Glean Hess, MD  pravastatin (PRAVACHOL) 20 MG tablet TAKE 1 TABLET BY MOUTH  DAILY 05/24/18   Glean Hess, MD    Review of Systems  Constitutional: Negative.   HENT: Negative.   Eyes: Negative.   Respiratory: Negative.   Cardiovascular: Negative.   Gastrointestinal: Negative.   Genitourinary: Negative.   Musculoskeletal: Negative.   Skin: Negative.   Neurological: Negative.   Psychiatric/Behavioral: Negative.      Physical Exam BP 132/88    Ht 5\' 5"  (1.651 m)    Wt 265 lb (120.2 kg)    BMI 44.10 kg/m  No LMP recorded. (Menstrual status: Irregular Periods). Physical Exam Constitutional:      General: She is not in acute distress.    Appearance: Normal appearance.  Genitourinary:     Pelvic exam was performed with patient in the lithotomy position.     Vulva, inguinal canal, urethra, vagina, cervix and uterus normal.     No posterior fourchette injury or lesion present.     Genitourinary Comments: Exam limited by body habitus  HENT:     Head: Normocephalic and atraumatic.    Eyes:     General: No scleral icterus.    Conjunctiva/sclera: Conjunctivae normal.  Neurological:     General: No focal deficit present.     Mental Status: She is alert and oriented to person, place, and time.     Cranial Nerves: No cranial nerve deficit.  Psychiatric:        Mood and Affect: Mood normal.        Behavior: Behavior normal.        Judgment: Judgment  normal.  Vitals signs reviewed. Exam conducted with a chaperone present.     Endometrial Biopsy After discussion with the patient regarding her abnormal uterine bleeding I recommended that she proceed with an endometrial biopsy for further diagnosis. The risks, benefits, alternatives, and indications for an endometrial biopsy were discussed with the patient in detail. She understood the risks including infection, bleeding, cervical laceration and uterine perforation.  Verbal consent was obtained.   PROCEDURE NOTE:  Pipelle endometrial biopsy was performed using aseptic technique with iodine preparation.  The uterus was sounded to a length of 7.5 cm.  Adequate sampling was obtained with minimal blood loss.  The patient tolerated the procedure well.  Disposition will be pending pathology.  Female chaperone present for pelvic and breast  portions of the physical exam  Assessment: 58 y.o. G1P0020 female here for  1. Complex endometrial hyperplasia without atypia      Plan: Problem List Items Addressed This Visit      Genitourinary   Complex endometrial hyperplasia without atypia - Primary   Relevant Orders   Surgical pathology     We will follow-up based on pathology results from today.  An endometrial biopsy was taken.  For now, continue Megace as previously prescribed.  15 minutes spent in face to face discussion with > 50% spent in counseling,management, and coordination of care of her complex endometrial hyperplasia without atypia.   Prentice Docker, MD 09/18/2018 11:00 AM

## 2018-09-19 ENCOUNTER — Encounter: Payer: Self-pay | Admitting: Obstetrics and Gynecology

## 2018-09-25 ENCOUNTER — Telehealth: Payer: Self-pay | Admitting: Obstetrics and Gynecology

## 2018-09-25 NOTE — Telephone Encounter (Signed)
Discussed biopsy results.  She continues to bleed on Megace.  We discussed ultrasound to assess for polyps, given biopsy results.  However, if no polyp is visualized, her continued bleeding in the setting of her pathology result does not make sense and I would likely recommend a hysteroscopy D&C anyway.  Mutual decision is made to proceed with hysteroscopy with D&C and possible polypectomy.  Will schedule.

## 2018-09-28 ENCOUNTER — Telehealth: Payer: Self-pay | Admitting: Obstetrics and Gynecology

## 2018-09-28 NOTE — Telephone Encounter (Signed)
-----   Message from [name], MD sent at 09/25/2018  5:47 PM EDT ----- Regarding: Schedule surgery Surgery Booking Request Patient Full Name:  Anita Krueger  MRN: [id number]  DOB: Feb 10, 1960  Surgeon: Prentice Docker, MD  Requested Surgery Date and Time: TBD (sooner is better) Primary Diagnosis AND Code: 1) complex endometrial hyperplasia without atypia (N85.01), 2) Endometrial polyp (N84.0) Secondary Diagnosis and Code:  Surgical Procedure: Hysteroscopy, Dilation and curettage, polypectomy L&D Notification: No Admission Status: same day surgery Length of Surgery: 35 minutes Special Case Needs: MyoSure H&P: TBD (could sign consents date of surgery) (date) Phone Interview???: no Interpreter: Language:  Medical Clearance: no Special Scheduling Instructions: no Acuity: P3

## 2018-09-28 NOTE — Telephone Encounter (Signed)
Patient is aware of h&p on day of surgery, Pre-admit testing to be scheduled, covid testing on 10/30/18, and OR on 11/02/18. Patient is aware she will be asked to quarantine after covid testing. Patient is aware she may receive calls from the [facility]. Patient confirmed Christella Scheuermann and no additional insurance.

## 2018-09-29 ENCOUNTER — Encounter: Payer: Self-pay | Admitting: Internal Medicine

## 2018-09-29 ENCOUNTER — Ambulatory Visit: Payer: Managed Care, Other (non HMO) | Admitting: Internal Medicine

## 2018-09-29 ENCOUNTER — Other Ambulatory Visit: Payer: Self-pay

## 2018-09-29 VITALS — BP 112/78 | HR 99 | Ht 65.0 in | Wt 263.0 lb

## 2018-09-29 DIAGNOSIS — I10 Essential (primary) hypertension: Secondary | ICD-10-CM | POA: Diagnosis not present

## 2018-09-29 DIAGNOSIS — E118 Type 2 diabetes mellitus with unspecified complications: Secondary | ICD-10-CM | POA: Diagnosis not present

## 2018-09-29 DIAGNOSIS — E1169 Type 2 diabetes mellitus with other specified complication: Secondary | ICD-10-CM | POA: Diagnosis not present

## 2018-09-29 DIAGNOSIS — E785 Hyperlipidemia, unspecified: Secondary | ICD-10-CM

## 2018-09-29 DIAGNOSIS — N8501 Benign endometrial hyperplasia: Secondary | ICD-10-CM

## 2018-09-29 MED ORDER — METFORMIN HCL 500 MG PO TABS: 500.0000 mg | ORAL_TABLET | Freq: Two times a day (BID) | ORAL | 3 refills | Status: DC

## 2018-09-29 MED ORDER — PRAVASTATIN SODIUM 20 MG PO TABS: 20.0000 mg | ORAL_TABLET | Freq: Every day | ORAL | 3 refills | Status: DC

## 2018-09-29 NOTE — Patient Instructions (Signed)
Take Rybelsus 3 mg once a day. When you get near the end of the 4 weeks, call me for a prescription for 7 mg per day (and be sure to use the copay to make it affordable).

## 2018-09-29 NOTE — Progress Notes (Signed)
Date:  09/29/2018   Name:  Anita Krueger   DOB:  Dec 13, 1960   MRN:  [id number]   Chief Complaint: Diabetes (4 month follow up. Offered flu shot- declined today.) and Hypertension  Diabetes She presents for her follow-up diabetic visit. She has type 2 diabetes mellitus. Her disease course has been worsening. Pertinent negatives for hypoglycemia include no dizziness, headaches or nervousness/anxiousness. Pertinent negatives for diabetes include no chest pain, no fatigue, no foot paresthesias, no polydipsia, no polyuria and no weakness. Pertinent negatives for diabetic complications include no CVA. Current diabetic treatment includes oral agent (monotherapy) (metformin). She is compliant with treatment all of the time. Her weight is stable. She monitors blood glucose at home 1-2 x per day. Her breakfast blood glucose is taken between 6-7 am. Her breakfast blood glucose range is generally >200 mg/dl. Her dinner blood glucose is taken between 6-7 pm. Her dinner blood glucose range is generally >200 mg/dl. An ACE inhibitor/angiotensin II receptor blocker is being taken.  Hypertension The problem is controlled (130/80 at home and other offices). Associated symptoms include peripheral edema and shortness of breath. Pertinent negatives include no chest pain, headaches, orthopnea or palpitations. Past treatments include angiotensin blockers and diuretics. There is no history of kidney disease, CAD/MI or CVA.  Hyperlipidemia The problem is controlled. Associated symptoms include shortness of breath. Pertinent negatives include no chest pain. Current antihyperlipidemic treatment includes statins. The current treatment provides significant improvement of lipids.  Abnormal uterine bleeding - she is s/p biopsy but continues to have vaginal spotting. She is scheduled for surgery in September.  Lab Results  Component Value Date   HGBA1C 7.0 (H) 05/26/2018   Lab Results  Component Value Date   CREATININE 0.57  01/20/2018   BUN 12 01/20/2018   NA 142 01/20/2018   K 4.1 01/20/2018   CL 96 01/20/2018   CO2 19 (L) 01/20/2018   Lab Results  Component Value Date   WBC 9.9 05/26/2018   HGB 14.4 05/26/2018   HCT 42.5 05/26/2018   MCV 85 05/26/2018   PLT 299 05/26/2018   Lab Results  Component Value Date   CHOL 147 01/20/2018   HDL 32 (L) 01/20/2018   LDLCALC 93 01/20/2018   TRIG 109 01/20/2018   CHOLHDL 4.6 (H) 01/20/2018     Review of Systems  Constitutional: Negative for chills, fatigue, fever and unexpected weight change.  HENT: Negative for trouble swallowing.   Eyes: Negative for visual disturbance.  Respiratory: Positive for shortness of breath. Negative for cough, chest tightness and wheezing.   Cardiovascular: Positive for leg swelling. Negative for chest pain, palpitations and orthopnea.  Gastrointestinal: Negative for constipation, diarrhea and nausea.  Endocrine: Negative for polydipsia and polyuria.  Genitourinary: Positive for vaginal bleeding.  Allergic/Immunologic: Negative for environmental allergies.  Neurological: Negative for dizziness, weakness, numbness and headaches.  Psychiatric/Behavioral: Negative for sleep disturbance. The patient is not nervous/anxious.     Patient Active Problem List   Diagnosis Date Noted  . Tubular adenoma of colon 04/03/2018  . Iron deficiency anemia 01/24/2018  . 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 Menorrhagia with irregular cycle 01/19/2017  . Hip pain, acute, left 05/12/2016  . Essential hypertension 04/04/2015  . DM (diabetes mellitus) with complications ([place]) 0000000  . Hyperlipidemia associated with type 2 diabetes mellitus ([place]) 09/29/2014    No Known Allergies  Past Surgical History:  Procedure Laterality Date  . bell's palsy  2016   right eye sluggish  .  COLONOSCOPY WITH PROPOFOL N/A 03/31/2018   Procedure: COLONOSCOPY WITH PROPOFOL;  Surgeon: Jonathon Bellows, MD;  Location: [facility] ENDOSCOPY;   Service: Gastroenterology;  Laterality: N/A;  . DILATATION & CURETTAGE/HYSTEROSCOPY WITH MYOSURE N/A 03/31/2017   Procedure: DILATATION & CURETTAGE/HYSTEROSCOPY WITH MYOSURE;  Surgeon: [name], MD;  Location: [facility] ORS;  Service: Gynecology;  Laterality: N/A;  . LAPAROSCOPY      Social History   Tobacco Use  . Smoking status: Former Smoker    Types: Cigarettes  . Smokeless tobacco: Never Used  . Tobacco comment: 40 years ago  Substance Use Topics  . Alcohol use: Yes    Alcohol/week: 2.0 standard drinks    Types: 2 Shots of liquor per week    Comment: occasional  . Drug use: No     Medication list has been reviewed and updated.  Current Meds  Medication Sig  . amLODipine (NORVASC) 5 MG tablet Take 1 tablet (5 mg total) by mouth daily.  . Carboxymethylcellul-Glycerin (CLEAR EYES FOR DRY EYES) 1-0.25 % SOLN Place 1 drop into both eyes 3 (three) times daily as needed (dry eyes).  . carboxymethylcellulose (REFRESH PLUS) 0.5 % SOLN Place 1 drop into both eyes 3 (three) times daily as needed (dry eyes).  Marland Kitchen loratadine (CLARITIN) 10 MG tablet Take 10 mg by mouth daily as needed for allergies.  . megestrol (MEGACE) 20 MG tablet Take 1 tablet by mouth daily at 2 PM.  . metFORMIN (GLUCOPHAGE) 500 MG tablet TAKE 1 TABLET BY MOUTH TWO  TIMES DAILY WITH MEALS  . Multiple Vitamins-Minerals (DAILY MULTIVITAMIN PO) Take 2 tablets by mouth daily.  Marland Kitchen olmesartan-hydrochlorothiazide (BENICAR HCT) 40-25 MG tablet TAKE 1 TABLET BY MOUTH DAILY  . ONETOUCH DELICA LANCETS FINE MISC USE 1 LANCET UP TO 4 TIMES  DAILY AS DIRECTED  . ONETOUCH ULTRA test strip USE 1 STRIP UP TO 4 TIMES  DAILY AS DIRECTED  . pravastatin (PRAVACHOL) 20 MG tablet TAKE 1 TABLET BY MOUTH  DAILY    PHQ 2/9 Scores 09/29/2018 05/26/2018 01/20/2018 02/07/2017  PHQ - 2 Score 1 0 0 0  PHQ- 9 Score 1 10 - -    BP Readings from Last 3 Encounters:  09/29/18 112/78  09/18/18 132/88  05/26/18 122/81    Physical Exam Vitals  signs and nursing note reviewed.  Constitutional:      General: She is not in acute distress.    Appearance: Normal appearance. She is well-developed. She is obese.  HENT:     Head: Normocephalic and atraumatic.  Neck:     Musculoskeletal: Normal range of motion and neck supple.  Cardiovascular:     Rate and Rhythm: Normal rate and regular rhythm.     Pulses: Normal pulses.     Heart sounds: No murmur.  Pulmonary:     Effort: Pulmonary effort is normal. No respiratory distress.     Breath sounds: No stridor. No wheezing or rhonchi.  Musculoskeletal: Normal range of motion.     Right lower leg: Edema present.     Left lower leg: Edema present.  Lymphadenopathy:     Cervical: No cervical adenopathy.  Skin:    General: Skin is warm and dry.     Capillary Refill: Capillary refill takes less than 2 seconds.     Findings: No rash.  Neurological:     Mental Status: She is alert and oriented to person, place, and time.  Psychiatric:        Attention and Perception: Attention normal.  Mood and Affect: Mood normal.        Behavior: Behavior normal.        Thought Content: Thought content normal.     Wt Readings from Last 3 Encounters:  09/29/18 263 lb (119.3 kg)  09/18/18 265 lb (120.2 kg)  05/26/18 272 lb (123.4 kg)    BP 112/78   Pulse 99   Ht 5\' 5"  (1.651 m)   Wt 263 lb (119.3 kg)   SpO2 96%   BMI 43.77 kg/m   Assessment and Plan: 1. DM (diabetes mellitus) with complications ([place]) Clinically stable by exam and report without s/s of hypoglycemia. DM complicated by HTN, hyperlipidemia, obesity. Currently not controlled for unclear reasons. Tolerating medications metformin 500 mg bid well without side effects. Will add Rybelsus 3 mg daily and increase to 7 mg after one month.  Pt given samples and savings card.  - Hemoglobin A1c - Comprehensive metabolic panel - metFORMIN (GLUCOPHAGE) 500 MG tablet; Take 1 tablet (500 mg total) by mouth 2 (two) times daily with a  meal.  Dispense: 180 tablet; Refill: 3  2. Hyperlipidemia associated with type 2 diabetes mellitus ([place]) Tolerating statin medication without side effects at this time LDL is not quite at goal of < 70 on current dose Continue same therapy without change at this time.  - pravastatin (PRAVACHOL) 20 MG tablet; Take 1 tablet (20 mg total) by mouth daily.  Dispense: 90 tablet; Refill: 3  3. Essential hypertension Clinically stable exam with well controlled BP.   Tolerating medications, amlodipine, benicar hct, without side effects at this time. Pt to continue current regimen and low sodium diet; benefits of regular exercise as able discussed.  4. Complex endometrial hyperplasia without atypia Planning surgery next month Would like BS to be under better control   Partially dictated using Editor, commissioning. Any errors are unintentional.  Halina Maidens, MD [facility]  09/29/2018

## 2018-09-30 LAB — COMPREHENSIVE METABOLIC PANEL
ALT: 44 IU/L — ABNORMAL HIGH (ref 0–32)
AST: 35 IU/L (ref 0–40)
Albumin/Globulin Ratio: 2 (ref 1.2–2.2)
Albumin: 4.5 g/dL (ref 3.8–4.9)
Alkaline Phosphatase: 67 IU/L (ref 39–117)
BUN/Creatinine Ratio: 27 — ABNORMAL HIGH (ref 9–23)
BUN: 27 mg/dL — ABNORMAL HIGH (ref 6–24)
Bilirubin Total: 0.2 mg/dL (ref 0.0–1.2)
CO2: 17 mmol/L — ABNORMAL LOW (ref 20–29)
Calcium: 10.2 mg/dL (ref 8.7–10.2)
Chloride: 98 mmol/L (ref 96–106)
Creatinine, Ser: 1 mg/dL (ref 0.57–1.00)
GFR calc Af Amer: 73 mL/min/{1.73_m2} (ref >59)
GFR calc non Af Amer: 63 mL/min/{1.73_m2} (ref >59)
Globulin, Total: 2.3 g/dL (ref 1.5–4.5)
Glucose: 273 mg/dL — ABNORMAL HIGH (ref 65–99)
Potassium: 4.6 mmol/L (ref 3.5–5.2)
Sodium: 140 mmol/L (ref 134–144)
Total Protein: 6.8 g/dL (ref 6.0–8.5)

## 2018-09-30 LAB — HEMOGLOBIN A1C
Est. average glucose Bld gHb Est-mCnc: 252 mg/dL
Hgb A1c MFr Bld: 10.4 % — ABNORMAL HIGH (ref 4.8–5.6)

## 2018-10-04 ENCOUNTER — Other Ambulatory Visit: Payer: Self-pay | Admitting: Obstetrics and Gynecology

## 2018-10-04 NOTE — Telephone Encounter (Signed)
advise

## 2018-10-25 ENCOUNTER — Telehealth: Payer: Self-pay

## 2018-10-25 NOTE — Telephone Encounter (Signed)
Patient states she would like to cancel her surgery. She is starting to do better with the pills. Apparently, she needed to give them more time. [id number].

## 2018-10-26 NOTE — Telephone Encounter (Signed)
Noted. She still needs a follow up visit with me six months after her last visit

## 2018-10-26 NOTE — Telephone Encounter (Signed)
Confirmed cancellation of surgery with patient. Per patient, she is feeling better, and will call back if needed.

## 2018-10-27 NOTE — Telephone Encounter (Signed)
Patient is schedule 03/23/19 with [facility]

## 2018-10-30 ENCOUNTER — Other Ambulatory Visit: Payer: Self-pay | Admitting: Internal Medicine

## 2018-10-30 ENCOUNTER — Inpatient Hospital Stay: Admission: RE | Admit: 2018-10-30 | Payer: Managed Care, Other (non HMO) | Source: Ambulatory Visit

## 2018-10-30 ENCOUNTER — Telehealth: Payer: Self-pay

## 2018-10-30 DIAGNOSIS — E118 Type 2 diabetes mellitus with unspecified complications: Secondary | ICD-10-CM

## 2018-10-30 MED ORDER — RYBELSUS 7 MG PO TABS: 1.0000 | ORAL_TABLET | Freq: Every day | ORAL | 0 refills | Status: DC

## 2018-10-30 NOTE — Telephone Encounter (Signed)
rybelsus samples working ready for increase to 7 mg and send this RX to [facility].

## 2018-10-31 LAB — HM DIABETES EYE EXAM

## 2018-11-02 ENCOUNTER — Ambulatory Visit: Admit: 2018-11-02 | Payer: Managed Care, Other (non HMO) | Admitting: Obstetrics and Gynecology

## 2018-11-02 SURGERY — DILATATION AND CURETTAGE /HYSTEROSCOPY
Anesthesia: Choice

## 2018-11-10 ENCOUNTER — Encounter: Payer: Self-pay | Admitting: Internal Medicine

## 2018-11-21 ENCOUNTER — Other Ambulatory Visit: Payer: Self-pay | Admitting: Internal Medicine

## 2018-11-21 DIAGNOSIS — Z1231 Encounter for screening mammogram for malignant neoplasm of breast: Secondary | ICD-10-CM

## 2018-11-27 ENCOUNTER — Other Ambulatory Visit: Payer: Self-pay | Admitting: Internal Medicine

## 2018-11-27 DIAGNOSIS — E118 Type 2 diabetes mellitus with unspecified complications: Secondary | ICD-10-CM

## 2018-12-24 ENCOUNTER — Other Ambulatory Visit: Payer: Self-pay | Admitting: Internal Medicine

## 2018-12-29 ENCOUNTER — Other Ambulatory Visit: Payer: Self-pay

## 2018-12-29 ENCOUNTER — Encounter: Payer: Self-pay | Admitting: Internal Medicine

## 2018-12-29 ENCOUNTER — Ambulatory Visit: Payer: Managed Care, Other (non HMO) | Admitting: Internal Medicine

## 2018-12-29 VITALS — BP 114/70 | HR 97 | Ht 65.0 in | Wt 263.0 lb

## 2018-12-29 DIAGNOSIS — E118 Type 2 diabetes mellitus with unspecified complications: Secondary | ICD-10-CM | POA: Diagnosis not present

## 2018-12-29 DIAGNOSIS — Z23 Encounter for immunization: Secondary | ICD-10-CM | POA: Diagnosis not present

## 2018-12-29 DIAGNOSIS — I10 Essential (primary) hypertension: Secondary | ICD-10-CM | POA: Diagnosis not present

## 2018-12-29 MED ORDER — AMLODIPINE BESYLATE 5 MG PO TABS: 5.0000 mg | ORAL_TABLET | Freq: Every day | ORAL | 3 refills | Status: DC

## 2018-12-29 NOTE — Progress Notes (Signed)
Date:  12/29/2018   Name:  Anita Krueger   DOB:  [birth year]   MRN:  [id number]   Chief Complaint: Diabetes (Flu shot. PHQ9- 8)  Diabetes She presents for her follow-up diabetic visit. She has type 2 diabetes mellitus. Her disease course has been stable. Pertinent negatives for hypoglycemia include no headaches or tremors. Pertinent negatives for diabetes include no fatigue, no polydipsia and no polyuria. Symptoms are stable. Current diabetic treatment includes oral agent (dual therapy) (metformin and rybelsus). Her weight is stable. She monitors blood glucose at home 1-2 x per day. Her home blood glucose trend is decreasing steadily. Her breakfast blood glucose is taken between 7-8 am. Her breakfast blood glucose range is generally 110-130 mg/dl. An ACE inhibitor/angiotensin II receptor blocker is being taken.  Hypertension This is a chronic problem. The problem is controlled. Pertinent negatives include no headaches or palpitations. Past treatments include angiotensin blockers, diuretics and calcium channel blockers.    Lab Results  Component Value Date   CREATININE 1.00 09/29/2018   BUN 27 (H) 09/29/2018   NA 140 09/29/2018   K 4.6 09/29/2018   CL 98 09/29/2018   CO2 17 (L) 09/29/2018   Lab Results  Component Value Date   CHOL 147 01/20/2018   HDL 32 (L) 01/20/2018   LDLCALC 93 01/20/2018   TRIG 109 01/20/2018   CHOLHDL 4.6 (H) 01/20/2018   Lab Results  Component Value Date   TSH 1.300 01/20/2018   Lab Results  Component Value Date   HGBA1C 10.4 (H) 09/29/2018     Review of Systems  Constitutional: Negative for appetite change, fatigue, fever and unexpected weight change.  HENT: Negative for tinnitus and trouble swallowing.   Eyes: Negative for visual disturbance.  Respiratory: Negative for cough and chest tightness.   Cardiovascular: Negative for palpitations and leg swelling.  Gastrointestinal: Negative for abdominal pain.  Endocrine: Negative for polydipsia and  polyuria.  Genitourinary: Negative for dysuria and hematuria.  Musculoskeletal: Negative for arthralgias.  Neurological: Negative for tremors, numbness and headaches.  Psychiatric/Behavioral: Negative for dysphoric mood.    Patient Active Problem List   Diagnosis Date Noted  . Tubular adenoma of colon 04/03/2018  . Iron deficiency anemia 01/24/2018  . 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 Menorrhagia with irregular cycle 01/19/2017  . Hip pain, acute, left 05/12/2016  . Essential hypertension 04/04/2015  . DM (diabetes mellitus) with complications ([place]) 0000000  . Hyperlipidemia associated with type 2 diabetes mellitus ([place]) 09/29/2014    No Known Allergies  Past Surgical History:  Procedure Laterality Date  . bell's palsy  2016   right eye sluggish  . COLONOSCOPY WITH PROPOFOL N/A 03/31/2018   Procedure: COLONOSCOPY WITH PROPOFOL;  Surgeon: Jonathon Bellows, MD;  Location: [facility] ENDOSCOPY;  Service: Gastroenterology;  Laterality: N/A;  . DILATATION & CURETTAGE/HYSTEROSCOPY WITH MYOSURE N/A 03/31/2017   Procedure: DILATATION & CURETTAGE/HYSTEROSCOPY WITH MYOSURE;  Surgeon: [name], MD;  Location: [facility] ORS;  Service: Gynecology;  Laterality: N/A;  . LAPAROSCOPY      Social History   Tobacco Use  . Smoking status: Former Smoker    Types: Cigarettes  . Smokeless tobacco: Never Used  . Tobacco comment: 40 years ago  Substance Use Topics  . Alcohol use: Yes    Alcohol/week: 2.0 standard drinks    Types: 2 Shots of liquor per week    Comment: occasional  . Drug use: No     Medication list has been reviewed and  updated.  Current Meds  Medication Sig  . amLODipine (NORVASC) 5 MG tablet Take 1 tablet (5 mg total) by mouth daily.  . Carboxymethylcellul-Glycerin (CLEAR EYES FOR DRY EYES) 1-0.25 % SOLN Place 1 drop into both eyes 3 (three) times daily as needed (dry eyes).  . carboxymethylcellulose (REFRESH PLUS) 0.5 % SOLN Place 1 drop into both  eyes 3 (three) times daily as needed (dry eyes).  Marland Kitchen loratadine (CLARITIN) 10 MG tablet Take 10 mg by mouth daily as needed for allergies.  . megestrol (MEGACE) 20 MG tablet TAKE 1 TABLET BY MOUTH  DAILY  . metFORMIN (GLUCOPHAGE) 500 MG tablet Take 1 tablet (500 mg total) by mouth 2 (two) times daily with a meal.  . Multiple Vitamins-Minerals (DAILY MULTIVITAMIN PO) Take 2 tablets by mouth daily.  Marland Kitchen olmesartan-hydrochlorothiazide (BENICAR HCT) 40-25 MG tablet TAKE 1 TABLET BY MOUTH DAILY  . ONETOUCH DELICA LANCETS FINE MISC USE 1 LANCET UP TO 4 TIMES  DAILY AS DIRECTED  . ONETOUCH ULTRA test strip USE 1 STRIP UP TO 4 TIMES  DAILY AS DIRECTED  . pravastatin (PRAVACHOL) 20 MG tablet Take 1 tablet (20 mg total) by mouth daily.  . RYBELSUS 7 MG TABS TAKE 1 TABLET BY MOUTH DAILY    PHQ 2/9 Scores 12/29/2018 09/29/2018 05/26/2018 01/20/2018  PHQ - 2 Score 1 1 0 0  PHQ- 9 Score 8 1 10  -    BP Readings from Last 3 Encounters:  12/29/18 114/70  09/29/18 112/78  09/18/18 132/88    Physical Exam Vitals signs and nursing note reviewed.  Constitutional:      General: She is not in acute distress.    Appearance: She is well-developed.  HENT:     Head: Normocephalic and atraumatic.  Neck:     Musculoskeletal: Normal range of motion.     Vascular: No carotid bruit.  Cardiovascular:     Rate and Rhythm: Normal rate and regular rhythm.     Pulses: Normal pulses.     Heart sounds: No murmur.  Pulmonary:     Effort: Pulmonary effort is normal. No respiratory distress.  Musculoskeletal: Normal range of motion.     Right lower leg: No edema.     Left lower leg: No edema.  Skin:    General: Skin is warm and dry.     Capillary Refill: Capillary refill takes less than 2 seconds.     Findings: No rash.  Neurological:     General: No focal deficit present.     Mental Status: She is alert and oriented to person, place, and time.  Psychiatric:        Behavior: Behavior normal.        Thought  Content: Thought content normal.     Wt Readings from Last 3 Encounters:  12/29/18 263 lb (119.3 kg)  09/29/18 263 lb (119.3 kg)  09/18/18 265 lb (120.2 kg)    BP 114/70   Pulse 97   Ht 5\' 5"  (1.651 m)   Wt 263 lb (119.3 kg)   SpO2 96%   BMI 43.77 kg/m   Assessment and Plan: 1. DM (diabetes mellitus) with complications ([place]) Clinically stable by exam and report without s/s of hypoglycemia.  BS are much improved with Rybelsus. DM complicated by htn, lipids. Tolerating medications metformin and rybelsus 7 mg well without side effects or other concerns. Will consider increasing Ryblesus to 14 mg if needed - Hemoglobin 123456 - Basic metabolic panel  2. Essential hypertension Clinically stable  exam with well controlled BP.   Tolerating medications, amlodipine, olmesartan hct, without side effects at this time. Pt to continue current regimen and low sodium diet; benefits of regular exercise as able discussed. - amLODipine (NORVASC) 5 MG tablet; Take 1 tablet (5 mg total) by mouth daily.  Dispense: 90 tablet; Refill: 3   Partially dictated using Editor, commissioning. Any errors are unintentional.  Halina Maidens, MD [facility]  12/29/2018

## 2018-12-30 LAB — BASIC METABOLIC PANEL
BUN/Creatinine Ratio: 22 (ref 9–23)
BUN: 21 mg/dL (ref 6–24)
CO2: 16 mmol/L — ABNORMAL LOW (ref 20–29)
Calcium: 10.2 mg/dL (ref 8.7–10.2)
Chloride: 103 mmol/L (ref 96–106)
Creatinine, Ser: 0.95 mg/dL (ref 0.57–1.00)
GFR calc Af Amer: 77 mL/min/{1.73_m2} (ref >59)
GFR calc non Af Amer: 67 mL/min/{1.73_m2} (ref >59)
Glucose: 117 mg/dL — ABNORMAL HIGH (ref 65–99)
Potassium: 4.6 mmol/L (ref 3.5–5.2)
Sodium: 142 mmol/L (ref 134–144)

## 2018-12-30 LAB — HEMOGLOBIN A1C
Est. average glucose Bld gHb Est-mCnc: 192 mg/dL
Hgb A1c MFr Bld: 8.3 % — ABNORMAL HIGH (ref 4.8–5.6)

## 2019-01-13 ENCOUNTER — Other Ambulatory Visit: Payer: Self-pay | Admitting: Obstetrics and Gynecology

## 2019-01-15 NOTE — Telephone Encounter (Signed)
advise

## 2019-01-26 ENCOUNTER — Encounter: Payer: Self-pay | Admitting: Internal Medicine

## 2019-01-26 ENCOUNTER — Other Ambulatory Visit: Payer: Self-pay | Admitting: Internal Medicine

## 2019-01-26 ENCOUNTER — Other Ambulatory Visit: Payer: Self-pay

## 2019-01-26 ENCOUNTER — Ambulatory Visit (INDEPENDENT_AMBULATORY_CARE_PROVIDER_SITE_OTHER): Payer: Managed Care, Other (non HMO) | Admitting: Internal Medicine

## 2019-01-26 VITALS — BP 118/82 | HR 94 | Ht 65.0 in | Wt 263.0 lb

## 2019-01-26 DIAGNOSIS — D508 Other iron deficiency anemias: Secondary | ICD-10-CM

## 2019-01-26 DIAGNOSIS — Z1231 Encounter for screening mammogram for malignant neoplasm of breast: Secondary | ICD-10-CM

## 2019-01-26 DIAGNOSIS — E118 Type 2 diabetes mellitus with unspecified complications: Secondary | ICD-10-CM | POA: Diagnosis not present

## 2019-01-26 DIAGNOSIS — R8281 Pyuria: Secondary | ICD-10-CM | POA: Diagnosis not present

## 2019-01-26 DIAGNOSIS — E1169 Type 2 diabetes mellitus with other specified complication: Secondary | ICD-10-CM

## 2019-01-26 DIAGNOSIS — Z0000 Encounter for general adult medical examination without abnormal findings: Secondary | ICD-10-CM | POA: Diagnosis not present

## 2019-01-26 DIAGNOSIS — I10 Essential (primary) hypertension: Secondary | ICD-10-CM

## 2019-01-26 DIAGNOSIS — E785 Hyperlipidemia, unspecified: Secondary | ICD-10-CM

## 2019-01-26 LAB — POCT URINALYSIS DIPSTICK
Bilirubin, UA: NEGATIVE
Glucose, UA: NEGATIVE
Ketones, UA: 15
Nitrite, UA: NEGATIVE
Protein, UA: NEGATIVE
Spec Grav, UA: 1.01 (ref 1.010–1.025)
Urobilinogen, UA: 0.2 E.U./dL
pH, UA: 5 (ref 5.0–8.0)

## 2019-01-26 MED ORDER — SEMAGLUTIDE 14 MG PO TABS: 14.0000 mg | ORAL_TABLET | Freq: Every day | ORAL | 12 refills | Status: DC

## 2019-01-26 NOTE — Progress Notes (Signed)
Date:  01/26/2019   Name:  Anita Krueger   DOB:  [birth year]   MRN:  [id number]   Chief Complaint: Annual Exam (Breast Exam.) Anita Krueger is a 58 y.o. female who presents today for her Complete Annual Exam. She feels well. She reports exercising walking several times per week. She reports she is sleeping fairly well. She denies breast issues.  Mammogram  12/2017 - scheduled in March Colonoscopy  03/2018 Immunization History  Administered Date(s) Administered  . Influenza,inj,Quad PF,6+ Mos 01/14/2017, 12/15/2017, 12/29/2018  . Influenza-Unspecified 12/10/2014, 12/09/2017  . Pneumococcal Polysaccharide-23 09/23/2017    Diabetes She presents for her follow-up diabetic visit. She has type 2 diabetes mellitus. Her disease course has been improving. Pertinent negatives for hypoglycemia include no dizziness, headaches, nervousness/anxiousness or tremors. Pertinent negatives for diabetes include no chest pain, no fatigue, no polydipsia and no polyuria. (Mild genital itching - intermittent) Current diabetic treatment includes oral agent (dual therapy) (metformin and rybelsus added 3 mo ago). She is compliant with treatment all of the time. Her weight is stable. She monitors blood glucose at home 1-2 x per day. Her breakfast blood glucose is taken between 6-7 am. Her breakfast blood glucose range is generally 140-180 mg/dl. Her lunch blood glucose is taken between 12-1 pm. Her lunch blood glucose range is generally 130-140 mg/dl. An ACE inhibitor/angiotensin II receptor blocker is being taken.  Hypertension This is a chronic problem. The problem is controlled. Pertinent negatives include no chest pain, headaches, palpitations or shortness of breath. Past treatments include calcium channel blockers, angiotensin blockers and diuretics. The current treatment provides significant improvement.  Hyperlipidemia The problem is controlled. Pertinent negatives include no chest pain or shortness of breath.  Current antihyperlipidemic treatment includes statins. The current treatment provides significant improvement of lipids.    Lab Results  Component Value Date   CREATININE 0.95 12/29/2018   BUN 21 12/29/2018   NA 142 12/29/2018   K 4.6 12/29/2018   CL 103 12/29/2018   CO2 16 (L) 12/29/2018   Lab Results  Component Value Date   CHOL 147 01/20/2018   HDL 32 (L) 01/20/2018   LDLCALC 93 01/20/2018   TRIG 109 01/20/2018   CHOLHDL 4.6 (H) 01/20/2018   Lab Results  Component Value Date   TSH 1.300 01/20/2018   Lab Results  Component Value Date   HGBA1C 8.3 (H) 12/29/2018     Review of Systems  Constitutional: Negative for chills, fatigue and fever.  HENT: Negative for congestion, hearing loss, tinnitus, trouble swallowing and voice change.   Eyes: Negative for visual disturbance.  Respiratory: Negative for cough, chest tightness, shortness of breath and wheezing.   Cardiovascular: Negative for chest pain, palpitations and leg swelling.  Gastrointestinal: Negative for abdominal pain, constipation, diarrhea and vomiting.  Endocrine: Negative for polydipsia and polyuria.  Genitourinary: Negative for dysuria, frequency, genital sores, vaginal bleeding and vaginal discharge.  Musculoskeletal: Negative for arthralgias, gait problem and joint swelling.  Skin: Negative for color change and rash.  Neurological: Negative for dizziness, tremors, light-headedness and headaches.  Hematological: Negative for adenopathy. Does not bruise/bleed easily.  Psychiatric/Behavioral: Negative for dysphoric mood and sleep disturbance. The patient is not nervous/anxious.     Patient Active Problem List   Diagnosis Date Noted  . Tubular adenoma of colon 04/03/2018  . Iron deficiency anemia 01/24/2018  . 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 Menorrhagia with irregular cycle 01/19/2017  . Hip pain, acute, left 05/12/2016  . Essential  hypertension 04/04/2015  . DM (diabetes  mellitus) with complications ([place]) 0000000  . Hyperlipidemia associated with type 2 diabetes mellitus ([place]) 09/29/2014    No Known Allergies  Past Surgical History:  Procedure Laterality Date  . bell's palsy  2016   right eye sluggish  . COLONOSCOPY WITH PROPOFOL N/A 03/31/2018   Procedure: COLONOSCOPY WITH PROPOFOL;  Surgeon: Jonathon Bellows, MD;  Location: [facility] ENDOSCOPY;  Service: Gastroenterology;  Laterality: N/A;  . DILATATION & CURETTAGE/HYSTEROSCOPY WITH MYOSURE N/A 03/31/2017   Procedure: DILATATION & CURETTAGE/HYSTEROSCOPY WITH MYOSURE;  Surgeon: [name], MD;  Location: [facility] ORS;  Service: Gynecology;  Laterality: N/A;  . LAPAROSCOPY      Social History   Tobacco Use  . Smoking status: Former Smoker    Types: Cigarettes  . Smokeless tobacco: Never Used  . Tobacco comment: 40 years ago  Substance Use Topics  . Alcohol use: Yes    Alcohol/week: 2.0 standard drinks    Types: 2 Shots of liquor per week    Comment: occasional  . Drug use: No     Medication list has been reviewed and updated.  Current Meds  Medication Sig  . amLODipine (NORVASC) 5 MG tablet Take 1 tablet (5 mg total) by mouth daily.  . Carboxymethylcellul-Glycerin (CLEAR EYES FOR DRY EYES) 1-0.25 % SOLN Place 1 drop into both eyes 3 (three) times daily as needed (dry eyes).  . carboxymethylcellulose (REFRESH PLUS) 0.5 % SOLN Place 1 drop into both eyes 3 (three) times daily as needed (dry eyes).  Marland Kitchen loratadine (CLARITIN) 10 MG tablet Take 10 mg by mouth daily as needed for allergies.  . megestrol (MEGACE) 20 MG tablet TAKE 1 TABLET BY MOUTH  DAILY  . metFORMIN (GLUCOPHAGE) 500 MG tablet Take 1 tablet (500 mg total) by mouth 2 (two) times daily with a meal.  . Multiple Vitamins-Minerals (DAILY MULTIVITAMIN PO) Take 2 tablets by mouth daily.  Marland Kitchen olmesartan-hydrochlorothiazide (BENICAR HCT) 40-25 MG tablet TAKE 1 TABLET BY MOUTH DAILY  . ONETOUCH DELICA LANCETS FINE MISC USE 1 LANCET UP TO 4 TIMES   DAILY AS DIRECTED  . ONETOUCH ULTRA test strip USE 1 STRIP UP TO 4 TIMES  DAILY AS DIRECTED  . pravastatin (PRAVACHOL) 20 MG tablet Take 1 tablet (20 mg total) by mouth daily.  . RYBELSUS 7 MG TABS TAKE 1 TABLET BY MOUTH DAILY (Patient taking differently: Take 2 tablets by mouth daily. 14 mg)    PHQ 2/9 Scores 01/26/2019 12/29/2018 09/29/2018 05/26/2018  PHQ - 2 Score 2 1 1  0  PHQ- 9 Score 8 8 1 10     BP Readings from Last 3 Encounters:  01/26/19 118/82  12/29/18 114/70  09/29/18 112/78    Physical Exam Vitals and nursing note reviewed.  Constitutional:      General: She is not in acute distress.    Appearance: She is well-developed.  HENT:     Head: Normocephalic and atraumatic.     Right Ear: Tympanic membrane and ear canal normal.     Left Ear: Tympanic membrane and ear canal normal.     Nose:     Right Sinus: No maxillary sinus tenderness.     Left Sinus: No maxillary sinus tenderness.  Eyes:     General: No scleral icterus.       Right eye: No discharge.        Left eye: No discharge.     Conjunctiva/sclera: Conjunctivae normal.  Neck:     Thyroid: No thyromegaly.  Vascular: No carotid bruit.  Cardiovascular:     Rate and Rhythm: Normal rate and regular rhythm.     Pulses: Normal pulses.     Heart sounds: Normal heart sounds.  Pulmonary:     Effort: Pulmonary effort is normal. No respiratory distress.     Breath sounds: No wheezing.  Chest:     Breasts:        Right: No mass, nipple discharge, skin change or tenderness.        Left: No mass, nipple discharge, skin change or tenderness.  Abdominal:     General: Bowel sounds are normal.     Palpations: Abdomen is soft. There is no mass.     Tenderness: There is no abdominal tenderness. There is no rebound.  Musculoskeletal:        General: Normal range of motion.     Cervical back: Normal range of motion. No erythema.     Right lower leg: No edema.     Left lower leg: No edema.  Lymphadenopathy:      Cervical: No cervical adenopathy.  Skin:    General: Skin is warm and dry.     Capillary Refill: Capillary refill takes less than 2 seconds.     Findings: No rash.  Neurological:     Mental Status: She is alert and oriented to person, place, and time.     Cranial Nerves: No cranial nerve deficit.     Sensory: No sensory deficit.     Deep Tendon Reflexes: Reflexes are normal and symmetric.  Psychiatric:        Speech: Speech normal.        Behavior: Behavior normal.        Thought Content: Thought content normal.     Wt Readings from Last 3 Encounters:  01/26/19 263 lb (119.3 kg)  12/29/18 263 lb (119.3 kg)  09/29/18 263 lb (119.3 kg)    BP 118/82   Pulse 94   Ht 5\' 5"  (1.651 m)   Wt 263 lb (119.3 kg)   SpO2 98%   BMI 43.77 kg/m   Assessment and Plan: 1. Annual physical exam Normal exam except for weight Continue to work on diet; more regular exercise as well - POCT urinalysis dipstick - positive Pt admits to occasional vaginal or genital itching but no dysuria Will send for culture  2. Encounter for screening mammogram for breast cancer Scheduled in March  3. Essential hypertension Clinically stable exam with well controlled BP.   Tolerating medications, benicar hct and amlodipine, without side effects at this time. Pt to continue current regimen and low sodium diet; benefits of regular exercise as able discussed. - Comprehensive metabolic panel  4. DM (diabetes mellitus) with complications ([place]) Clinically stable by exam and report without s/s of hypoglycemia. DM complicated by HTN, lipids. Tolerating medications rybelsus and metformin well without side effects or other concerns. She has not been able loose any weight however. - Hemoglobin A1c - TSH + free T4  5. Hyperlipidemia associated with type 2 diabetes mellitus ([place]) Tolerating statin medication without side effects at this time LDL is almost at goal of < 70 on current dose Continue same therapy  without change at this time. - Lipid panel  6. Other iron deficiency anemia Check labs - continue follow up with GYN at planned - CBC with Differential/Platelet   Partially dictated using Dragon software. Any errors are unintentional.  Halina Maidens, MD [facility]  01/26/2019

## 2019-01-27 LAB — CBC WITH DIFFERENTIAL/PLATELET
Basophils Absolute: 0.1 10*3/uL (ref 0.0–0.2)
Basos: 1 %
EOS (ABSOLUTE): 0.1 10*3/uL (ref 0.0–0.4)
Eos: 1 %
Hematocrit: 40.1 % (ref 34.0–46.6)
Hemoglobin: 13.3 g/dL (ref 11.1–15.9)
Immature Grans (Abs): 0.1 10*3/uL (ref 0.0–0.1)
Immature Granulocytes: 1 %
Lymphocytes Absolute: 2.6 10*3/uL (ref 0.7–3.1)
Lymphs: 25 %
MCH: 30.3 pg (ref 26.6–33.0)
MCHC: 33.2 g/dL (ref 31.5–35.7)
MCV: 91 fL (ref 79–97)
Monocytes Absolute: 0.7 10*3/uL (ref 0.1–0.9)
Monocytes: 7 %
Neutrophils Absolute: 6.8 10*3/uL (ref 1.4–7.0)
Neutrophils: 65 %
Platelets: 290 10*3/uL (ref 150–450)
RBC: 4.39 x10E6/uL (ref 3.77–5.28)
RDW: 13.8 % (ref 11.7–15.4)
WBC: 10.4 10*3/uL (ref 3.4–10.8)

## 2019-01-27 LAB — COMPREHENSIVE METABOLIC PANEL
ALT: 53 IU/L — ABNORMAL HIGH (ref 0–32)
AST: 49 IU/L — ABNORMAL HIGH (ref 0–40)
Albumin/Globulin Ratio: 1.8 (ref 1.2–2.2)
Albumin: 4.6 g/dL (ref 3.8–4.9)
Alkaline Phosphatase: 67 IU/L (ref 39–117)
BUN/Creatinine Ratio: 30 — ABNORMAL HIGH (ref 9–23)
BUN: 25 mg/dL — ABNORMAL HIGH (ref 6–24)
Bilirubin Total: 0.4 mg/dL (ref 0.0–1.2)
CO2: 24 mmol/L (ref 20–29)
Calcium: 10.4 mg/dL — ABNORMAL HIGH (ref 8.7–10.2)
Chloride: 98 mmol/L (ref 96–106)
Creatinine, Ser: 0.82 mg/dL (ref 0.57–1.00)
GFR calc Af Amer: 92 mL/min/{1.73_m2} (ref >59)
GFR calc non Af Amer: 80 mL/min/{1.73_m2} (ref >59)
Globulin, Total: 2.5 g/dL (ref 1.5–4.5)
Glucose: 131 mg/dL — ABNORMAL HIGH (ref 65–99)
Potassium: 4.6 mmol/L (ref 3.5–5.2)
Sodium: 138 mmol/L (ref 134–144)
Total Protein: 7.1 g/dL (ref 6.0–8.5)

## 2019-01-27 LAB — HEMOGLOBIN A1C
Est. average glucose Bld gHb Est-mCnc: 160 mg/dL
Hgb A1c MFr Bld: 7.2 % — ABNORMAL HIGH (ref 4.8–5.6)

## 2019-01-27 LAB — TSH+FREE T4
Free T4: 1.27 ng/dL (ref 0.82–1.77)
TSH: 1.19 u[IU]/mL (ref 0.450–4.500)

## 2019-01-27 LAB — LIPID PANEL
Chol/HDL Ratio: 4.4 ratio (ref 0.0–4.4)
Cholesterol, Total: 170 mg/dL (ref 100–199)
HDL: 39 mg/dL — ABNORMAL LOW (ref >39)
LDL Chol Calc (NIH): 109 mg/dL — ABNORMAL HIGH (ref 0–99)
Triglycerides: 120 mg/dL (ref 0–149)
VLDL Cholesterol Cal: 22 mg/dL (ref 5–40)

## 2019-01-30 LAB — URINE CULTURE

## 2019-03-17 ENCOUNTER — Other Ambulatory Visit: Payer: Self-pay | Admitting: Internal Medicine

## 2019-03-23 ENCOUNTER — Ambulatory Visit: Payer: Managed Care, Other (non HMO) | Admitting: Obstetrics and Gynecology

## 2019-04-02 ENCOUNTER — Encounter: Payer: Self-pay | Admitting: Obstetrics and Gynecology

## 2019-04-02 ENCOUNTER — Other Ambulatory Visit: Payer: Self-pay

## 2019-04-02 ENCOUNTER — Ambulatory Visit (INDEPENDENT_AMBULATORY_CARE_PROVIDER_SITE_OTHER): Payer: Managed Care, Other (non HMO) | Admitting: Obstetrics and Gynecology

## 2019-04-02 VITALS — BP 136/68 | HR 100 | Ht 65.0 in | Wt 269.0 lb

## 2019-04-02 DIAGNOSIS — N8501 Benign endometrial hyperplasia: Secondary | ICD-10-CM

## 2019-04-02 NOTE — Progress Notes (Signed)
Obstetrics & Gynecology Office Visit   Chief Complaint  Patient presents with  . Follow-up    Medication follow up  Complex endometrial hyperplasia without atypia  History of Present Illness: 59 y.o. G52P0020  Female who presents in follow up of the above. She was seen in 09/2018 and had a biopsy that was essentially normal. SHe had been bleeding on Megace. So, she was scheduled to undergo hysteroscopy, D&C to further assess.  She cancelled surgery saying that she was doing much better.  Since her appointment in August, she stopped bleeding. She had a couple of days of light spotting in January. Nothing since then. Denies side effects of the medication. Denies bloating, weight loss, new constipation.   Past Medical History:  Diagnosis Date  . Arthritis   . Bell's palsy 2016   right eye droops  . Diabetes mellitus without complication ([place])   . Hyperlipidemia   . Hypertension     Past Surgical History:  Procedure Laterality Date  . bell's palsy  2016   right eye sluggish  . COLONOSCOPY WITH PROPOFOL N/A 03/31/2018   Procedure: COLONOSCOPY WITH PROPOFOL;  Surgeon: Jonathon Bellows, MD;  Location: [facility] ENDOSCOPY;  Service: Gastroenterology;  Laterality: N/A;  . DILATATION & CURETTAGE/HYSTEROSCOPY WITH MYOSURE N/A 03/31/2017   Procedure: DILATATION & CURETTAGE/HYSTEROSCOPY WITH MYOSURE;  Surgeon: [name], MD;  Location: [facility] ORS;  Service: Gynecology;  Laterality: N/A;  . LAPAROSCOPY      Gynecologic History: No LMP recorded. (Menstrual status: Irregular Periods).  Obstetric History: G2P0020  Family History  Problem Relation Age of Onset  . Hypertension Mother   . Diabetes Mother   . Hypertension Father   . Breast cancer Cousin        PAT COUSIN    Social History   Socioeconomic History  . Marital status: Single    Spouse name: Not on file  . Number of children: Not on file  . Years of education: Not on file  . Highest education level: Not on file  Occupational  History  . Not on file  Tobacco Use  . Smoking status: Former Smoker    Types: Cigarettes  . Smokeless tobacco: Never Used  . Tobacco comment: 40 years ago  Substance and Sexual Activity  . Alcohol use: Yes    Alcohol/week: 2.0 standard drinks    Types: 2 Shots of liquor per week    Comment: occasional  . Drug use: No  . Sexual activity: Not Currently    Birth control/protection: None  Other Topics Concern  . Not on file  Social History Narrative  . Not on file   Social Determinants of Health   Financial Resource Strain:   . Difficulty of Paying Living Expenses: Not on file  Food Insecurity:   . Worried About Charity fundraiser in the Last Year: Not on file  . Ran Out of Food in the Last Year: Not on file  Transportation Needs:   . Lack of Transportation (Medical): Not on file  . Lack of Transportation (Non-Medical): Not on file  Physical Activity:   . Days of Exercise per Week: Not on file  . Minutes of Exercise per Session: Not on file  Stress:   . Feeling of Stress : Not on file  Social Connections:   . Frequency of Communication with Friends and Family: Not on file  . Frequency of Social Gatherings with Friends and Family: Not on file  . Attends Religious Services: Not on file  .  Active Member of Clubs or Organizations: Not on file  . Attends Archivist Meetings: Not on file  . Marital Status: Not on file  Intimate Partner Violence:   . Fear of Current or Ex-Partner: Not on file  . Emotionally Abused: Not on file  . Physically Abused: Not on file  . Sexually Abused: Not on file    No Known Allergies  Prior to Admission medications   Medication Sig Start Date End Date Taking? Authorizing Provider  amLODipine (NORVASC) 5 MG tablet Take 1 tablet (5 mg total) by mouth daily. 12/29/18  Yes Glean Hess, MD  Carboxymethylcellul-Glycerin (CLEAR EYES FOR DRY EYES) 1-0.25 % SOLN Place 1 drop into both eyes 3 (three) times daily as needed (dry eyes).    Yes [provider]  carboxymethylcellulose (REFRESH PLUS) 0.5 % SOLN Place 1 drop into both eyes 3 (three) times daily as needed (dry eyes).   Yes [provider]  loratadine (CLARITIN) 10 MG tablet Take 10 mg by mouth daily as needed for allergies.   Yes [provider]  megestrol (MEGACE) 20 MG tablet TAKE 1 TABLET BY MOUTH  DAILY 01/16/19  Yes [name], MD  metFORMIN (GLUCOPHAGE) 500 MG tablet Take 1 tablet (500 mg total) by mouth 2 (two) times daily with a meal. 09/29/18  Yes Glean Hess, MD  Multiple Vitamins-Minerals (DAILY MULTIVITAMIN PO) Take 2 tablets by mouth daily.   Yes [provider]  olmesartan-hydrochlorothiazide (BENICAR HCT) 40-25 MG tablet TAKE 1 TABLET BY MOUTH DAILY 12/24/18  Yes Glean Hess, MD  [facility] DELICA LANCETS FINE MISC USE 1 LANCET UP TO 4 TIMES  DAILY AS DIRECTED 03/11/17  Yes Glean Hess, MD  [facility] ULTRA test strip USE 1 STRIP UP TO 4 TIMES  DAILY AS DIRECTED 03/17/19  Yes Glean Hess, MD  pravastatin (PRAVACHOL) 20 MG tablet Take 1 tablet (20 mg total) by mouth daily. 09/29/18  Yes Glean Hess, MD  Semaglutide 14 MG TABS Take 14 mg by mouth daily. 01/26/19  Yes Glean Hess, MD    Review of Systems  Constitutional: Negative.   HENT: Negative.   Eyes: Negative.   Respiratory: Negative.   Cardiovascular: Negative.   Gastrointestinal: Negative.   Genitourinary: Negative.   Musculoskeletal: Negative.   Skin: Negative.   Neurological: Negative.   Psychiatric/Behavioral: Negative.      Physical Exam BP 136/68   Pulse 100   Ht 5\' 5"  (1.651 m)   Wt 269 lb (122 kg)   BMI 44.76 kg/m  No LMP recorded. (Menstrual status: Irregular Periods). Physical Exam Constitutional:      General: She is not in acute distress.    Appearance: Normal appearance.  HENT:     Head: Normocephalic and atraumatic.  Eyes:     General: No scleral icterus.    Conjunctiva/sclera: Conjunctivae normal.   Neurological:     General: No focal deficit present.     Mental Status: She is alert and oriented to person, place, and time.     Cranial Nerves: No cranial nerve deficit.  Psychiatric:        Mood and Affect: Mood normal.        Behavior: Behavior normal.        Judgment: Judgment normal.     Female chaperone present for pelvic and breast  portions of the physical exam  Assessment: 59 y.o. G55P0020 female here for  1. Complex endometrial hyperplasia without atypia  Plan: Problem List Items Addressed This Visit      Genitourinary   Complex endometrial hyperplasia without atypia - Primary   Relevant Orders   US PELVIS TRANSVAGINAL NON-OB (TV ONLY)     Will obtain pelvic u/s to assess endometrial stripe. If < 4 mm, will plan to discontinue medication within the next 6 months. Otherwise, management based on u/s findings.   15 minutes spent in face to face discussion with > 50% spent in counseling,management, and coordination of care of her complex endometrial hyperplasia without atypia.   Prentice Docker, MD 04/02/2019 3:39 PM

## 2019-04-12 ENCOUNTER — Other Ambulatory Visit: Payer: Self-pay | Admitting: Obstetrics and Gynecology

## 2019-04-12 NOTE — Telephone Encounter (Signed)
Advise

## 2019-04-20 ENCOUNTER — Ambulatory Visit: Payer: Managed Care, Other (non HMO) | Admitting: Obstetrics and Gynecology

## 2019-04-20 ENCOUNTER — Other Ambulatory Visit: Payer: Managed Care, Other (non HMO)

## 2019-04-23 ENCOUNTER — Ambulatory Visit
Admission: RE | Admit: 2019-04-23 | Discharge: 2019-04-23 | Disposition: A | Discharge status: Home / Self Care | Payer: Managed Care, Other (non HMO) | Source: Ambulatory Visit | Attending: Internal Medicine | Admitting: Internal Medicine

## 2019-04-23 DIAGNOSIS — Z1231 Encounter for screening mammogram for malignant neoplasm of breast: Secondary | ICD-10-CM

## 2019-05-04 ENCOUNTER — Other Ambulatory Visit: Payer: Self-pay | Admitting: Obstetrics and Gynecology

## 2019-05-04 ENCOUNTER — Encounter: Payer: Self-pay | Admitting: Obstetrics and Gynecology

## 2019-05-04 ENCOUNTER — Ambulatory Visit (INDEPENDENT_AMBULATORY_CARE_PROVIDER_SITE_OTHER): Payer: Managed Care, Other (non HMO) | Admitting: Obstetrics and Gynecology

## 2019-05-04 ENCOUNTER — Other Ambulatory Visit: Payer: Self-pay

## 2019-05-04 ENCOUNTER — Ambulatory Visit (INDEPENDENT_AMBULATORY_CARE_PROVIDER_SITE_OTHER): Payer: Managed Care, Other (non HMO)

## 2019-05-04 VITALS — BP 136/88 | Ht 65.0 in | Wt 273.0 lb

## 2019-05-04 DIAGNOSIS — D251 Intramural leiomyoma of uterus: Secondary | ICD-10-CM

## 2019-05-04 DIAGNOSIS — N8501 Benign endometrial hyperplasia: Secondary | ICD-10-CM

## 2019-05-04 DIAGNOSIS — D252 Subserosal leiomyoma of uterus: Secondary | ICD-10-CM | POA: Diagnosis not present

## 2019-05-04 NOTE — Progress Notes (Signed)
Gynecology Ultrasound Follow Up   Chief Complaint  Patient presents with   Follow-up  Complex endometrial hyperplasia without atypia  History of Present Illness: Patient is a 59 y.o. female who presents today for ultrasound evaluation of the above.  Ultrasound demonstrates the following findings Adnexa: no masses seen  Uterus: anteverted with endometrial stripe not clearly visualized. Additional: multiple fibroids present, last report was of 3 fibroids. Difficult to say whether these are the same fibroids or more due to smaller size of fibroids and technical difficulty of study.  She has had no continued bleeding.   In comparison to a study performed in 01/2017, it is difficult to say whether there are no fibroids or there were more measured.    Past Medical History:  Diagnosis Date   Arthritis    Bell's palsy 2016   right eye droops   Diabetes mellitus without complication ([place])    Hyperlipidemia    Hypertension     Past Surgical History:  Procedure Laterality Date   bell's palsy  2016   right eye sluggish   COLONOSCOPY WITH PROPOFOL N/A 03/31/2018   Procedure: COLONOSCOPY WITH PROPOFOL;  Surgeon: Jonathon Bellows, MD;  [facility]: [facility] END[facility]COPY;  Service: Gastroenterology;  Laterality: N/A;   DILATATION & CURETTAGE/HYSTEROSCOPY WITH MYOSURE N/A 03/31/2017   Procedure: DILATATION & CURETTAGE/HYSTEROSCOPY WITH MYOSURE;  Surgeon: [name], MD;  Location: [facility] ORS;  Service: Gynecology;  Laterality: N/A;   LAPAROSCOPY      Family History  Problem Relation Age of Onset   Hypertension Mother    Diabetes Mother    Hypertension Father    Breast cancer Cousin        PAT COUSIN    Social History   Socioeconomic History   Marital status: Single    Spouse name: Not on file   Number of children: Not on file   Years of education: Not on file   Highest education level: Not on file  Occupational History   Not on file  Tobacco Use   Smoking  status: Former Smoker    Types: Cigarettes   Smokeless tobacco: Never Used   Tobacco comment: 40 years ago  Substance and Sexual Activity   Alcohol use: Yes    Alcohol/week: 2.0 standard drinks    Types: 2 Shots of liquor per week    Comment: occasional   Drug use: No   Sexual activity: Not Currently    Birth control/protection: None  Other Topics Concern   Not on file  Social History Narrative   Not on file   Social Determinants of Health   Financial Resource Strain:    Difficulty of Paying Living Expenses:   Food Insecurity:    Worried About Charity fundraiser in the Last Year:    Arboriculturist in the Last Year:   Transportation Needs:    Film/video editor (Medical):    Lack of Transportation (Non-Medical):   Physical Activity:    Days of Exercise per Week:    Minutes of Exercise per Session:   Stress:    Feeling of Stress :   Social Connections:    Frequency of Communication with Friends and Family:    Frequency of Social Gatherings with Friends and Family:    Attends Religious Services:    Active Member of Clubs or Organizations:    Attends Archivist Meetings:    Marital Status:   Intimate Partner Violence:    Fear of Current or  Ex-Partner:    Emotionally Abused:    Physically Abused:    Sexually Abused:     No Known Allergies  Prior to Admission medications   Medication Sig Start Date End Date Taking? Authorizing Provider  amLODipine (NORVASC) 5 MG tablet Take 1 tablet (5 mg total) by mouth daily. 12/29/18   Glean Hess, MD  Carboxymethylcellul-Glycerin (CLEAR EYES FOR DRY EYES) 1-0.25 % SOLN Place 1 drop into both eyes 3 (three) times daily as needed (dry eyes).    [provider]  carboxymethylcellulose (REFRESH PLUS) 0.5 % SOLN Place 1 drop into both eyes 3 (three) times daily as needed (dry eyes).    [provider]  loratadine (CLARITIN) 10 MG tablet Take 10 mg by mouth daily as needed  for allergies.    [provider]  megestrol (MEGACE) 20 MG tablet TAKE 1 TABLET BY MOUTH  DAILY 04/13/19   [name], MD  metFORMIN (GLUCOPHAGE) 500 MG tablet Take 1 tablet (500 mg total) by mouth 2 (two) times daily with a meal. 09/29/18   Glean Hess, MD  Multiple Vitamins-Minerals (DAILY MULTIVITAMIN PO) Take 2 tablets by mouth daily.    [provider]  olmesartan-hydrochlorothiazide (BENICAR HCT) 40-25 MG tablet TAKE 1 TABLET BY MOUTH DAILY 12/24/18   Glean Hess, MD  [facility] DELICA LANCETS FINE MISC USE 1 LANCET UP TO 4 TIMES  DAILY AS DIRECTED 03/11/17   Glean Hess, MD  [facility] ULTRA test strip USE 1 STRIP UP TO 4 TIMES  DAILY AS DIRECTED 03/17/19   Glean Hess, MD  pravastatin (PRAVACHOL) 20 MG tablet Take 1 tablet (20 mg total) by mouth daily. 09/29/18   Glean Hess, MD  Semaglutide 14 MG TABS Take 14 mg by mouth daily. 01/26/19   Glean Hess, MD    Physical Exam BP 136/88    Ht 5\' 5"  (1.651 m)    Wt 273 lb (123.8 kg)    BMI 45.43 kg/m    General: NAD HEENT: normocephalic, anicteric Pulmonary: No increased work of breathing Extremities: no edema, erythema, or tenderness Neurologic: Grossly intact, normal gait Psychiatric: mood appropriate, affect full  Imaging Results US PELVIC COMPLETE WITH TRANSVAGINAL  Result Date: 05/04/2019 Patient Name: Anita Krueger DOB: 1960-04-23 MRN: [id number] ULTRASOUND REPORT Location: [facility] Date of Service: 05/04/2019 Indications: Endometrial hyperplasia Findings: The uterus is anteverted and measures 8.5 x 7.2 x 6.6 cm. Echo texture is heterogenous with evidence of focal masses. Within the uterus are multiple suspected fibroids measuring: Fibroid 1:38.9 x 37.6 x 38.7 mm. Intramural posterior, partially calcified Fibroid 2:36.2 x 25.6 x 21.3 mm subserosal right Fibroid 3: 22.6 x 19.7 x 18.1 mm subserosal anterior right Fibroid 4: 20.2 x 13.9 x 21.0 mm subserosal fundal Fibroid 5: 32.7 x  20.6 x 30.8 mm. Subserosal left Fibroid 6: 28.0 x 24.3 x 23.5 mm subserosal left The Endometrium was not clearly visualized. Right Ovary measures 2.7 x 1.5 x 1.6  cm. It is normal in appearance. Left Ovary measures 2.4 x 2.1 x 2.0 cm. There are limited views of the right ovary. Survey of the adnexa demonstrates no adnexal masses. There is no free fluid in the cul de sac. Impression: 1. There are numerous uterine fibroids. 2. The largest and most clearly visualized fibroids were measured. 3. The endometrium is not clearly visualized. 4. Normal appearing right ovary. 5. There are limited views of the left ovary. Gweneth Dimitri, RT The ultrasound images and  findings were reviewed by me and I agree with the above report. Prentice Docker, MD, [facility], [facility] 05/04/2019 3:41 PM       Assessment: 59 y.o. G2P0020  1. Complex endometrial hyperplasia without atypia      Plan: Problem List Items Addressed This Visit      Genitourinary   Complex endometrial hyperplasia without atypia - Primary     Plan to discontinued progesterone at this time. Will have her follow up in six months to reassess.  If she has any bleeding in the mean time, she should report this to me. Otherwise, will plan for endometrial biopsy in six months to verify stability of her uterine lining.    A total of 25 minutes were spent face-to-face with the patient as well as preparation, review, communication, and documentation during this encounter about her above problems.    Prentice Docker, MD, [facility], [facility] 05/04/2019 4:33 PM

## 2019-05-18 ENCOUNTER — Telehealth: Payer: Self-pay

## 2019-05-18 ENCOUNTER — Ambulatory Visit: Payer: Managed Care, Other (non HMO) | Admitting: Internal Medicine

## 2019-05-18 NOTE — Telephone Encounter (Signed)
LMVM TRC to schedule apt to discuss further management.

## 2019-05-18 NOTE — Telephone Encounter (Signed)
So, she needs appt for biopsy and discussion of more definitive long-term management.

## 2019-05-18 NOTE — Telephone Encounter (Signed)
Appointment scheduled.

## 2019-05-18 NOTE — Telephone Encounter (Signed)
Patient reports her bleeding has returned and it is really heavy. It's making her feel terrible. She would like to SDJ to let her know what her next steps would be. [id number]

## 2019-06-08 ENCOUNTER — Ambulatory Visit: Payer: Managed Care, Other (non HMO) | Admitting: Obstetrics and Gynecology

## 2019-06-28 ENCOUNTER — Other Ambulatory Visit: Payer: Self-pay | Admitting: Internal Medicine

## 2019-06-29 ENCOUNTER — Encounter: Payer: Self-pay | Admitting: Internal Medicine

## 2019-06-29 ENCOUNTER — Other Ambulatory Visit: Payer: Self-pay

## 2019-06-29 ENCOUNTER — Ambulatory Visit: Payer: Managed Care, Other (non HMO) | Admitting: Internal Medicine

## 2019-06-29 VITALS — BP 112/72 | HR 99 | Temp 98.1°F | Ht 65.0 in | Wt 270.0 lb

## 2019-06-29 DIAGNOSIS — I10 Essential (primary) hypertension: Secondary | ICD-10-CM | POA: Diagnosis not present

## 2019-06-29 DIAGNOSIS — E1169 Type 2 diabetes mellitus with other specified complication: Secondary | ICD-10-CM | POA: Diagnosis not present

## 2019-06-29 DIAGNOSIS — F39 Unspecified mood [affective] disorder: Secondary | ICD-10-CM

## 2019-06-29 DIAGNOSIS — E118 Type 2 diabetes mellitus with unspecified complications: Secondary | ICD-10-CM | POA: Diagnosis not present

## 2019-06-29 DIAGNOSIS — E785 Hyperlipidemia, unspecified: Secondary | ICD-10-CM | POA: Diagnosis not present

## 2019-06-29 HISTORY — DX: Unspecified mood (affective) disorder: F39

## 2019-06-29 MED ORDER — METFORMIN HCL ER 500 MG PO TB24: 500.0000 mg | ORAL_TABLET | Freq: Two times a day (BID) | ORAL | 3 refills | Status: DC

## 2019-06-29 NOTE — Progress Notes (Signed)
Date:  06/29/2019   Name:  Anita Krueger   DOB:  03-18-1960   MRN:  [id number]   Chief Complaint: Diabetes (4 month follow up. ) and Hypertension  Immunization History  Administered Date(s) Administered  . Influenza,inj,Quad PF,6+ Mos 01/14/2017, 12/15/2017, 12/29/2018  . Influenza-Unspecified 12/10/2014, 12/09/2017  . PFIZER SARS-COV-2 Vaccination 05/08/2019, 05/29/2019  . Pneumococcal Polysaccharide-23 09/23/2017    Hypertension Pertinent negatives include no chest pain, headaches, palpitations or shortness of breath. Past treatments include ACE inhibitors, diuretics and calcium channel blockers. The current treatment provides significant improvement. There are no compliance problems.   Diabetes She presents for her follow-up diabetic visit. She has type 2 diabetes mellitus. Her disease course has been stable. Pertinent negatives for hypoglycemia include no headaches or tremors. Pertinent negatives for diabetes include no chest pain, no fatigue, no polydipsia and no polyuria. There are no diabetic complications. Current diabetic treatment includes oral agent (monotherapy). She is compliant with treatment all of the time. She monitors blood glucose at home 1-2 x per day. Her breakfast blood glucose is taken between 7-8 am. Her breakfast blood glucose range is generally 130-140 mg/dl. An ACE inhibitor/angiotensin II receptor blocker is being taken. Eye exam is current.    Lab Results  Component Value Date   CREATININE 0.82 01/26/2019   BUN 25 (H) 01/26/2019   NA 138 01/26/2019   K 4.6 01/26/2019   CL 98 01/26/2019   CO2 24 01/26/2019   Lab Results  Component Value Date   CHOL 170 01/26/2019   HDL 39 (L) 01/26/2019   LDLCALC 109 (H) 01/26/2019   TRIG 120 01/26/2019   CHOLHDL 4.4 01/26/2019   Lab Results  Component Value Date   TSH 1.190 01/26/2019   Lab Results  Component Value Date   HGBA1C 7.2 (H) 01/26/2019   Lab Results  Component Value Date   WBC 10.4 01/26/2019    HGB 13.3 01/26/2019   HCT 40.1 01/26/2019   MCV 91 01/26/2019   PLT 290 01/26/2019   Lab Results  Component Value Date   ALT 53 (H) 01/26/2019   AST 49 (H) 01/26/2019   ALKPHOS 67 01/26/2019   BILITOT 0.4 01/26/2019     Review of Systems  Constitutional: Negative for appetite change, fatigue, fever and unexpected weight change.  HENT: Negative for tinnitus and trouble swallowing.   Eyes: Negative for visual disturbance.  Respiratory: Negative for cough, chest tightness and shortness of breath.   Cardiovascular: Negative for chest pain, palpitations and leg swelling.  Gastrointestinal: Positive for diarrhea. Negative for abdominal pain.  Endocrine: Negative for polydipsia and polyuria.  Genitourinary: Negative for dysuria and hematuria.  Musculoskeletal: Negative for arthralgias.  Neurological: Negative for tremors, numbness and headaches.  Psychiatric/Behavioral: Negative for dysphoric mood.    Patient Active Problem List   Diagnosis Date Noted  . Tubular adenoma of colon 04/03/2018  . Iron deficiency anemia 01/24/2018  . 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 Menorrhagia with irregular cycle 01/19/2017  . Hip pain, acute, left 05/12/2016  . Essential hypertension 04/04/2015  . DM (diabetes mellitus) with complications ([place]) 0000000  . Hyperlipidemia associated with type 2 diabetes mellitus ([place]) 09/29/2014    No Known Allergies  Past Surgical History:  Procedure Laterality Date  . bell's palsy  2016   right eye sluggish  . COLONOSCOPY WITH PROPOFOL N/A 03/31/2018   Procedure: COLONOSCOPY WITH PROPOFOL;  Surgeon: Jonathon Bellows, MD;  Location: [facility] ENDOSCOPY;  Service: Gastroenterology;  Laterality: N/A;  .  DILATATION & CURETTAGE/HYSTEROSCOPY WITH MYOSURE N/A 03/31/2017   Procedure: DILATATION & CURETTAGE/HYSTEROSCOPY WITH MYOSURE;  Surgeon: [name], MD;  Location: [facility] ORS;  Service: Gynecology;  Laterality: N/A;  . LAPAROSCOPY       Social History   Tobacco Use  . Smoking status: Former Smoker    Types: Cigarettes  . Smokeless tobacco: Never Used  . Tobacco comment: 40 years ago  Substance Use Topics  . Alcohol use: Yes    Alcohol/week: 2.0 standard drinks    Types: 2 Shots of liquor per week    Comment: occasional  . Drug use: No     Medication list has been reviewed and updated.  Current Meds  Medication Sig  . amLODipine (NORVASC) 5 MG tablet Take 1 tablet (5 mg total) by mouth daily.  . Carboxymethylcellul-Glycerin (CLEAR EYES FOR DRY EYES) 1-0.25 % SOLN Place 1 drop into both eyes 3 (three) times daily as needed (dry eyes).  . carboxymethylcellulose (REFRESH PLUS) 0.5 % SOLN Place 1 drop into both eyes 3 (three) times daily as needed (dry eyes).  Marland Kitchen loratadine (CLARITIN) 10 MG tablet Take 10 mg by mouth daily as needed for allergies.  . metFORMIN (GLUCOPHAGE) 500 MG tablet Take 1 tablet (500 mg total) by mouth 2 (two) times daily with a meal.  . Multiple Vitamins-Minerals (DAILY MULTIVITAMIN PO) Take 2 tablets by mouth daily.  Marland Kitchen olmesartan-hydrochlorothiazide (BENICAR HCT) 40-25 MG tablet TAKE 1 TABLET BY MOUTH DAILY  . ONETOUCH DELICA LANCETS FINE MISC USE 1 LANCET UP TO 4 TIMES  DAILY AS DIRECTED  . ONETOUCH ULTRA test strip USE 1 STRIP UP TO 4 TIMES  DAILY AS DIRECTED  . pravastatin (PRAVACHOL) 20 MG tablet Take 1 tablet (20 mg total) by mouth daily.  . Semaglutide 14 MG TABS Take 14 mg by mouth daily.    PHQ 2/9 Scores 06/29/2019 01/26/2019 12/29/2018 09/29/2018  PHQ - 2 Score 2 2 1 1   PHQ- 9 Score 8 8 8 1     BP Readings from Last 3 Encounters:  06/29/19 112/72  05/04/19 136/88  04/02/19 136/68    Physical Exam Vitals and nursing note reviewed.  Constitutional:      General: She is not in acute distress.    Appearance: She is well-developed.  HENT:     Head: Normocephalic and atraumatic.  Cardiovascular:     Rate and Rhythm: Normal rate and regular rhythm.     Pulses: Normal  pulses.     Heart sounds: No murmur.  Pulmonary:     Effort: Pulmonary effort is normal. No respiratory distress.     Breath sounds: No wheezing or rhonchi.  Musculoskeletal:     Cervical back: Normal range of motion.     Right lower leg: No edema.     Left lower leg: No edema.  Lymphadenopathy:     Cervical: No cervical adenopathy.  Skin:    General: Skin is warm and dry.     Capillary Refill: Capillary refill takes less than 2 seconds.     Findings: No rash.  Neurological:     General: No focal deficit present.     Mental Status: She is alert and oriented to person, place, and time.  Psychiatric:        Behavior: Behavior normal.        Thought Content: Thought content normal.     Wt Readings from Last 3 Encounters:  06/29/19 270 lb (122.5 kg)  05/04/19 273 lb (123.8 kg)  04/02/19 269 lb (122 kg)    BP 112/72   Pulse 99   Temp 98.1 F (36.7 C) (Oral)   Ht 5\' 5"  (1.651 m)   Wt 270 lb (122.5 kg)   SpO2 95%   BMI 44.93 kg/m   Assessment and Plan: 1. Essential hypertension Clinically stable exam with well controlled BP on three agents. Tolerating medications without side effects at this time. Pt to continue current regimen and low sodium diet; benefits of regular exercise as able discussed. - Basic metabolic panel  2. DM (diabetes mellitus) with complications ([place]) Clinically stable by exam and report without s/s of hypoglycemia. DM complicated by htn. Tolerating medications fairly well but having some loose stool from metformin.  Will change metformin to XR formulation - Hemoglobin [id number] - Basic metabolic panel - metFORMIN (GLUCOPHAGE-XR) 500 MG 24 hr tablet; Take 1 tablet (500 mg total) by mouth 2 (two) times daily before a meal.  Dispense: 180 tablet; Refill: 3  3. Hyperlipidemia associated with type 2 diabetes mellitus ([place]) Tolerating statin medication without side effects at this time LDL is not at goal of < 70 on current dose Continue same therapy without  change at this time.   Partially dictated using Editor, commissioning. Any errors are unintentional.  Halina Maidens, MD [facility]  06/29/2019

## 2019-06-30 LAB — BASIC METABOLIC PANEL
BUN/Creatinine Ratio: 24 — ABNORMAL HIGH (ref 9–23)
BUN: 21 mg/dL (ref 6–24)
CO2: 22 mmol/L (ref 20–29)
Calcium: 10.2 mg/dL (ref 8.7–10.2)
Chloride: 103 mmol/L (ref 96–106)
Creatinine, Ser: 0.87 mg/dL (ref 0.57–1.00)
GFR calc Af Amer: 86 mL/min/{1.73_m2} (ref >59)
GFR calc non Af Amer: 74 mL/min/{1.73_m2} (ref >59)
Glucose: 86 mg/dL (ref 65–99)
Potassium: 4.2 mmol/L (ref 3.5–5.2)
Sodium: 145 mmol/L — ABNORMAL HIGH (ref 134–144)

## 2019-06-30 LAB — HEMOGLOBIN A1C
Est. average glucose Bld gHb Est-mCnc: 120 mg/dL
Hgb A1c MFr Bld: 5.8 % — ABNORMAL HIGH (ref 4.8–5.6)

## 2019-08-20 ENCOUNTER — Other Ambulatory Visit: Payer: Self-pay | Admitting: Internal Medicine

## 2019-08-20 DIAGNOSIS — E1169 Type 2 diabetes mellitus with other specified complication: Secondary | ICD-10-CM

## 2019-10-01 ENCOUNTER — Telehealth: Payer: Self-pay

## 2019-10-01 NOTE — Telephone Encounter (Signed)
Sent in [place] waiting for response from insurance.  Key: [id number]  KP

## 2019-10-02 NOTE — Telephone Encounter (Signed)
Received FAX from [facility] for more additional information for patients rybelsus. Completed the forms and attached recent A1C and office visit note to the PA questions.  Faxed back to [phone]- [employer] PA Line.  Awaiting outcome.  CM

## 2019-10-03 NOTE — Telephone Encounter (Signed)
Patient stated that her insurance denied the rybelsus, and she would like to know if PCP could complete a RX review stating that she needs to be on this medication, so it can be approved. Also patient would like to stay on current type of medication and not use injections.

## 2019-10-03 NOTE — Telephone Encounter (Signed)
Patient medication was denied by [facility]. Denial letter states patient needs to try covered drugs : Trulicity Victoza Ozempic  Will give forms to Dr Army Melia to review and decide next medication steps.  CM

## 2019-10-09 ENCOUNTER — Other Ambulatory Visit: Payer: Self-pay | Admitting: Internal Medicine

## 2019-10-09 NOTE — Telephone Encounter (Signed)
Requested medication (s) are due for refill today:  Yes  Requested medication (s) are on the active medication list:  No  Future visit scheduled:  Yes  Last Refill: n/a  Notes to clinic:  pt. Is requesting a new device kit for blood sugar monitoring; "Contour Next Kit"  Requested Prescriptions  Pending Prescriptions Disp Refills   Blood Glucose Monitoring Suppl (CONTOUR NEXT MONITOR) w/Device KIT [Pharmacy Med Name: CONTOUR NEXT KIT]      Sig: USE AS DIRECTED      Endocrinology: Diabetes - Testing Supplies Passed - 10/09/2019 11:31 AM      Passed - Valid encounter within last 12 months    Recent Outpatient Visits           3 months ago Essential hypertension   [facility] Glean Hess, MD   8 months ago Annual physical exam   [facility] Glean Hess, MD   9 months ago DM (diabetes mellitus) with complications [facility])   [facility] Glean Hess, MD   1 year ago DM (diabetes mellitus) with complications [facility])   [facility] Glean Hess, MD   1 year ago DM (diabetes mellitus) with complications [facility])   [facility] Glean Hess, MD       Future Appointments             In 3 weeks Army Melia Jesse Sans, MD [facility], [place]   In 3 months Army Melia, Jesse Sans, MD [facility], [facility]

## 2019-10-16 ENCOUNTER — Other Ambulatory Visit: Payer: Self-pay | Admitting: Internal Medicine

## 2019-10-16 DIAGNOSIS — E118 Type 2 diabetes mellitus with unspecified complications: Secondary | ICD-10-CM

## 2019-10-16 MED ORDER — ONETOUCH DELICA PLUS LANCET33G MISC: 1.0000 | Freq: Two times a day (BID) | 3 refills | Status: DC

## 2019-10-17 ENCOUNTER — Other Ambulatory Visit: Payer: Self-pay

## 2019-10-17 MED ORDER — DAPAGLIFLOZIN PROPANEDIOL 10 MG PO TABS: 10.0000 mg | ORAL_TABLET | Freq: Every day | ORAL | 1 refills | Status: DC

## 2019-10-17 NOTE — Telephone Encounter (Signed)
Pt calling back to inquire if the review had been done on her med rybelsus. Pt would like call back

## 2019-10-17 NOTE — Telephone Encounter (Signed)
Pt will try farxiga because she does not want to be on a injectable medication.  CM

## 2019-10-17 NOTE — Telephone Encounter (Signed)
Since insurance will not approve Rybelsus, I recommend trying Farxiga 10 mg once a day.

## 2019-10-31 ENCOUNTER — Other Ambulatory Visit: Payer: Self-pay

## 2019-10-31 DIAGNOSIS — E118 Type 2 diabetes mellitus with unspecified complications: Secondary | ICD-10-CM

## 2019-10-31 MED ORDER — ONETOUCH ULTRA VI STRP: ORAL_STRIP | 12 refills | Status: DC

## 2019-11-02 ENCOUNTER — Other Ambulatory Visit: Payer: Self-pay

## 2019-11-02 ENCOUNTER — Ambulatory Visit (INDEPENDENT_AMBULATORY_CARE_PROVIDER_SITE_OTHER): Payer: BC Managed Care – PPO | Admitting: Internal Medicine

## 2019-11-02 ENCOUNTER — Ambulatory Visit: Payer: Managed Care, Other (non HMO) | Admitting: Obstetrics and Gynecology

## 2019-11-02 ENCOUNTER — Encounter: Payer: Self-pay | Admitting: Internal Medicine

## 2019-11-02 VITALS — BP 118/70 | HR 100 | Ht 65.0 in | Wt 263.0 lb

## 2019-11-02 DIAGNOSIS — E785 Hyperlipidemia, unspecified: Secondary | ICD-10-CM

## 2019-11-02 DIAGNOSIS — Z23 Encounter for immunization: Secondary | ICD-10-CM | POA: Diagnosis not present

## 2019-11-02 DIAGNOSIS — E1169 Type 2 diabetes mellitus with other specified complication: Secondary | ICD-10-CM | POA: Diagnosis not present

## 2019-11-02 DIAGNOSIS — E118 Type 2 diabetes mellitus with unspecified complications: Secondary | ICD-10-CM

## 2019-11-02 NOTE — Patient Instructions (Addendum)
Double up on Pravastatin and take 2 pills per day.  If no side effects, call for a new prescription for 40 mg when needed  Check on coverage of  Pills:   Jardiance             Invokana             Januvia   Tradjenta   Onglyza   Injection:    Ozempic          Trulicity

## 2019-11-02 NOTE — Progress Notes (Signed)
Date:  11/02/2019   Name:  Anita Krueger   DOB:  09/22/1960   MRN:  [id number]   Chief Complaint: Diabetes (4 month follow up.), Hypertension (4 month follow up. ), and Flu Vaccine (Reg dose flu shot.)  Diabetes She presents for her follow-up diabetic visit. She has type 2 diabetes mellitus. Her disease course has been stable. Pertinent negatives for hypoglycemia include no dizziness, headaches or tremors. Pertinent negatives for diabetes include no chest pain, no fatigue, no polydipsia and no polyuria. Current diabetic treatment includes oral agent (triple therapy) (metformin; farxiga/rybelsus (too $)). She is compliant with treatment all of the time. An ACE inhibitor/angiotensin II receptor blocker is being taken. Eye exam is not current.  Hyperlipidemia This is a chronic problem. Condition status: improved but not at goal. Exacerbating diseases include diabetes. Pertinent negatives include no chest pain or shortness of breath. Current antihyperlipidemic treatment includes statins. The current treatment provides moderate improvement of lipids.    Lab Results  Component Value Date   CREATININE 0.87 06/29/2019   BUN 21 06/29/2019   NA 145 (H) 06/29/2019   K 4.2 06/29/2019   CL 103 06/29/2019   CO2 22 06/29/2019   Lab Results  Component Value Date   CHOL 170 01/26/2019   HDL 39 (L) 01/26/2019   LDLCALC 109 (H) 01/26/2019   TRIG 120 01/26/2019   CHOLHDL 4.4 01/26/2019   Lab Results  Component Value Date   TSH 1.190 01/26/2019   Lab Results  Component Value Date   HGBA1C 5.8 (H) 06/29/2019   Lab Results  Component Value Date   WBC 10.4 01/26/2019   HGB 13.3 01/26/2019   HCT 40.1 01/26/2019   MCV 91 01/26/2019   PLT 290 01/26/2019   Lab Results  Component Value Date   ALT 53 (H) 01/26/2019   AST 49 (H) 01/26/2019   ALKPHOS 67 01/26/2019   BILITOT 0.4 01/26/2019     Review of Systems  Constitutional: Positive for unexpected weight change (continues to lose weight  with diet and medication). Negative for appetite change, fatigue and fever.  HENT: Negative for tinnitus and trouble swallowing.   Eyes: Negative for visual disturbance.  Respiratory: Negative for cough, chest tightness and shortness of breath.   Cardiovascular: Negative for chest pain, palpitations and leg swelling.  Gastrointestinal: Negative for abdominal pain.  Endocrine: Negative for polydipsia and polyuria.  Genitourinary: Negative for dysuria.  Musculoskeletal: Negative for arthralgias.  Neurological: Negative for dizziness, tremors, numbness and headaches.  Psychiatric/Behavioral: Negative for dysphoric mood.    Patient Active Problem List   Diagnosis Date Noted  . Tubular adenoma of colon 04/03/2018  . Iron deficiency anemia 01/24/2018  . 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 Menorrhagia with irregular cycle 01/19/2017  . Hip pain, acute, left 05/12/2016  . Essential hypertension 04/04/2015  . DM (diabetes mellitus) with complications ([place]) 37/16/9678  . Hyperlipidemia associated with type 2 diabetes mellitus ([place]) 09/29/2014    No Known Allergies  Past Surgical History:  Procedure Laterality Date  . bell's palsy  2016   right eye sluggish  . COLONOSCOPY WITH PROPOFOL N/A 03/31/2018   Procedure: COLONOSCOPY WITH PROPOFOL;  Surgeon: Jonathon Bellows, MD;  Location: [facility] ENDOSCOPY;  Service: Gastroenterology;  Laterality: N/A;  . DILATATION & CURETTAGE/HYSTEROSCOPY WITH MYOSURE N/A 03/31/2017   Procedure: DILATATION & CURETTAGE/HYSTEROSCOPY WITH MYOSURE;  Surgeon: [name], MD;  Location: [facility] ORS;  Service: Gynecology;  Laterality: N/A;  . LAPAROSCOPY  Social History   Tobacco Use  . Smoking status: Former Smoker    Types: Cigarettes  . Smokeless tobacco: Never Used  . Tobacco comment: 40 years ago  Vaping Use  . Vaping Use: Never assessed  Substance Use Topics  . Alcohol use: Yes    Alcohol/week: 2.0 standard drinks    Types: 2  Shots of liquor per week    Comment: occasional  . Drug use: No     Medication list has been reviewed and updated.  Current Meds  Medication Sig  . amLODipine (NORVASC) 5 MG tablet Take 1 tablet (5 mg total) by mouth daily.  . Blood Glucose Monitoring Suppl (CONTOUR NEXT MONITOR) w/Device KIT USE AS DIRECTED  . Carboxymethylcellul-Glycerin (CLEAR EYES FOR DRY EYES) 1-0.25 % SOLN Place 1 drop into both eyes 3 (three) times daily as needed (dry eyes).  . carboxymethylcellulose (REFRESH PLUS) 0.5 % SOLN Place 1 drop into both eyes 3 (three) times daily as needed (dry eyes).  Marland Kitchen glucose blood (ONETOUCH ULTRA) test strip USE 1 STRIP UP TO 4 TIMES  DAILY AS DIRECTED  . Lancets (ONETOUCH DELICA PLUS [id number]) MISC 1 each by Does not apply route in the morning and at bedtime.  Marland Kitchen loratadine (CLARITIN) 10 MG tablet Take 10 mg by mouth daily as needed for allergies.  . metFORMIN (GLUCOPHAGE-XR) 500 MG 24 hr tablet Take 1 tablet (500 mg total) by mouth 2 (two) times daily before a meal.  . olmesartan-hydrochlorothiazide (BENICAR HCT) 40-25 MG tablet TAKE 1 TABLET BY MOUTH DAILY  . pravastatin (PRAVACHOL) 20 MG tablet TAKE 1 TABLET BY MOUTH  DAILY    PHQ 2/9 Scores 06/29/2019 01/26/2019 12/29/2018 09/29/2018  PHQ - 2 Score _0 PHQ- 9 Score _1 GAD 7 : Generalized Anxiety Score 06/29/2019  Nervous, Anxious, on Edge 1  Control/stop worrying 0  Worry too much - different things 0  Trouble relaxing 0  Restless 1  Easily annoyed or irritable 0  Afraid - awful might happen 0  Total GAD 7 Score 2  Anxiety Difficulty Not difficult at all    BP Readings from Last 3 Encounters:  11/02/19 118/70  06/29/19 112/72  05/04/19 136/88    Physical Exam Vitals and nursing note reviewed.  Constitutional:      General: She is not in acute distress.    Appearance: She is well-developed.  HENT:     Head: Normocephalic and atraumatic.  Cardiovascular:     Rate and Rhythm: Normal rate and  regular rhythm.     Pulses: Normal pulses.     Heart sounds: No murmur heard.   Pulmonary:     Effort: Pulmonary effort is normal. No respiratory distress.     Breath sounds: No wheezing or rhonchi.  Musculoskeletal:     Cervical back: Normal range of motion.     Right lower leg: No edema.     Left lower leg: No edema.  Lymphadenopathy:     Cervical: No cervical adenopathy.  Skin:    General: Skin is warm and dry.     Capillary Refill: Capillary refill takes less than 2 seconds.     Findings: No rash.  Neurological:     General: No focal deficit present.     Mental Status: She is alert and oriented to person, place, and time.  Psychiatric:        Mood and Affect: Mood normal.  Behavior: Behavior normal.     Wt Readings from Last 3 Encounters:  11/02/19 263 lb (119.3 kg)  06/29/19 270 lb (122.5 kg)  05/04/19 273 lb (123.8 kg)    BP 118/70   Pulse 100   Ht _0  (1.651 m)   Wt 263 lb (119.3 kg)   SpO2 96%   BMI 43.77 kg/m   Assessment and Plan:L 1. DM (diabetes mellitus) with complications (HCC) Clinically stable by exam and report without s/s of hypoglycemia. DM complicated by lipids. Tolerating medications well without side effects or other concerns but having issues getting medication covered.  Will continue metformin alone and pt will check on coverage of alternatives. - Hemoglobin A1c  2. Hyperlipidemia associated with type 2 diabetes mellitus ([place]) Not quite at goal - will double pravastatin to 40 mg and recheck next visit   Partially dictated using [place]. Any errors are unintentional.  Halina Maidens, MD [facility]  11/02/2019

## 2019-11-03 LAB — HEMOGLOBIN A1C
Est. average glucose Bld gHb Est-mCnc: 128 mg/dL
Hgb A1c MFr Bld: 6.1 % — ABNORMAL HIGH (ref 4.8–5.6)

## 2019-12-12 ENCOUNTER — Other Ambulatory Visit: Payer: Self-pay | Admitting: Internal Medicine

## 2019-12-12 DIAGNOSIS — I10 Essential (primary) hypertension: Secondary | ICD-10-CM

## 2019-12-12 NOTE — Telephone Encounter (Signed)
Requested Prescriptions  Pending Prescriptions Disp Refills   amLODipine (NORVASC) 5 MG tablet [Pharmacy Med Name: amLODIPine Besylate 5 MG Oral Tablet] 90 tablet 1    Sig: TAKE 1 TABLET BY MOUTH  DAILY     Cardiovascular:  Calcium Channel Blockers Passed - 12/12/2019  5:24 AM      Passed - Last BP in normal range    BP Readings from Last 1 Encounters:  11/02/19 118/70         Passed - Valid encounter within last 6 months    Recent Outpatient Visits          1 month ago DM (diabetes mellitus) with complications [facility])   [facility] Glean Hess, MD   5 months ago Essential hypertension   [place], MD   10 months ago Annual physical exam   [facility] Glean Hess, MD   11 months ago DM (diabetes mellitus) with complications [facility])   [facility] Glean Hess, MD   1 year ago DM (diabetes mellitus) with complications [facility])   [facility] Glean Hess, MD      Future Appointments            In 1 month Army Melia, Jesse Sans, MD [facility], [facility]

## 2020-01-25 DIAGNOSIS — H2513 Age-related nuclear cataract, bilateral: Secondary | ICD-10-CM | POA: Diagnosis not present

## 2020-01-25 LAB — HM DIABETES EYE EXAM

## 2020-01-27 ENCOUNTER — Other Ambulatory Visit: Payer: Self-pay | Admitting: Internal Medicine

## 2020-01-27 NOTE — Telephone Encounter (Signed)
Requested Prescriptions  Pending Prescriptions Disp Refills  . olmesartan-hydrochlorothiazide (BENICAR HCT) 40-25 MG tablet [Pharmacy Med Name: OLMESARTAN MEDOX/HCTZ 40-25MG  TAB] 90 tablet 0    Sig: TAKE 1 TABLET BY MOUTH DAILY     Cardiovascular: ARB + Diuretic Combos Failed - 01/27/2020  8:04 AM      Failed - K in normal range and within 180 days    Potassium  Date Value Ref Range Status  06/29/2019 4.2 3.5 - 5.2 mmol/L Final         Failed - Na in normal range and within 180 days    Sodium  Date Value Ref Range Status  06/29/2019 145 (H) 134 - 144 mmol/L Final         Failed - Cr in normal range and within 180 days    Creatinine, Ser  Date Value Ref Range Status  06/29/2019 0.87 0.57 - 1.00 mg/dL Final         Failed - Ca in normal range and within 180 days    Calcium  Date Value Ref Range Status  06/29/2019 10.2 8.7 - 10.2 mg/dL Final         Passed - Patient is not pregnant      Passed - Last BP in normal range    BP Readings from Last 1 Encounters:  11/02/19 118/70         Passed - Valid encounter within last 6 months    Recent Outpatient Visits          2 months ago DM (diabetes mellitus) with complications [facility])   [place], Laura H, MD   7 months ago Essential hypertension   [place], Laura H, MD   1 year ago Annual physical exam   [facility] Glean Hess, MD   1 year ago DM (diabetes mellitus) with complications [facility])   [place], Laura H, MD   1 year ago DM (diabetes mellitus) with complications [facility])   [place], [place], MD      Future Appointments            Tomorrow Glean Hess, MD [facility], [place]

## 2020-01-28 ENCOUNTER — Other Ambulatory Visit: Payer: Self-pay

## 2020-01-28 ENCOUNTER — Ambulatory Visit (INDEPENDENT_AMBULATORY_CARE_PROVIDER_SITE_OTHER): Payer: BC Managed Care – PPO | Admitting: Internal Medicine

## 2020-01-28 ENCOUNTER — Encounter: Payer: Self-pay | Admitting: Internal Medicine

## 2020-01-28 VITALS — BP 124/74 | HR 90 | Temp 97.8°F | Ht 65.0 in | Wt 272.0 lb

## 2020-01-28 DIAGNOSIS — E118 Type 2 diabetes mellitus with unspecified complications: Secondary | ICD-10-CM | POA: Diagnosis not present

## 2020-01-28 DIAGNOSIS — E785 Hyperlipidemia, unspecified: Secondary | ICD-10-CM

## 2020-01-28 DIAGNOSIS — Z1231 Encounter for screening mammogram for malignant neoplasm of breast: Secondary | ICD-10-CM | POA: Diagnosis not present

## 2020-01-28 DIAGNOSIS — E1169 Type 2 diabetes mellitus with other specified complication: Secondary | ICD-10-CM

## 2020-01-28 DIAGNOSIS — I10 Essential (primary) hypertension: Secondary | ICD-10-CM | POA: Diagnosis not present

## 2020-01-28 DIAGNOSIS — Z0000 Encounter for general adult medical examination without abnormal findings: Secondary | ICD-10-CM

## 2020-01-28 LAB — POCT URINALYSIS DIPSTICK
Bilirubin, UA: NEGATIVE
Blood, UA: NEGATIVE
Glucose, UA: NEGATIVE
Ketones, UA: NEGATIVE
Leukocytes, UA: NEGATIVE
Nitrite, UA: NEGATIVE
Protein, UA: NEGATIVE
Spec Grav, UA: 1.015 (ref 1.010–1.025)
Urobilinogen, UA: 0.2 E.U./dL
pH, UA: 6 (ref 5.0–8.0)

## 2020-01-28 MED ORDER — PRAVASTATIN SODIUM 40 MG PO TABS: 40.0000 mg | ORAL_TABLET | Freq: Every day | ORAL | 3 refills | Status: DC

## 2020-01-28 NOTE — Progress Notes (Signed)
Date:  01/28/2020   Name:  Anita Krueger   DOB:  1960-11-05   MRN:  [id number]   Chief Complaint: Annual Exam (Foot exam, breast exam no pap, [facility] /)  Anita Krueger is a 59 y.o. female who presents today for her Complete Annual Exam. She feels well. She reports exercising none. She reports she is sleeping well. Breast complaints none.  Mammogram: 04/2019 DEXA: none Pap smear: 01/2017 negative with cotesting Colonoscopy: 03/2018  Immunization History  Administered Date(s) Administered  . Influenza,inj,Quad PF,6+ Mos 01/14/2017, 12/15/2017, 12/29/2018, 11/02/2019  . Influenza-Unspecified 12/10/2014, 12/09/2017  . PFIZER SARS-COV-2 Vaccination 05/08/2019, 05/29/2019  . Pneumococcal Polysaccharide-23 09/23/2017    Hypertension This is a chronic problem. The problem is controlled. Pertinent negatives include no chest pain, headaches, palpitations or shortness of breath. Past treatments include angiotensin blockers, diuretics and calcium channel blockers. The current treatment provides significant improvement. There are no compliance problems.  There is no history of kidney disease or CAD/MI.  Diabetes She presents for her follow-up diabetic visit. She has type 2 diabetes mellitus. Her disease course has been stable. Pertinent negatives for hypoglycemia include no dizziness, headaches, nervousness/anxiousness or tremors. Pertinent negatives for diabetes include no chest pain, no fatigue, no polydipsia and no polyuria. Symptoms are stable. Current diabetic treatment includes oral agent (monotherapy) (unable to afford Rybelsus). She is compliant with treatment all of the time. Her weight is increasing steadily. An ACE inhibitor/angiotensin II receptor blocker is being taken. Eye exam is current.  Hyperlipidemia This is a chronic problem. The problem is controlled. Pertinent negatives include no chest pain or shortness of breath. Current antihyperlipidemic treatment includes  statins. The current treatment provides moderate improvement of lipids.    Lab Results  Component Value Date   CREATININE 0.87 06/29/2019   BUN 21 06/29/2019   NA 145 (H) 06/29/2019   K 4.2 06/29/2019   CL 103 06/29/2019   CO2 22 06/29/2019   Lab Results  Component Value Date   CHOL 170 01/26/2019   HDL 39 (L) 01/26/2019   LDLCALC 109 (H) 01/26/2019   TRIG 120 01/26/2019   CHOLHDL 4.4 01/26/2019   Lab Results  Component Value Date   TSH 1.190 01/26/2019   Lab Results  Component Value Date   HGBA1C 6.1 (H) 11/02/2019   Lab Results  Component Value Date   WBC 10.4 01/26/2019   HGB 13.3 01/26/2019   HCT 40.1 01/26/2019   MCV 91 01/26/2019   PLT 290 01/26/2019   Lab Results  Component Value Date   ALT 53 (H) 01/26/2019   AST 49 (H) 01/26/2019   ALKPHOS 67 01/26/2019   BILITOT 0.4 01/26/2019     Review of Systems  Constitutional: Negative for chills, fatigue and fever.  HENT: Negative for congestion, hearing loss, tinnitus, trouble swallowing and voice change.   Eyes: Negative for visual disturbance.  Respiratory: Negative for cough, chest tightness, shortness of breath and wheezing.   Cardiovascular: Negative for chest pain, palpitations and leg swelling.  Gastrointestinal: Negative for abdominal pain, constipation, diarrhea and vomiting.  Endocrine: Negative for polydipsia and polyuria.  Genitourinary: Negative for dysuria, frequency, genital sores, vaginal bleeding and vaginal discharge.  Musculoskeletal: Negative for arthralgias, gait problem and joint swelling.  Skin: Negative for color change and rash.  Neurological: Negative for dizziness, tremors, light-headedness and headaches.  Hematological: Negative for adenopathy. Does not bruise/bleed easily.  Psychiatric/Behavioral: Negative for dysphoric mood and sleep disturbance. The patient is not nervous/anxious.  Patient Active Problem List   Diagnosis Date Noted  . Tubular adenoma of colon 04/03/2018   . Iron deficiency anemia 01/24/2018  . 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 Menorrhagia with irregular cycle 01/19/2017  . Hip pain, acute, left 05/12/2016  . Essential hypertension 04/04/2015  . Type II diabetes mellitus with complication ([place]) 69/79/4801  . Hyperlipidemia associated with type 2 diabetes mellitus ([place]) 09/29/2014    No Known Allergies  Past Surgical History:  Procedure Laterality Date  . bell's palsy  2016   right eye sluggish  . COLONOSCOPY WITH PROPOFOL N/A 03/31/2018   Procedure: COLONOSCOPY WITH PROPOFOL;  Surgeon: Jonathon Bellows, MD;  Location[facility] ENDOSCOPY;  Service: Gastroenterology;  Laterality: N/A;  . DILATATION & CURETTAGE/HYSTEROSCOPY WITH MYOSURE N/A 03/31/2017   Procedure: DILATATION & CURETTAGE/HYSTEROSCOPY WITH MYOSURE;  Surgeon: [name], MD;  Location: [facility] ORS;  Service: Gynecology;  Laterality: N/A;  . LAPAROSCOPY      Social History   Tobacco Use  . Smoking status: Former Smoker    Types: Cigarettes  . Smokeless tobacco: Never Used  . Tobacco comment: 40 years ago  Substance Use Topics  . Alcohol use: Yes    Alcohol/week: 2.0 standard drinks    Types: 2 Shots of liquor per week    Comment: occasional  . Drug use: No     Medication list has been reviewed and updated.  Current Meds  Medication Sig  . amLODipine (NORVASC) 5 MG tablet TAKE 1 TABLET BY MOUTH  DAILY  . Blood Glucose Monitoring Suppl (CONTOUR NEXT MONITOR) w/Device KIT USE AS DIRECTED  . Carboxymethylcellul-Glycerin 1-0.25 % SOLN Place 1 drop into both eyes 3 (three) times daily as needed (dry eyes).  . carboxymethylcellulose (REFRESH PLUS) 0.5 % SOLN Place 1 drop into both eyes 3 (three) times daily as needed (dry eyes).  . Ferrous Sulfate (IRON PO) Take by mouth daily.  Marland Kitchen glucose blood (ONETOUCH ULTRA) test strip USE 1 STRIP UP TO 4 TIMES  DAILY AS DIRECTED  . Lancets (ONETOUCH DELICA PLUS [id number]) MISC 1 each by Does not apply route  in the morning and at bedtime.  Marland Kitchen loratadine (CLARITIN) 10 MG tablet Take 10 mg by mouth daily as needed for allergies.  . metFORMIN (GLUCOPHAGE-XR) 500 MG 24 hr tablet Take 1 tablet (500 mg total) by mouth 2 (two) times daily before a meal.  . Multiple Vitamins-Minerals (DAILY MULTIVITAMIN PO) Take 2 tablets by mouth daily.  Marland Kitchen olmesartan-hydrochlorothiazide (BENICAR HCT) 40-25 MG tablet TAKE 1 TABLET BY MOUTH DAILY  . pravastatin (PRAVACHOL) 20 MG tablet TAKE 1 TABLET BY MOUTH  DAILY    PHQ 2/9 Scores 01/28/2020 06/29/2019 01/26/2019 12/29/2018  PHQ - 2 Score 0 2 2 1   PHQ- 9 Score 0 8 8 8     GAD 7 : Generalized Anxiety Score 01/28/2020 06/29/2019  Nervous, Anxious, on Edge 1 1  Control/stop worrying 1 0  Worry too much - different things 1 0  Trouble relaxing 0 0  Restless 0 1  Easily annoyed or irritable 0 0  Afraid - awful might happen 0 0  Total GAD 7 Score 3 2  Anxiety Difficulty - Not difficult at all    BP Readings from Last 3 Encounters:  01/28/20 124/74  11/02/19 118/70  06/29/19 112/72    Physical Exam Vitals and nursing note reviewed.  Constitutional:      General: She is not in acute distress.    Appearance: She is well-developed.  HENT:  Head: Normocephalic and atraumatic.     Right Ear: Tympanic membrane and ear canal normal.     Left Ear: Tympanic membrane and ear canal normal.     Nose:     Right Sinus: No maxillary sinus tenderness.     Left Sinus: No maxillary sinus tenderness.  Eyes:     General: No scleral icterus.       Right eye: No discharge.        Left eye: No discharge.     Conjunctiva/sclera: Conjunctivae normal.  Neck:     Thyroid: No thyromegaly.     Vascular: No carotid bruit.  Cardiovascular:     Rate and Rhythm: Normal rate and regular rhythm.  No extrasystoles are present.    Pulses: Normal pulses.     Heart sounds: Normal heart sounds. No murmur heard.   Pulmonary:     Effort: Pulmonary effort is normal. No respiratory  distress.     Breath sounds: No wheezing.  Chest:  Breasts:     Right: No mass, nipple discharge, skin change or tenderness.     Left: No mass, nipple discharge, skin change or tenderness.    Abdominal:     General: Bowel sounds are normal.     Palpations: Abdomen is soft.     Tenderness: There is no abdominal tenderness.  Musculoskeletal:     Cervical back: Normal range of motion. No erythema.     Right lower leg: 1+ Pitting Edema present.     Left lower leg: 1+ Pitting Edema present.  Lymphadenopathy:     Cervical: No cervical adenopathy.  Skin:    General: Skin is warm and dry.     Capillary Refill: Capillary refill takes less than 2 seconds.     Findings: No rash.  Neurological:     General: No focal deficit present.     Mental Status: She is alert and oriented to person, place, and time.     Cranial Nerves: No cranial nerve deficit.     Sensory: No sensory deficit.     Deep Tendon Reflexes: Reflexes are normal and symmetric.  Psychiatric:        Attention and Perception: Attention normal.        Mood and Affect: Mood normal.        Behavior: Behavior normal.     Wt Readings from Last 3 Encounters:  01/28/20 272 lb (123.4 kg)  11/02/19 263 lb (119.3 kg)  06/29/19 270 lb (122.5 kg)    BP 124/74   Pulse 90   Temp 97.8 F (36.6 C) (Oral)   Ht 5' 5"  (1.651 m)   Wt 272 lb (123.4 kg)   SpO2 95%   BMI 45.26 kg/m   Assessment and Plan: 1. Annual physical exam Exam is normal except for weight. Encourage regular exercise and appropriate dietary changes. - CBC with Differential/Platelet - Comprehensive metabolic panel - TSH - POCT urinalysis dipstick  2. Encounter for screening mammogram for breast cancer Schedule at [facility] - MM 3D SCREEN BREAST BILATERAL; Future  3. Essential hypertension Clinically stable exam with well controlled BP. Tolerating medications without side effects at this time. Pt to continue current regimen and low sodium diet; benefits of  regular exercise as able discussed.  4. Hyperlipidemia associated with type 2 diabetes mellitus ([place]) Now on higher dose Pravachol 40 mg per day without side effects - Lipid panel - pravastatin (PRAVACHOL) 40 MG tablet; Take 1 tablet (40 mg total) by mouth daily.  Dispense: 90 tablet; Refill: 3  5. Type II diabetes mellitus with complication (HCC) Continue metformin; suspect A1C is higher due to being off of Rybelsus Her insurance covers generic glyburide/actos She will check on coverage of brand name medication - Hemoglobin A1c   Partially dictated using Editor, [place]. Any errors are unintentional.  Halina Maidens, MD [facility]  01/28/2020

## 2020-01-29 ENCOUNTER — Encounter: Payer: Self-pay | Admitting: Internal Medicine

## 2020-01-29 LAB — CBC WITH DIFFERENTIAL/PLATELET
Basophils Absolute: 0 10*3/uL (ref 0.0–0.2)
Basos: 0 %
EOS (ABSOLUTE): 0.2 10*3/uL (ref 0.0–0.4)
Eos: 2 %
Hematocrit: 41.4 % (ref 34.0–46.6)
Hemoglobin: 14.3 g/dL (ref 11.1–15.9)
Immature Grans (Abs): 0 10*3/uL (ref 0.0–0.1)
Immature Granulocytes: 0 %
Lymphocytes Absolute: 2.2 10*3/uL (ref 0.7–3.1)
Lymphs: 24 %
MCH: 30.7 pg (ref 26.6–33.0)
MCHC: 34.5 g/dL (ref 31.5–35.7)
MCV: 89 fL (ref 79–97)
Monocytes Absolute: 0.6 10*3/uL (ref 0.1–0.9)
Monocytes: 6 %
Neutrophils Absolute: 6 10*3/uL (ref 1.4–7.0)
Neutrophils: 68 %
Platelets: 256 10*3/uL (ref 150–450)
RBC: 4.66 x10E6/uL (ref 3.77–5.28)
RDW: 13 % (ref 11.7–15.4)
WBC: 9 10*3/uL (ref 3.4–10.8)

## 2020-01-29 LAB — COMPREHENSIVE METABOLIC PANEL
ALT: 64 IU/L — ABNORMAL HIGH (ref 0–32)
AST: 58 IU/L — ABNORMAL HIGH (ref 0–40)
Albumin/Globulin Ratio: 1.9 (ref 1.2–2.2)
Albumin: 4.5 g/dL (ref 3.8–4.9)
Alkaline Phosphatase: 90 IU/L (ref 44–121)
BUN/Creatinine Ratio: 23 (ref 9–23)
BUN: 20 mg/dL (ref 6–24)
Bilirubin Total: 0.2 mg/dL (ref 0.0–1.2)
CO2: 21 mmol/L (ref 20–29)
Calcium: 10.2 mg/dL (ref 8.7–10.2)
Chloride: 99 mmol/L (ref 96–106)
Creatinine, Ser: 0.86 mg/dL (ref 0.57–1.00)
GFR calc Af Amer: 86 mL/min/{1.73_m2} (ref >59)
GFR calc non Af Amer: 75 mL/min/{1.73_m2} (ref >59)
Globulin, Total: 2.4 g/dL (ref 1.5–4.5)
Glucose: 151 mg/dL — ABNORMAL HIGH (ref 65–99)
Potassium: 4.2 mmol/L (ref 3.5–5.2)
Sodium: 139 mmol/L (ref 134–144)
Total Protein: 6.9 g/dL (ref 6.0–8.5)

## 2020-01-29 LAB — LIPID PANEL
Chol/HDL Ratio: 4.4 ratio (ref 0.0–4.4)
Cholesterol, Total: 170 mg/dL (ref 100–199)
HDL: 39 mg/dL — ABNORMAL LOW (ref >39)
LDL Chol Calc (NIH): 102 mg/dL — ABNORMAL HIGH (ref 0–99)
Triglycerides: 166 mg/dL — ABNORMAL HIGH (ref 0–149)
VLDL Cholesterol Cal: 29 mg/dL (ref 5–40)

## 2020-01-29 LAB — HEMOGLOBIN A1C
Est. average glucose Bld gHb Est-mCnc: 163 mg/dL
Hgb A1c MFr Bld: 7.3 % — ABNORMAL HIGH (ref 4.8–5.6)

## 2020-01-29 LAB — TSH: TSH: 1.35 u[IU]/mL (ref 0.450–4.500)

## 2020-03-04 ENCOUNTER — Telehealth: Payer: Self-pay

## 2020-03-04 NOTE — Telephone Encounter (Signed)
Copied from [place] [id number]. Topic: Quick Communication - Rx Refill/Question >> Mar 04, 2020  3:19 PM Erick Blinks wrote: Pt has a list of medications that need to be approved by PCP per insurance. Please advise, pt will send message via mychart and upload document/list.

## 2020-03-05 NOTE — Telephone Encounter (Signed)
Called pt told her that Rybelsus and Wilder Glade was denied by her insurance. Told pt to call insurance to see what alternatives her insurance will pay for. Pt verbalized understanding and stated she will call insurance.  KP

## 2020-04-06 ENCOUNTER — Other Ambulatory Visit: Payer: Self-pay | Admitting: Internal Medicine

## 2020-04-06 NOTE — Telephone Encounter (Signed)
Requested Prescriptions  Pending Prescriptions Disp Refills  . olmesartan-hydrochlorothiazide (BENICAR HCT) 40-25 MG tablet [Pharmacy Med Name: OLMESARTAN MEDOX/HCTZ 40-25MG  TAB] 90 tablet 0    Sig: TAKE 1 TABLET BY MOUTH DAILY     Cardiovascular: ARB + Diuretic Combos Passed - 04/06/2020  8:04 AM      Passed - K in normal range and within 180 days    Potassium  Date Value Ref Range Status  01/28/2020 4.2 3.5 - 5.2 mmol/L Final         Passed - Na in normal range and within 180 days    Sodium  Date Value Ref Range Status  01/28/2020 139 134 - 144 mmol/L Final         Passed - Cr in normal range and within 180 days    Creatinine, Ser  Date Value Ref Range Status  01/28/2020 0.86 0.57 - 1.00 mg/dL Final         Passed - Ca in normal range and within 180 days    Calcium  Date Value Ref Range Status  01/28/2020 10.2 8.7 - 10.2 mg/dL Final         Passed - Patient is not pregnant      Passed - Last BP in normal range    BP Readings from Last 1 Encounters:  01/28/20 124/74         Passed - Valid encounter within last 6 months    Recent Outpatient Visits          2 months ago Annual physical exam   [facility] Glean Hess, MD   5 months ago DM (diabetes mellitus) with complications [facility])   [facility] Glean [facility], MD   9 months ago Essential hypertension   [facility] Glean Hess, MD   1 year ago Annual physical exam   [facility] Glean Hess, MD   1 year ago DM (diabetes mellitus) with complications [facility])   [place], Laura H, MD      Future Appointments            In 2 months [place] Melia Jesse Sans, MD [facility], [place]   In 10 months Army Melia Jesse Sans, MD [facility], [facility]

## 2020-04-25 ENCOUNTER — Other Ambulatory Visit: Payer: Self-pay

## 2020-04-25 ENCOUNTER — Ambulatory Visit
Admission: RE | Admit: 2020-04-25 | Discharge: 2020-04-25 | Disposition: A | Discharge status: Home / Self Care | Payer: BC Managed Care – PPO | Source: Ambulatory Visit | Attending: Internal Medicine | Admitting: Internal Medicine

## 2020-04-25 DIAGNOSIS — Z1231 Encounter for screening mammogram for malignant neoplasm of breast: Secondary | ICD-10-CM | POA: Diagnosis not present

## 2020-05-17 ENCOUNTER — Other Ambulatory Visit: Payer: Self-pay | Admitting: Internal Medicine

## 2020-05-17 DIAGNOSIS — I10 Essential (primary) hypertension: Secondary | ICD-10-CM

## 2020-05-17 DIAGNOSIS — E118 Type 2 diabetes mellitus with unspecified complications: Secondary | ICD-10-CM

## 2020-05-17 NOTE — Telephone Encounter (Signed)
Requested Prescriptions  Pending Prescriptions Disp Refills  . amLODipine (NORVASC) 5 MG tablet [Pharmacy Med Name: amLODIPine Besylate 5 MG Oral Tablet] 90 tablet 0    Sig: TAKE 1 TABLET BY MOUTH  DAILY     Cardiovascular:  Calcium Channel Blockers Passed - 05/17/2020  6:17 AM      Passed - Last BP in normal range    BP Readings from Last 1 Encounters:  01/28/20 124/74         Passed - Valid encounter within last 6 months    Recent Outpatient Visits          3 months ago Annual physical exam   [facility] Glean Hess, MD   6 months ago DM (diabetes mellitus) with complications [facility])   [facility] Glean Hess, MD   10 months ago Essential hypertension   [facility] Glean Hess, MD   1 year ago Annual physical exam   [facility] Glean Hess, MD   1 year ago DM (diabetes mellitus) with complications [facility])   [facility] Glean Hess, MD      Future Appointments            In 2 weeks Glean Hess, MD [facility], [place]   In 8 months Glean Hess, MD [facility], [facility]           . metFORMIN (GLUCOPHAGE-XR) 500 MG 24 hr tablet [Pharmacy Med Name: metFORMIN HCl ER 500 MG Oral Tablet Extended Release 24 Hour] 180 tablet 0    Sig: TAKE 1 TABLET BY MOUTH  TWICE DAILY BEFORE A MEAL     Endocrinology:  Diabetes - Biguanides Passed - 05/17/2020  6:17 AM      Passed - Cr in normal range and within 360 days    Creatinine, Ser  Date Value Ref Range Status  01/28/2020 0.86 0.57 - 1.00 mg/dL Final         Passed - HBA1C is between 0 and 7.9 and within 180 days    Hgb A1c MFr Bld  Date Value Ref Range Status  01/28/2020 7.3 (H) 4.8 - 5.6 % Final    Comment:             Prediabetes: 5.7 - 6.4          Diabetes: >6.4          Glycemic control for adults with diabetes: <7.0          Passed - AA eGFR in normal range and within 360 days    GFR calc Af Amer  Date Value Ref Range  Status  01/28/2020 86 >59 mL/min/1.73 Final    Comment:    **In accordance with recommendations from the NKF-ASN Task force,**   [facility] is in the process of updating its eGFR calculation to the   2021 CKD-EPI creatinine equation that estimates kidney function   without a race variable.    GFR calc non Af Amer  Date Value Ref Range Status  01/28/2020 75 >59 mL/min/1.73 Final         Passed - Valid encounter within last 6 months    Recent Outpatient Visits          3 months ago Annual physical exam   [facility] Glean Hess, MD   6 months ago DM (diabetes mellitus) with complications [facility])   [facility] Glean Hess, MD   10  months ago Essential hypertension   [facility] Glean Hess, MD   1 year ago Annual physical exam   [facility] Glean Hess, MD   1 year ago DM (diabetes mellitus) with complications [facility])   [facility] Glean Hess, MD      Future Appointments            In 2 weeks Army Melia Jesse Sans, MD [facility], [place]   In 8 months Army Melia, Jesse Sans, MD [facility], [facility]

## 2020-05-30 ENCOUNTER — Ambulatory Visit: Payer: BC Managed Care – PPO | Admitting: Internal Medicine

## 2020-06-05 NOTE — Progress Notes (Signed)
Date:  06/06/2020   Name:  Anita Krueger   DOB:  [birth year]   MRN:  [id number]   Chief Complaint: Diabetes  Diabetes She presents for her follow-up diabetic visit. She has type 2 diabetes mellitus. Her disease course has been improving. Pertinent negatives for hypoglycemia include no headaches or tremors. Pertinent negatives for diabetes include no chest pain, no fatigue, no polydipsia and no polyuria. Current diabetic treatment includes oral agent (monotherapy) and diet. She is compliant with treatment all of the time. An ACE inhibitor/angiotensin II receptor blocker is being taken.  Hyperlipidemia This is a chronic problem. The problem is uncontrolled. Pertinent negatives include no chest pain or shortness of breath. Current antihyperlipidemic treatment includes statins (pravachold with high LDH 102).    Lab Results  Component Value Date   CREATININE 0.86 01/28/2020   BUN 20 01/28/2020   NA 139 01/28/2020   K 4.2 01/28/2020   CL 99 01/28/2020   CO2 21 01/28/2020   Lab Results  Component Value Date   CHOL 170 01/28/2020   HDL 39 (L) 01/28/2020   LDLCALC 102 (H) 01/28/2020   TRIG 166 (H) 01/28/2020   CHOLHDL 4.4 01/28/2020   Lab Results  Component Value Date   TSH 1.350 01/28/2020   Lab Results  Component Value Date   HGBA1C 7.3 (A) 06/06/2020   Lab Results  Component Value Date   WBC 9.0 01/28/2020   HGB 14.3 01/28/2020   HCT 41.4 01/28/2020   MCV 89 01/28/2020   PLT 256 01/28/2020   Lab Results  Component Value Date   ALT 64 (H) 01/28/2020   AST 58 (H) 01/28/2020   ALKPHOS 90 01/28/2020   BILITOT 0.2 01/28/2020     Review of Systems  Constitutional: Negative for appetite change, fatigue, fever and unexpected weight change.  HENT: Negative for tinnitus and trouble swallowing.   Eyes: Negative for visual disturbance.  Respiratory: Negative for cough, chest tightness and shortness of breath.   Cardiovascular: Negative for chest pain, palpitations and leg  swelling.  Gastrointestinal: Negative for abdominal pain.  Endocrine: Negative for polydipsia and polyuria.  Genitourinary: Negative for dysuria and hematuria.  Musculoskeletal: Negative for arthralgias.  Neurological: Negative for tremors, numbness and headaches.  Psychiatric/Behavioral: Negative for dysphoric mood.    Patient Active Problem List   Diagnosis Date Noted  . Tubular adenoma of colon 04/03/2018  . Iron deficiency anemia 01/24/2018  . 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 Menorrhagia with irregular cycle 01/19/2017  . Hip pain, acute, left 05/12/2016  . Essential hypertension 04/04/2015  . Type II diabetes mellitus with complication ([place]) 11/65/7903  . Hyperlipidemia associated with type 2 diabetes mellitus ([place]) 09/29/2014    No Known Allergies  Past Surgical History:  Procedure Laterality Date  . bell's palsy  2016   right eye sluggish  . COLONOSCOPY WITH PROPOFOL N/A 03/31/2018   Procedure: COLONOSCOPY WITH PROPOFOL;  Surgeon: Jonathon Bellows, MD;  Location: [facility] ENDOSCOPY;  Service: Gastroenterology;  Laterality: N/A;  . DILATATION & CURETTAGE/HYSTEROSCOPY WITH MYOSURE N/A 03/31/2017   Procedure: DILATATION & CURETTAGE/HYSTEROSCOPY WITH MYOSURE;  Surgeon: [name], MD;  Location: [facility] ORS;  Service: Gynecology;  Laterality: N/A;  . LAPAROSCOPY      Social History   Tobacco Use  . Smoking status: Former Smoker    Types: Cigarettes  . Smokeless tobacco: Never Used  . Tobacco comment: 40 years ago  Substance Use Topics  . Alcohol use: Yes  Alcohol/week: 2.0 standard drinks    Types: 2 Shots of liquor per week    Comment: occasional  . Drug use: No     Medication list has been reviewed and updated.  Current Meds  Medication Sig  . amLODipine (NORVASC) 5 MG tablet TAKE 1 TABLET BY MOUTH  DAILY  . Blood Glucose Monitoring Suppl (CONTOUR NEXT MONITOR) w/Device KIT USE AS DIRECTED  . Carboxymethylcellul-Glycerin 1-0.25 %  SOLN Place 1 drop into both eyes 3 (three) times daily as needed (dry eyes).  . carboxymethylcellulose (REFRESH PLUS) 0.5 % SOLN Place 1 drop into both eyes 3 (three) times daily as needed (dry eyes).  . Ferrous Sulfate (IRON PO) Take by mouth daily.  Marland Kitchen glucose blood (ONETOUCH ULTRA) test strip USE 1 STRIP UP TO 4 TIMES  DAILY AS DIRECTED  . Lancets (ONETOUCH DELICA PLUS [id number]) MISC 1 each by Does not apply route in the morning and at bedtime.  Marland Kitchen loratadine (CLARITIN) 10 MG tablet Take 10 mg by mouth daily as needed for allergies.  . metFORMIN (GLUCOPHAGE-XR) 500 MG 24 hr tablet TAKE 1 TABLET BY MOUTH  TWICE DAILY BEFORE A MEAL  . Multiple Vitamins-Minerals (DAILY MULTIVITAMIN PO) Take 2 tablets by mouth daily.  . pravastatin (PRAVACHOL) 40 MG tablet Take 1 tablet (40 mg total) by mouth daily.  . [DISCONTINUED] olmesartan-hydrochlorothiazide (BENICAR HCT) 40-25 MG tablet TAKE 1 TABLET BY MOUTH DAILY    PHQ 2/9 Scores 06/06/2020 01/28/2020 06/29/2019 01/26/2019  PHQ - 2 Score 0 0 2 2  PHQ- 9 Score 3 0 8 8    GAD 7 : Generalized Anxiety Score 06/06/2020 01/28/2020 06/29/2019  Nervous, Anxious, on Edge _0 Control/stop worrying 0 1 0  Worry too much - different things 0 1 0  Trouble relaxing 0 0 0  Restless 0 0 1  Easily annoyed or irritable 0 0 0  Afraid - awful might happen 0 0 0  Total GAD 7 Score _1 Anxiety Difficulty Not difficult at all - Not difficult at all    BP Readings from Last 3 Encounters:  06/06/20 118/74  01/28/20 124/74  11/02/19 118/70    Physical Exam Vitals and nursing note reviewed.  Constitutional:      General: She is not in acute distress.    Appearance: Normal appearance. She is well-developed.  HENT:     Head: Normocephalic and atraumatic.  Neck:     Vascular: No carotid bruit.  Cardiovascular:     Rate and Rhythm: Normal rate and regular rhythm.     Pulses: Normal pulses.     Heart sounds: No murmur heard.   Pulmonary:     Effort:  Pulmonary effort is normal. No respiratory distress.     Breath sounds: No wheezing or rhonchi.  Musculoskeletal:     Cervical back: Normal range of motion.     Right lower leg: Edema (trace ankle) present.     Left lower leg: Edema (trace ankle) present.  Lymphadenopathy:     Cervical: No cervical adenopathy.  Skin:    General: Skin is warm and dry.     Findings: Rash (peeling rash on inner feet) present.  Neurological:     Mental Status: She is alert and oriented to person, place, and time.  Psychiatric:        Attention and Perception: Attention normal.        Mood and Affect: Mood normal.     Wt Readings from Last  3 Encounters:  06/06/20 274 lb (124.3 kg)  01/28/20 272 lb (123.4 kg)  11/02/19 263 lb (119.3 kg)    BP 118/74   Pulse 69   Temp 97.9 F (36.6 C) (Oral)   Ht _0  (1.651 m)   Wt 274 lb (124.3 kg)   LMP 05/22/2018 (Exact Date)   SpO2 96%   BMI 45.60 kg/m   Assessment and Plan: 1. Type II diabetes mellitus with complication (HCC) Clinically stable by exam and report without s/s of hypoglycemia. DM complicated by HTN and lipids. Tolerating medications - metformin -  well without side effects or other concerns. - POCT glycosylated hemoglobin (Hb A1C)  2. Hyperlipidemia associated with type 2 diabetes mellitus ([place]) Now on Pravachol 40 mg without side effects  3. Essential hypertension Clinically stable exam with well controlled BP. Tolerating medications without side effects at this time. Pt to continue current regimen and low sodium diet; benefits of regular exercise as able discussed.   Partially dictated using Editor, commissioning. Any errors are unintentional.  Halina Maidens, MD [facility]  06/06/2020

## 2020-06-06 ENCOUNTER — Ambulatory Visit (INDEPENDENT_AMBULATORY_CARE_PROVIDER_SITE_OTHER): Payer: BC Managed Care – PPO | Admitting: Internal Medicine

## 2020-06-06 ENCOUNTER — Encounter: Payer: Self-pay | Admitting: Internal Medicine

## 2020-06-06 ENCOUNTER — Other Ambulatory Visit: Payer: Self-pay

## 2020-06-06 VITALS — BP 118/74 | HR 69 | Temp 97.9°F | Ht 65.0 in | Wt 274.0 lb

## 2020-06-06 DIAGNOSIS — E785 Hyperlipidemia, unspecified: Secondary | ICD-10-CM | POA: Diagnosis not present

## 2020-06-06 DIAGNOSIS — E1169 Type 2 diabetes mellitus with other specified complication: Secondary | ICD-10-CM | POA: Diagnosis not present

## 2020-06-06 DIAGNOSIS — E118 Type 2 diabetes mellitus with unspecified complications: Secondary | ICD-10-CM | POA: Diagnosis not present

## 2020-06-06 DIAGNOSIS — I10 Essential (primary) hypertension: Secondary | ICD-10-CM

## 2020-06-06 LAB — POCT GLYCOSYLATED HEMOGLOBIN (HGB A1C): Hemoglobin A1C: 7.3 % — AB (ref 4.0–5.6)

## 2020-06-06 MED ORDER — OLMESARTAN MEDOXOMIL-HCTZ 40-25 MG PO TABS: 1.0000 | ORAL_TABLET | Freq: Every day | ORAL | 3 refills | Status: DC

## 2020-08-20 ENCOUNTER — Other Ambulatory Visit: Payer: Self-pay | Admitting: Internal Medicine

## 2020-08-20 DIAGNOSIS — E118 Type 2 diabetes mellitus with unspecified complications: Secondary | ICD-10-CM

## 2020-08-20 DIAGNOSIS — I10 Essential (primary) hypertension: Secondary | ICD-10-CM

## 2020-10-10 ENCOUNTER — Ambulatory Visit: Payer: BC Managed Care – PPO | Admitting: Internal Medicine

## 2020-11-07 ENCOUNTER — Other Ambulatory Visit: Payer: Self-pay

## 2020-11-07 ENCOUNTER — Ambulatory Visit (INDEPENDENT_AMBULATORY_CARE_PROVIDER_SITE_OTHER): Payer: BC Managed Care – PPO | Admitting: Internal Medicine

## 2020-11-07 ENCOUNTER — Encounter: Payer: Self-pay | Admitting: Internal Medicine

## 2020-11-07 VITALS — BP 136/78 | HR 112 | Ht 65.0 in | Wt 268.6 lb

## 2020-11-07 DIAGNOSIS — E118 Type 2 diabetes mellitus with unspecified complications: Secondary | ICD-10-CM | POA: Diagnosis not present

## 2020-11-07 DIAGNOSIS — E1169 Type 2 diabetes mellitus with other specified complication: Secondary | ICD-10-CM | POA: Diagnosis not present

## 2020-11-07 DIAGNOSIS — I10 Essential (primary) hypertension: Secondary | ICD-10-CM

## 2020-11-07 DIAGNOSIS — Z23 Encounter for immunization: Secondary | ICD-10-CM | POA: Diagnosis not present

## 2020-11-07 DIAGNOSIS — M7651 Patellar tendinitis, right knee: Secondary | ICD-10-CM | POA: Diagnosis not present

## 2020-11-07 DIAGNOSIS — E785 Hyperlipidemia, unspecified: Secondary | ICD-10-CM

## 2020-11-07 LAB — POCT GLYCOSYLATED HEMOGLOBIN (HGB A1C): Hemoglobin A1C: 6.7 % — AB (ref 4.0–5.6)

## 2020-11-07 NOTE — Patient Instructions (Signed)
Use ice on the knee  Take Tylenol [phone] mg twice a day as needed  Use topical rubs as needed as well.

## 2020-11-07 NOTE — Progress Notes (Signed)
Date:  11/07/2020   Name:  Anita Krueger   DOB:  20-Dec-1960   MRN:  [id number]   Chief Complaint: Diabetes and Hypertension  Diabetes She presents for her follow-up diabetic visit. She has type 2 diabetes mellitus. Pertinent negatives for hypoglycemia include no headaches or tremors. Pertinent negatives for diabetes include no chest pain, no fatigue, no polydipsia and no polyuria. Symptoms are stable. Current diabetic treatment includes oral agent (monotherapy) (metformin). She is compliant with treatment all of the time. Her breakfast blood glucose is taken between 6-7 am. Her breakfast blood glucose range is generally 140-180 mg/dl. An ACE inhibitor/angiotensin II receptor blocker is being taken. Eye exam is current.  Hypertension This is a chronic problem. The current episode started more than 1 year ago. The problem has been waxing and waning since onset. Pertinent negatives include no chest pain, headaches, palpitations or shortness of breath. Past treatments include angiotensin blockers.  Leg Pain  The incident occurred more than 1 week ago. There was no injury mechanism. The pain is present in the right thigh and right knee. The quality of the pain is described as aching. The pain is moderate. The pain has been Intermittent since onset. Associated symptoms include a loss of motion and tingling. Pertinent negatives include no numbness. The symptoms are aggravated by weight bearing.   FINDINGS:  Left Hip/Pelvis 2018 The bones are subjectively adequately mineralized. The bony pelvis exhibits no acute abnormalities. AP and lateral views of the left hip reveal very mild symmetric narrowing of the joint space. The articular surfaces of the femoral head and acetabulum remains smoothly rounded. The femoral neck, intertrochanteric, and subtrochanteric regions are normal.   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 No acute bony abnormality of  the left hip is observed.     Electronically Signed   By: David  Martinique M.D.   On: 05/04/2016 11:46 Lab Results  Component Value Date   CREATININE 0.86 01/28/2020   BUN 20 01/28/2020   NA 139 01/28/2020   K 4.2 01/28/2020   CL 99 01/28/2020   CO2 21 01/28/2020   Lab Results  Component Value Date   CHOL 170 01/28/2020   HDL 39 (L) 01/28/2020   LDLCALC 102 (H) 01/28/2020   TRIG 166 (H) 01/28/2020   CHOLHDL 4.4 01/28/2020   Lab Results  Component Value Date   TSH 1.350 01/28/2020   Lab Results  Component Value Date   HGBA1C 7.3 (A) 06/06/2020   Lab Results  Component Value Date   WBC 9.0 01/28/2020   HGB 14.3 01/28/2020   HCT 41.4 01/28/2020   MCV 89 01/28/2020   PLT 256 01/28/2020   Lab Results  Component Value Date   ALT 64 (H) 01/28/2020   AST 58 (H) 01/28/2020   ALKPHOS 90 01/28/2020   BILITOT 0.2 01/28/2020     Review of Systems  Constitutional:  Negative for appetite change, fatigue, fever and unexpected weight change.  HENT:  Negative for tinnitus and trouble swallowing.   Eyes:  Negative for visual disturbance.  Respiratory:  Negative for cough, chest tightness and shortness of breath.   Cardiovascular:  Negative for chest pain, palpitations and leg swelling.  Gastrointestinal:  Negative for abdominal pain.  Endocrine: Negative for polydipsia and polyuria.  Genitourinary:  Negative for dysuria and hematuria.  Musculoskeletal:  Positive for arthralgias (right knee). Negative for gait problem and joint swelling.  Neurological:  Positive for tingling.  Negative for tremors, numbness and headaches.  Psychiatric/Behavioral:  Negative for dysphoric mood and sleep disturbance.    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   Menorrhagia with irregular cycle 01/19/2017   Hip pain, acute, left 05/12/2016   Essential  hypertension 04/04/2015   Type II diabetes mellitus with complication ([place]) 14/48/1856   Hyperlipidemia associated with type 2 diabetes mellitus ([place]) 09/29/2014    No Known Allergies  Past Surgical History:  Procedure Laterality Date   bell's palsy  2016   right eye sluggish   COLONOSCOPY WITH PROPOFOL N/A 03/31/2018   Procedure: COLONOSCOPY WITH PROPOFOL;  Surgeon: Jonathon Bellows, MD;  Location: [facility] ENDOSCOPY;  Service: Gastroenterology;  Laterality: N/A;   DILATATION & CURETTAGE/HYSTEROSCOPY WITH MYOSURE N/A 03/31/2017   Procedure: DILATATION & CURETTAGE/HYSTEROSCOPY WITH MYOSURE;  Surgeon: [name], MD;  Location: [facility] ORS;  Service: Gynecology;  Laterality: N/A;   LAPAROSCOPY      Social History   Tobacco Use   Smoking status: Former    Types: Cigarettes   Smokeless tobacco: Never   Tobacco comments:    40 years ago  Substance Use Topics   Alcohol use: Yes    Alcohol/week: 2.0 standard drinks    Types: 2 Shots of liquor per week    Comment: occasional   Drug use: No     Medication list has been reviewed and updated.  Current Meds  Medication Sig   amLODipine (NORVASC) 5 MG tablet TAKE 1 TABLET BY MOUTH  DAILY   Blood Glucose Monitoring Suppl (CONTOUR NEXT MONITOR) w/Device KIT USE AS DIRECTED   Carboxymethylcellul-Glycerin 1-0.25 % SOLN Place 1 drop into both eyes 3 (three) times daily as needed (dry eyes).   carboxymethylcellulose (REFRESH PLUS) 0.5 % SOLN Place 1 drop into both eyes 3 (three) times daily as needed (dry eyes).   Ferrous Sulfate (IRON PO) Take by mouth daily.   glucose blood (ONETOUCH ULTRA) test strip USE 1 STRIP UP TO 4 TIMES  DAILY AS DIRECTED   Lancets (ONETOUCH DELICA PLUS [id number]) MISC 1 each by Does not apply route in the morning and at bedtime.   loratadine (CLARITIN) 10 MG tablet Take 10 mg by mouth daily as needed for allergies.   metFORMIN (GLUCOPHAGE-XR) 500 MG 24 hr tablet TAKE 1 TABLET BY MOUTH  TWICE DAILY BEFORE A MEAL    Multiple Vitamins-Minerals (DAILY MULTIVITAMIN PO) Take 2 tablets by mouth daily.   olmesartan-hydrochlorothiazide (BENICAR HCT) 40-25 MG tablet Take 1 tablet by mouth daily.   pravastatin (PRAVACHOL) 40 MG tablet Take 1 tablet (40 mg total) by mouth daily.    PHQ 2/9 Scores 11/07/2020 06/06/2020 01/28/2020 06/29/2019  PHQ - 2 Score 0 0 0 2  PHQ- 9 Score 3 3 0 8    GAD 7 : Generalized Anxiety Score 11/07/2020 06/06/2020 01/28/2020 06/29/2019  Nervous, Anxious, on Edge 1 1 1 1   Control/stop worrying 1 0 1 0  Worry too much - different things 0 0 1 0  Trouble relaxing 0 0 0 0  Restless 0 0 0 1  Easily annoyed or irritable 0 0 0 0  Afraid - awful might happen 0 0 0 0  Total GAD 7 Score 2 1 3 2   Anxiety Difficulty Not difficult at all Not difficult at all - Not difficult at all    BP Readings from Last  3 Encounters:  11/07/20 136/78  06/06/20 118/74  01/28/20 124/74    Physical Exam Vitals and nursing note reviewed.  Constitutional:      General: She is not in acute distress.    Appearance: She is well-developed. She is obese.  HENT:     Head: Normocephalic and atraumatic.  Cardiovascular:     Rate and Rhythm: Normal rate and regular rhythm.     Pulses: Normal pulses.     Heart sounds: No murmur heard. Pulmonary:     Effort: Pulmonary effort is normal. No respiratory distress.     Breath sounds: No wheezing or rhonchi.  Musculoskeletal:     Cervical back: Normal range of motion.     Right knee: No swelling or deformity. Tenderness present over the MCL and patellar tendon.     Instability Tests: Anterior drawer test negative.     Right lower leg: No edema.     Left lower leg: No edema.  Lymphadenopathy:     Cervical: No cervical adenopathy.  Skin:    General: Skin is warm and dry.     Findings: No rash.  Neurological:     Mental Status: She is alert and oriented to person, place, and time.  Psychiatric:        Mood and Affect: Mood normal.        Behavior: Behavior  normal.    Wt Readings from Last 3 Encounters:  11/07/20 268 lb 9.6 oz (121.8 kg)  06/06/20 274 lb (124.3 kg)  01/28/20 272 lb (123.4 kg)    BP 136/78   Pulse (!) 112   Ht 5' 5"  (1.651 m)   Wt 268 lb 9.6 oz (121.8 kg)   LMP 05/22/2018 (Exact Date)   SpO2 96%   BMI 44.70 kg/m   Assessment and Plan: 1. Type II diabetes mellitus with complication (HCC) Clinically stable by exam and report without s/s of hypoglycemia. DM complicated by hypertension and dyslipidemia. Tolerating medications well without side effects or other concerns. - POCT glycosylated hemoglobin (Hb A1C) = 6.7 down from 7.3  2. Hyperlipidemia associated with type 2 diabetes mellitus ([place]) Doing well on Pravachol  3. Essential hypertension Clinically stable exam with well controlled BP. Tolerating medications without side effects at this time. Pt to continue current regimen and low sodium diet; benefits of regular exercise as able discussed.  4. Patellar tendonitis of right knee Continue topical rubs; ice after activity Tylenol as needed for discomfort If worsening, call for Orthopedic or SM referral   Partially dictated using Dragon software. Any errors are unintentional.  Halina Maidens, MD [facility]  11/07/2020

## 2020-12-30 ENCOUNTER — Other Ambulatory Visit: Payer: Self-pay | Admitting: Internal Medicine

## 2020-12-30 DIAGNOSIS — E1169 Type 2 diabetes mellitus with other specified complication: Secondary | ICD-10-CM

## 2020-12-30 NOTE — Telephone Encounter (Signed)
Requested Prescriptions  Refused Prescriptions Disp Refills  . pravastatin (PRAVACHOL) 40 MG tablet [Pharmacy Med Name: Pravastatin Sodium 40 MG Oral Tablet] 90 tablet 3    Sig: TAKE 1 TABLET BY MOUTH  DAILY     Cardiovascular:  Antilipid - Statins Failed - 12/30/2020 11:25 AM      Failed - LDL in normal range and within 360 days    LDL Chol Calc (NIH)  Date Value Ref Range Status  01/28/2020 102 (H) 0 - 99 mg/dL Final         Failed - HDL in normal range and within 360 days    HDL  Date Value Ref Range Status  01/28/2020 39 (L) >39 mg/dL Final         Failed - Triglycerides in normal range and within 360 days    Triglycerides  Date Value Ref Range Status  01/28/2020 166 (H) 0 - 149 mg/dL Final         Passed - Total Cholesterol in normal range and within 360 days    Cholesterol, Total  Date Value Ref Range Status  01/28/2020 170 100 - 199 mg/dL Final         Passed - Patient is not pregnant      Passed - Valid encounter within last 12 months    Recent Outpatient Visits          1 month ago Hyperlipidemia associated with type 2 diabetes mellitus [facility])   [facility] Glean Hess, MD   6 months ago Type II diabetes mellitus with complication [facility])   [facility] Glean Hess, MD   11 months ago Annual physical exam   [facility] Glean Hess, MD   1 year ago DM (diabetes mellitus) with complications [facility])   [facility] Glean Hess, MD   1 year ago Essential hypertension   [facility] Glean Hess, MD      Future Appointments            In 1 month Army Melia, Jesse Sans, MD [facility], [facility]

## 2020-12-31 NOTE — Telephone Encounter (Signed)
Future OV 02/04/21.  Approved per protocol for 90 day supply at this time-Mail order pharmacy Requested Prescriptions  Pending Prescriptions Disp Refills  . pravastatin (PRAVACHOL) 40 MG tablet [Pharmacy Med Name: Pravastatin Sodium 40 MG Oral Tablet] 90 tablet 0    Sig: TAKE 1 TABLET BY MOUTH  DAILY     Cardiovascular:  Antilipid - Statins Failed - 12/30/2020  3:48 PM      Failed - LDL in normal range and within 360 days    LDL Chol Calc (NIH)  Date Value Ref Range Status  01/28/2020 102 (H) 0 - 99 mg/dL Final         Failed - HDL in normal range and within 360 days    HDL  Date Value Ref Range Status  01/28/2020 39 (L) >39 mg/dL Final         Failed - Triglycerides in normal range and within 360 days    Triglycerides  Date Value Ref Range Status  01/28/2020 166 (H) 0 - 149 mg/dL Final         Passed - Total Cholesterol in normal range and within 360 days    Cholesterol, Total  Date Value Ref Range Status  01/28/2020 170 100 - 199 mg/dL Final         Passed - Patient is not pregnant      Passed - Valid encounter within last 12 months    Recent Outpatient Visits          1 month ago Hyperlipidemia associated with type 2 diabetes mellitus [facility])   [facility] Glean Hess, MD   6 months ago Type II diabetes mellitus with complication [facility]   [facility] Glean Hess, MD   11 months ago Annual physical exam   [facility] Glean Hess, MD   1 year ago DM (diabetes mellitus) with complications [facility])   [facility] Glean [facility], MD   1 year ago Essential hypertension   [facility] Glean Hess, MD      Future Appointments            In 1 month Army Melia, Jesse Sans, MD [facility], [place]

## 2021-01-12 ENCOUNTER — Other Ambulatory Visit: Payer: Self-pay | Admitting: Internal Medicine

## 2021-01-12 MED ORDER — OLMESARTAN MEDOXOMIL-HCTZ 40-25 MG PO TABS: 1.0000 | ORAL_TABLET | Freq: Every day | ORAL | 1 refills | Status: DC

## 2021-01-12 NOTE — Telephone Encounter (Signed)
Requested Prescriptions  Pending Prescriptions Disp Refills  . olmesartan-hydrochlorothiazide (BENICAR HCT) 40-25 MG tablet 90 tablet 3    Sig: Take 1 tablet by mouth daily.     Cardiovascular: ARB + Diuretic Combos Failed - 01/12/2021  1:12 PM      Failed - K in normal range and within 180 days    Potassium  Date Value Ref Range Status  01/28/2020 4.2 3.5 - 5.2 mmol/L Final         Failed - Na in normal range and within 180 days    Sodium  Date Value Ref Range Status  01/28/2020 139 134 - 144 mmol/L Final         Failed - Cr in normal range and within 180 days    Creatinine, Ser  Date Value Ref Range Status  01/28/2020 0.86 0.57 - 1.00 mg/dL Final         Failed - Ca in normal range and within 180 days    Calcium  Date Value Ref Range Status  01/28/2020 10.2 8.7 - 10.2 mg/dL Final         Passed - Patient is not pregnant      Passed - Last BP in normal range    BP Readings from Last 1 Encounters:  11/07/20 136/78         Passed - Valid encounter within last 6 months    Recent Outpatient Visits          2 months ago Hyperlipidemia associated with type 2 diabetes mellitus [facility])   [facility] Glean Hess, MD   7 months ago Type II diabetes mellitus with complication [facility])   [facility] Glean Hess, MD   11 months ago Annual physical exam   [facility] Glean Hess, MD   1 year ago DM (diabetes mellitus) with complications [facility])   [facility] Glean Hess, MD   1 year ago Essential hypertension   [facility] Glean Hess, MD      Future Appointments            In 3 weeks Army Melia Jesse Sans, MD [facility], [facility]

## 2021-01-12 NOTE — Telephone Encounter (Signed)
Medication Refill - Medication: olmesartan-hydrochlorothiazide (BENICAR HCT) 40-25 MG tablet  Has the patient contacted their pharmacy? No. Pt wants her refills transferred to her mail order. Pt needs the refill to go in now for refill Remaining refills at [employer] (with phone number or street name): [facility] - [place], [place]  Has the patient been seen for an appointment in the last year OR does the patient have an upcoming appointment? Yes.    Agent: Please be advised that RX refills may take up to 3 business days. We ask that you follow-up with your pharmacy.

## 2021-01-30 ENCOUNTER — Encounter: Payer: BC Managed Care – PPO | Admitting: Internal Medicine

## 2021-02-04 ENCOUNTER — Encounter: Payer: BC Managed Care – PPO | Admitting: Internal Medicine

## 2021-03-05 ENCOUNTER — Other Ambulatory Visit: Payer: Self-pay | Admitting: Internal Medicine

## 2021-03-05 DIAGNOSIS — E1169 Type 2 diabetes mellitus with other specified complication: Secondary | ICD-10-CM

## 2021-03-05 NOTE — Telephone Encounter (Signed)
Requested Prescriptions  Pending Prescriptions Disp Refills   pravastatin (PRAVACHOL) 40 MG tablet [Pharmacy Med Name: Pravastatin Sodium 40 MG Oral Tablet] 90 tablet 0    Sig: TAKE 1 TABLET BY MOUTH DAILY     Cardiovascular:  Antilipid - Statins Failed - 03/05/2021  4:41 AM      Failed - Total Cholesterol in normal range and within 360 days    Cholesterol, Total  Date Value Ref Range Status  01/28/2020 170 100 - 199 mg/dL Final         Failed - LDL in normal range and within 360 days    LDL Chol Calc (NIH)  Date Value Ref Range Status  01/28/2020 102 (H) 0 - 99 mg/dL Final         Failed - HDL in normal range and within 360 days    HDL  Date Value Ref Range Status  01/28/2020 39 (L) >39 mg/dL Final         Failed - Triglycerides in normal range and within 360 days    Triglycerides  Date Value Ref Range Status  01/28/2020 166 (H) 0 - 149 mg/dL Final         Passed - Patient is not pregnant      Passed - Valid encounter within last 12 months    Recent Outpatient Visits          3 months ago Hyperlipidemia associated with type 2 diabetes mellitus [facility])   [facility] Glean Hess, MD   9 months ago Type II diabetes mellitus with complication [facility])   [facility] Glean Hess, MD   1 year ago Annual physical exam   [facility] Glean Hess, MD   1 year ago DM (diabetes mellitus) with complications [facility])   [place], Laura H, MD   1 year ago Essential hypertension   [facility] Glean Hess, MD      Future Appointments            In 1 week Army Melia Jesse Sans, MD [facility], [facility]

## 2021-03-13 ENCOUNTER — Other Ambulatory Visit: Payer: Self-pay

## 2021-03-13 ENCOUNTER — Ambulatory Visit (INDEPENDENT_AMBULATORY_CARE_PROVIDER_SITE_OTHER): Payer: BC Managed Care – PPO | Admitting: Internal Medicine

## 2021-03-13 ENCOUNTER — Encounter: Payer: Self-pay | Admitting: Internal Medicine

## 2021-03-13 VITALS — BP 126/78 | HR 89 | Ht 65.0 in | Wt 273.0 lb

## 2021-03-13 DIAGNOSIS — K629 Disease of anus and rectum, unspecified: Secondary | ICD-10-CM

## 2021-03-13 DIAGNOSIS — E785 Hyperlipidemia, unspecified: Secondary | ICD-10-CM | POA: Diagnosis not present

## 2021-03-13 DIAGNOSIS — Z1231 Encounter for screening mammogram for malignant neoplasm of breast: Secondary | ICD-10-CM | POA: Diagnosis not present

## 2021-03-13 DIAGNOSIS — E1169 Type 2 diabetes mellitus with other specified complication: Secondary | ICD-10-CM

## 2021-03-13 DIAGNOSIS — I10 Essential (primary) hypertension: Secondary | ICD-10-CM | POA: Diagnosis not present

## 2021-03-13 DIAGNOSIS — E118 Type 2 diabetes mellitus with unspecified complications: Secondary | ICD-10-CM

## 2021-03-13 DIAGNOSIS — Z0000 Encounter for general adult medical examination without abnormal findings: Secondary | ICD-10-CM

## 2021-03-13 LAB — POCT URINALYSIS DIPSTICK
Bilirubin, UA: NEGATIVE
Blood, UA: NEGATIVE
Glucose, UA: NEGATIVE
Ketones, UA: NEGATIVE
Leukocytes, UA: NEGATIVE
Nitrite, UA: NEGATIVE
Protein, UA: NEGATIVE
Spec Grav, UA: 1.02 (ref 1.010–1.025)
Urobilinogen, UA: 0.2 E.U./dL
pH, UA: 6 (ref 5.0–8.0)

## 2021-03-13 NOTE — Progress Notes (Signed)
Date:  03/13/2021   Name:  Anita Krueger   DOB:  Mar 03, 1960   MRN:  [id number]   Chief Complaint: Annual Exam Anita Krueger is a 61 y.o. female who presents today for her Complete Annual Exam. She feels well. She reports exercising none. She reports she is sleeping well. Breast complaints swelling both breast sometimes.  Mammogram: 04/2020 DEXA: none Pap smear: 01/2017 neg with co-testing Colonoscopy: 03/2018 TA repeat 2025; also large pedunculated rectal lesion referred to Gen Surg but pt declined referral.  Immunization History  Administered Date(s) Administered   Influenza,inj,Quad PF,6+ Mos 01/14/2017, 12/15/2017, 12/29/2018, 11/02/2019, 11/07/2020   Influenza-Unspecified 12/10/2014, 12/09/2017   PFIZER(Purple Top)SARS-COV-2 Vaccination 05/08/2019, 05/29/2019   Pneumococcal Polysaccharide-23 09/23/2017    Hypertension This is a chronic problem. The problem is controlled. Pertinent negatives include no chest pain, headaches, palpitations or shortness of breath. Past treatments include angiotensin blockers, calcium channel blockers and diuretics. The current treatment provides significant improvement. There are no compliance problems.  There is no history of kidney disease, CAD/MI or CVA.  Diabetes She presents for her follow-up diabetic visit. She has type 2 diabetes mellitus. Pertinent negatives for hypoglycemia include no dizziness, headaches, nervousness/anxiousness or tremors. Pertinent negatives for diabetes include no chest pain, no fatigue, no polydipsia and no polyuria. Pertinent negatives for diabetic complications include no CVA. Current diabetic treatment includes oral agent (monotherapy) (metformin). An ACE inhibitor/angiotensin II receptor blocker is being taken. She does not see a podiatrist.Eye exam is not current.  Hyperlipidemia This is a chronic problem. The problem is controlled. Pertinent negatives include no chest pain or shortness of breath. Current  antihyperlipidemic treatment includes statins. The current treatment provides significant improvement of lipids.   Lab Results  Component Value Date   NA 139 01/28/2020   K 4.2 01/28/2020   CO2 21 01/28/2020   GLUCOSE 151 (H) 01/28/2020   BUN 20 01/28/2020   CREATININE 0.86 01/28/2020   CALCIUM 10.2 01/28/2020   GFRNONAA 75 01/28/2020   Lab Results  Component Value Date   CHOL 170 01/28/2020   HDL 39 (L) 01/28/2020   LDLCALC 102 (H) 01/28/2020   TRIG 166 (H) 01/28/2020   CHOLHDL 4.4 01/28/2020   Lab Results  Component Value Date   TSH 1.350 01/28/2020   Lab Results  Component Value Date   HGBA1C 6.7 (A) 11/07/2020   Lab Results  Component Value Date   WBC 9.0 01/28/2020   HGB 14.3 01/28/2020   HCT 41.4 01/28/2020   MCV 89 01/28/2020   PLT 256 01/28/2020   Lab Results  Component Value Date   ALT 64 (H) 01/28/2020   AST 58 (H) 01/28/2020   ALKPHOS 90 01/28/2020   BILITOT 0.2 01/28/2020   No results found for: 25OHVITD2, 25OHVITD3, VD25OH   Review of Systems  Constitutional:  Negative for chills, fatigue and fever.  HENT:  Negative for congestion, hearing loss, tinnitus, trouble swallowing and voice change.   Eyes:  Negative for visual disturbance.  Respiratory:  Negative for cough, chest tightness, shortness of breath and wheezing.   Cardiovascular:  Negative for chest pain, palpitations and leg swelling.  Gastrointestinal:  Negative for abdominal pain, constipation, diarrhea and vomiting.  Endocrine: Negative for polydipsia and polyuria.  Genitourinary:  Negative for dysuria, frequency, genital sores, vaginal bleeding and vaginal discharge.  Musculoskeletal:  Negative for arthralgias, gait problem and joint swelling.  Skin:  Negative for color change and rash.  Neurological:  Negative for dizziness, tremors, light-headedness and  headaches.  Hematological:  Negative for adenopathy. Does not bruise/bleed easily.  Psychiatric/Behavioral:  Negative for dysphoric  mood and sleep disturbance. The patient is not nervous/anxious.    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) 09/29/2014    No Known Allergies  Past Surgical History:  Procedure Laterality Date   bell's palsy  2016   right eye sluggish   COLONOSCOPY WITH PROPOFOL N/A 03/31/2018   Procedure: COLONOSCOPY WITH PROPOFOL;  Surgeon: Jonathon Bellows, MD;  Location: [facility] END[facility]COPY;  Service: Gastroenterology;  Laterality: N/A;   DILATATION & CURETTAGE/HYSTEROSCOPY WITH MYOSURE N/A 03/31/2017   Procedure: DILATATION & CURETTAGE/HYSTEROSCOPY WITH MYOSURE;  Surgeon: [name], MD;  Location: [facility] ORS;  Service: Gynecology;  Laterality: N/A;   LAPAROSCOPY      Social History   Tobacco Use   Smoking status: Former    Types: Cigarettes   Smokeless tobacco: Never   Tobacco comments:    40 years ago  Substance Use Topics   Alcohol use: Yes    Alcohol/week: 2.0 standard drinks    Types: 2 Shots of liquor per week    Comment: occasional   Drug use: No     Medication list has been reviewed and updated.  Current Meds  Medication Sig   Acetaminophen (TYLENOL PO) Take by mouth as needed.   amLODipine (NORVASC) 5 MG tablet TAKE 1 TABLET BY MOUTH  DAILY   Blood Glucose Monitoring Suppl (CONTOUR NEXT MONITOR) w/Device KIT USE AS DIRECTED   Carboxymethylcellul-Glycerin 1-0.25 % SOLN Place 1 drop into both eyes 3 (three) times daily as needed (dry eyes).   carboxymethylcellulose (REFRESH PLUS) 0.5 % SOLN Place 1 drop into both eyes 3 (three) times daily as needed  (dry eyes).   Ferrous Sulfate (IRON PO) Take by mouth daily.   glucose blood (ONETOUCH ULTRA) test strip USE 1 STRIP UP TO 4 TIMES  DAILY AS DIRECTED   Lancets (ONETOUCH DELICA PLUS [id number]) MISC 1 each by Does not apply route in the morning and at bedtime.   loratadine (CLARITIN) 10 MG tablet Take 10 mg by mouth daily as needed for allergies.   metFORMIN (GLUCOPHAGE-XR) 500 MG 24 hr tablet TAKE 1 TABLET BY MOUTH  TWICE DAILY BEFORE A MEAL   Multiple Vitamins-Minerals (DAILY MULTIVITAMIN PO) Take 2 tablets by mouth daily.   olmesartan-hydrochlorothiazide (BENICAR HCT) 40-25 MG tablet Take 1 tablet by mouth daily.   pravastatin (PRAVACHOL) 40 MG tablet TAKE 1 TABLET BY MOUTH DAILY   UNABLE TO FIND as needed. Med Name: biofreeze    [facility] 2/9 Scores 03/13/2021 11/07/2020 06/06/2020 01/28/2020  PHQ - 2 Score 2 0 0 0  PHQ- 9 Score 7 3 3  0    GAD 7 : Generalized Anxiety Score 03/13/2021 11/07/2020 06/06/2020 01/28/2020  Nervous, Anxious, on Edge 1 1 1 1   Control/stop worrying 1 1 0 1  Worry too much - different things 0 0 0 1  Trouble relaxing 0 0 0 0  Restless 0 0 0 0  Easily annoyed or irritable 0 0 0 0  Afraid - awful might happen 0  0 0 0  Total GAD 7 Score 2 2 1 3   Anxiety Difficulty Not difficult at all Not difficult at all Not difficult at all -    BP Readings from Last 3 Encounters:  03/13/21 126/78  11/07/20 136/78  06/06/20 118/74    Physical Exam Vitals and nursing note reviewed.  Constitutional:      General: She is not in acute distress.    Appearance: She is well-developed.  HENT:     Head: Normocephalic and atraumatic.     Right Ear: Tympanic membrane and ear canal normal.     Left Ear: Tympanic membrane and ear canal normal.     Nose:     Right Sinus: No maxillary sinus tenderness.     Left Sinus: No maxillary sinus tenderness.  Eyes:     General: No scleral icterus.       Right eye: No discharge.        Left eye: No discharge.     Conjunctiva/sclera: Conjunctivae  normal.  Neck:     Thyroid: No thyromegaly.     Vascular: No carotid bruit.  Cardiovascular:     Rate and Rhythm: Normal rate and regular rhythm.     Pulses: Normal pulses.     Heart sounds: Normal heart sounds.  Pulmonary:     Effort: Pulmonary effort is normal. No respiratory distress.     Breath sounds: No wheezing.  Chest:  Breasts:    Right: No mass, nipple discharge, skin change or tenderness.     Left: No mass, nipple discharge, skin change or tenderness.  Abdominal:     General: Bowel sounds are normal.     Palpations: Abdomen is soft.     Tenderness: There is no abdominal tenderness.  Musculoskeletal:     Cervical back: Normal range of motion. No erythema.     Right lower leg: No edema.     Left lower leg: No edema.  Lymphadenopathy:     Cervical: No cervical adenopathy.  Skin:    General: Skin is warm and dry.     Findings: No rash.  Neurological:     Mental Status: She is alert and oriented to person, place, and time.     Cranial Nerves: No cranial nerve deficit.     Sensory: No sensory deficit.     Deep Tendon Reflexes: Reflexes are normal and symmetric.  Psychiatric:        Attention and Perception: Attention normal.        Mood and Affect: Mood normal.    Wt Readings from Last 3 Encounters:  03/13/21 273 lb (123.8 kg)  11/07/20 268 lb 9.6 oz (121.8 kg)  06/06/20 274 lb (124.3 kg)    BP 126/78    Pulse 89    Ht 5' 5"  (1.651 m)    Wt 273 lb (123.8 kg)    LMP 05/22/2018 (Exact Date)    SpO2 98%    BMI 45.43 kg/m   Assessment and Plan: 1. Annual physical exam Exam is normal except for weight. Encourage regular exercise and appropriate dietary changes. Up to date on screenings and immunizations.  2. Encounter for screening mammogram for breast cancer Schedule in March at [place]  3. Essential hypertension Clinically stable exam with well controlled BP. Tolerating medications without side effects at this time. Pt to  continue current regimen and low sodium diet; benefits of regular exercise as able discussed. - CBC with Differential/Platelet - POCT  urinalysis dipstick - TSH  4. Hyperlipidemia associated with type 2 diabetes mellitus ([place]) Fair control on statins.  If LDL is > 70, will need to consider stronger statin. - Lipid panel  5. Type II diabetes mellitus with complication (HCC) Clinically stable by exam and report without s/s of hypoglycemia. DM complicated by hypertension and dyslipidemia. Tolerating Metformin well without side effects or other concerns. She would like to try Rybelsus again or Ozempic.  She will check on coverage and we will decide after A1C. She needs to schedule Eye exam. - Comprehensive metabolic panel - Hemoglobin A1c - Microalbumin / creatinine urine ratio  6. Rectal lesion Seen on Colonoscopy in 2020 and referred but pt declined (likely did not understand the reason for the referral) - Ambulatory referral to General Surgery   Partially dictated using Dragon software. Any errors are unintentional.  Halina Maidens, MD [facility]  03/13/2021

## 2021-03-13 NOTE — Patient Instructions (Addendum)
Check on coverage/authorization approval of these diabetic meds:  Rybelsus  Ozempic,  Mounjaro  Also ask the eye doctor to send me a copy of your eye exam.

## 2021-03-14 LAB — COMPREHENSIVE METABOLIC PANEL
ALT: 67 IU/L — ABNORMAL HIGH (ref 0–32)
AST: 44 IU/L — ABNORMAL HIGH (ref 0–40)
Albumin/Globulin Ratio: 1.9 (ref 1.2–2.2)
Albumin: 4.8 g/dL (ref 3.8–4.9)
Alkaline Phosphatase: 92 IU/L (ref 44–121)
BUN/Creatinine Ratio: 28 — ABNORMAL HIGH (ref 9–23)
BUN: 25 mg/dL — ABNORMAL HIGH (ref 6–24)
Bilirubin Total: 0.2 mg/dL (ref 0.0–1.2)
CO2: 25 mmol/L (ref 20–29)
Calcium: 10.3 mg/dL — ABNORMAL HIGH (ref 8.7–10.2)
Chloride: 98 mmol/L (ref 96–106)
Creatinine, Ser: 0.88 mg/dL (ref 0.57–1.00)
Globulin, Total: 2.5 g/dL (ref 1.5–4.5)
Glucose: 209 mg/dL — ABNORMAL HIGH (ref 70–99)
Potassium: 4.7 mmol/L (ref 3.5–5.2)
Sodium: 140 mmol/L (ref 134–144)
Total Protein: 7.3 g/dL (ref 6.0–8.5)
eGFR: 76 mL/min/{1.73_m2} (ref >59)

## 2021-03-14 LAB — CBC WITH DIFFERENTIAL/PLATELET
Basophils Absolute: 0.1 10*3/uL (ref 0.0–0.2)
Basos: 1 %
EOS (ABSOLUTE): 0.2 10*3/uL (ref 0.0–0.4)
Eos: 2 %
Hematocrit: 41 % (ref 34.0–46.6)
Hemoglobin: 14.4 g/dL (ref 11.1–15.9)
Immature Grans (Abs): 0 10*3/uL (ref 0.0–0.1)
Immature Granulocytes: 0 %
Lymphocytes Absolute: 2.6 10*3/uL (ref 0.7–3.1)
Lymphs: 27 %
MCH: 30.5 pg (ref 26.6–33.0)
MCHC: 35.1 g/dL (ref 31.5–35.7)
MCV: 87 fL (ref 79–97)
Monocytes Absolute: 0.7 10*3/uL (ref 0.1–0.9)
Monocytes: 7 %
Neutrophils Absolute: 6 10*3/uL (ref 1.4–7.0)
Neutrophils: 63 %
Platelets: 240 10*3/uL (ref 150–450)
RBC: 4.72 x10E6/uL (ref 3.77–5.28)
RDW: 12.9 % (ref 11.7–15.4)
WBC: 9.6 10*3/uL (ref 3.4–10.8)

## 2021-03-14 LAB — HEMOGLOBIN A1C
Est. average glucose Bld gHb Est-mCnc: 183 mg/dL
Hgb A1c MFr Bld: 8 % — ABNORMAL HIGH (ref 4.8–5.6)

## 2021-03-14 LAB — LIPID PANEL
Chol/HDL Ratio: 5 ratio — ABNORMAL HIGH (ref 0.0–4.4)
Cholesterol, Total: 189 mg/dL (ref 100–199)
HDL: 38 mg/dL — ABNORMAL LOW (ref >39)
LDL Chol Calc (NIH): 127 mg/dL — ABNORMAL HIGH (ref 0–99)
Triglycerides: 131 mg/dL (ref 0–149)
VLDL Cholesterol Cal: 24 mg/dL (ref 5–40)

## 2021-03-14 LAB — MICROALBUMIN / CREATININE URINE RATIO
Creatinine, Urine: 69.7 mg/dL
Microalb/Creat Ratio: <4 mg/g creat (ref 0–29)
Microalbumin, Urine: <3 ug/mL

## 2021-03-14 LAB — TSH: TSH: 1.27 u[IU]/mL (ref 0.450–4.500)

## 2021-03-16 ENCOUNTER — Other Ambulatory Visit: Payer: Self-pay | Admitting: Internal Medicine

## 2021-03-16 ENCOUNTER — Telehealth: Payer: Self-pay | Admitting: Internal Medicine

## 2021-03-16 DIAGNOSIS — E118 Type 2 diabetes mellitus with unspecified complications: Secondary | ICD-10-CM

## 2021-03-16 DIAGNOSIS — I10 Essential (primary) hypertension: Secondary | ICD-10-CM

## 2021-03-16 MED ORDER — DAPAGLIFLOZIN PROPANEDIOL 10 MG PO TABS: 10.0000 mg | ORAL_TABLET | Freq: Every day | ORAL | 1 refills | Status: DC

## 2021-03-16 NOTE — Telephone Encounter (Signed)
Please review

## 2021-03-16 NOTE — Telephone Encounter (Signed)
Requested Prescriptions  Pending Prescriptions Disp Refills   metFORMIN (GLUCOPHAGE-XR) 500 MG 24 hr tablet [Pharmacy Med Name: metFORMIN HCl ER 500 MG Oral Tablet Extended Release 24 Hour] 180 tablet 1    Sig: TAKE 1 TABLET BY MOUTH  TWICE DAILY BEFORE A MEAL     Endocrinology:  Diabetes - Biguanides Failed - 03/16/2021 10:20 AM      Failed - HBA1C is between 0 and 7.9 and within 180 days    Hgb A1c MFr Bld  Date Value Ref Range Status  03/13/2021 8.0 (H) 4.8 - 5.6 % Final    Comment:             Prediabetes: 5.7 - 6.4          Diabetes: >6.4          Glycemic control for adults with diabetes: <7.0          Failed - B12 Level in normal range and within 720 days    No results found for: VITAMINB12       Passed - Cr in normal range and within 360 days    Creatinine, Ser  Date Value Ref Range Status  03/13/2021 0.88 0.57 - 1.00 mg/dL Final         Passed - eGFR in normal range and within 360 days    GFR calc Af Amer  Date Value Ref Range Status  01/28/2020 86 >59 mL/min/1.73 Final    Comment:    **In accordance with recommendations from the NKF-ASN Task force,**   [facility] is in the process of updating its eGFR calculation to the   2021 CKD-EPI creatinine equation that estimates kidney function   without a race variable.    GFR calc non Af Amer  Date Value Ref Range Status  01/28/2020 75 >59 mL/min/1.73 Final   eGFR  Date Value Ref Range Status  03/13/2021 76 >59 mL/min/1.73 Final         Passed - Valid encounter within last 6 months    Recent Outpatient Visits          3 days ago Annual physical exam   [facility] Glean Hess, MD   4 months ago Hyperlipidemia associated with type 2 diabetes mellitus [facility])   [facility] Glean Hess, MD   9 months ago Type II diabetes mellitus with complication [facility])   [facility] Glean Hess, MD   1 year ago Annual physical exam   [facility] Glean Hess, MD   1 year  ago DM (diabetes mellitus) with complications [facility])   [facility] Glean Hess, MD      Future Appointments            In 4 months Glean Hess, MD [facility] [place] within normal limits and completed in the last 12 months    WBC  Date Value Ref Range Status  03/13/2021 9.6 3.4 - 10.8 x10E3/uL Final   RBC  Date Value Ref Range Status  03/13/2021 4.72 3.77 - 5.28 x10E6/uL Final  04/04/2015 2 (A) 4.04 - 5.48 M/uL Final   Hemoglobin  Date Value Ref Range Status  03/13/2021 14.4 11.1 - 15.9 g/dL Final   Hematocrit  Date Value Ref Range Status  03/13/2021 41.0 34.0 - 46.6 % Final   MCHC  Date Value Ref Range Status  03/13/2021 35.1 31.5 -  35.7 g/dL Final   [facility]  Date Value Ref Range Status  03/13/2021 30.5 26.6 - 33.0 pg Final   MCV  Date Value Ref Range Status  03/13/2021 87 79 - 97 fL Final   No results found for: PLTCOUNTKUC, LABPLAT, POCPLA RDW  Date Value Ref Range Status  03/13/2021 12.9 11.7 - 15.4 % Final          amLODipine (NORVASC) 5 MG tablet [Pharmacy Med Name: amLODIPine Besylate 5 MG Oral Tablet] 90 tablet 1    Sig: TAKE 1 TABLET BY MOUTH  DAILY     Cardiovascular: Calcium Channel Blockers 2 Passed - 03/16/2021 10:20 AM      Passed - Last BP in normal range    BP Readings from Last 1 Encounters:  03/13/21 126/78         Passed - Last Heart Rate in normal range    Pulse Readings from Last 1 Encounters:  03/13/21 89         Passed - Valid encounter within last 6 months    Recent Outpatient Visits          3 days ago Annual physical exam   [facility] Glean Hess, MD   4 months ago Hyperlipidemia associated with type 2 diabetes mellitus [facility])   [facility] Glean Hess, MD   9 months ago Type II diabetes mellitus with complication [facility])   [facility] Glean Hess, MD   1 year ago Annual physical exam   [facility] Glean Hess,  MD   1 year ago DM (diabetes mellitus) with complications [facility])   [facility] Glean Hess, MD      Future Appointments            In 4 months Army Melia Jesse Sans, MD [facility], [facility]

## 2021-03-16 NOTE — Telephone Encounter (Signed)
Pt is calling to report that Rybelsus - is in need of a PA $900. Ozempic for 2 times a month is $900. A Prior Authorization is needed too. Darcel Bayley is not covered  Please advise CB-  [phone]

## 2021-03-16 NOTE — Progress Notes (Signed)
Spoke to pt she stated that Rybelses is not covered that she will see about Ozempic. Pt wants a new medication for cholesterol sent to [facility].  KP

## 2021-03-16 NOTE — Telephone Encounter (Signed)
Spoke to pt let her know that Wilder Glade was added. Pt verbalized understanding.  KP

## 2021-03-17 ENCOUNTER — Encounter: Payer: Self-pay | Admitting: Internal Medicine

## 2021-04-01 ENCOUNTER — Encounter: Payer: Self-pay | Admitting: Surgery

## 2021-04-01 ENCOUNTER — Ambulatory Visit (INDEPENDENT_AMBULATORY_CARE_PROVIDER_SITE_OTHER): Payer: BC Managed Care – PPO | Admitting: Surgery

## 2021-04-01 ENCOUNTER — Other Ambulatory Visit: Payer: Self-pay

## 2021-04-01 VITALS — BP 112/62 | HR 106 | Temp 98.5°F | Ht 65.0 in | Wt 275.8 lb

## 2021-04-01 DIAGNOSIS — K620 Anal polyp: Secondary | ICD-10-CM | POA: Diagnosis not present

## 2021-04-01 NOTE — Patient Instructions (Signed)
If you have any concerns or questions, please feel free to call our office. Follow up in 6 months.   Hemorrhoids Hemorrhoids are swollen veins that may develop: In the butt (rectum). These are called internal hemorrhoids. Around the opening of the butt (anus). These are called external hemorrhoids. Hemorrhoids can cause pain, itching, or bleeding. Most of the time, they do not cause serious problems. They usually get better with diet changes, lifestyle changes, and other home treatments. What are the causes? This condition may be caused by: Having trouble pooping (constipation). Pushing hard (straining) to poop. Watery poop (diarrhea). Pregnancy. Being very overweight (obese). Sitting for long periods of time. Heavy lifting or other activity that causes you to strain. Anal sex. Riding a bike for a long period of time. What are the signs or symptoms? Symptoms of this condition include: Pain. Itching or soreness in the butt. Bleeding from the butt. Leaking poop. Swelling in the area. One or more lumps around the opening of your butt. How is this diagnosed? A doctor can often diagnose this condition by looking at the affected area. The doctor may also: Do an exam that involves feeling the area with a gloved hand (digital rectal exam). Examine the area inside your butt using a small tube (anoscope). Order blood tests. This may be done if you have lost a lot of blood. Have you get a test that involves looking inside the colon using a flexible tube with a camera on the end (sigmoidoscopy or colonoscopy). How is this treated? This condition can usually be treated at home. Your doctor may tell you to change what you eat, make lifestyle changes, or try home treatments. If these do not help, procedures can be done to remove the hemorrhoids or make them smaller. These may involve: Placing rubber bands at the base of the hemorrhoids to cut off their blood supply. Injecting medicine into the  hemorrhoids to shrink them. Shining a type of light energy onto the hemorrhoids to cause them to fall off. Doing surgery to remove the hemorrhoids or cut off their blood supply. Follow these instructions at home: Eating and drinking  Eat foods that have a lot of fiber in them. These include whole grains, beans, nuts, fruits, and vegetables. Ask your doctor about taking products that have added fiber (fibersupplements). Reduce the amount of fat in your diet. You can do this by: Eating low-fat dairy products. Eating less red meat. Avoiding processed foods. Drink enough fluid to keep your pee (urine) pale yellow. Managing pain and swelling  Take a warm-water bath (sitz bath) for 20 minutes to ease pain. Do this 3-4 times a day. You may do this in a bathtub or using a portable sitz bath that fits over the toilet. If told, put ice on the painful area. It may be helpful to use ice between your warm baths. Put ice in a plastic bag. Place a towel between your skin and the bag. Leave the ice on for 20 minutes, 2-3 times a day. General instructions Take over-the-counter and prescription medicines only as told by your doctor. Medicated creams and medicines may be used as told. Exercise often. Ask your doctor how much and what kind of exercise is best for you. Go to the bathroom when you have the urge to poop. Do not wait. Avoid pushing too hard when you poop. Keep your butt dry and clean. Use wet toilet paper or moist towelettes after pooping. Do not sit on the toilet for a long  time. Keep all follow-up visits as told by your doctor. This is important. Contact a doctor if you: Have pain and swelling that do not get better with treatment or medicine. Have trouble pooping. Cannot poop. Have pain or swelling outside the area of the hemorrhoids. Get help right away if you have: Bleeding that will not stop. Summary Hemorrhoids are swollen veins in the butt or around the opening of the  butt. They can cause pain, itching, or bleeding. Eat foods that have a lot of fiber in them. These include whole grains, beans, nuts, fruits, and vegetables. Take a warm-water bath (sitz bath) for 20 minutes to ease pain. Do this 3-4 times a day. This information is not intended to replace advice given to you by your health care provider. Make sure you discuss any questions you have with your health care provider. Document Revised: 08/06/2020 Document Reviewed: 08/06/2020 Elsevier Patient Education  [place].

## 2021-04-03 NOTE — Progress Notes (Signed)
Patient ID: Anita Krueger, female   DOB: 1961/01/31, 61 y.o.   MRN: [id number]  HPI Anita Krueger is a 61 y.o. female in consultation at the request of Dr. Army Melia for anorectal polypoid lesion.  This was discovered 3 years ago by Dr. Vicente Males when he perform a colonoscopy.  Please note that I have personally reviewed the images of the procedure on 2020.  It was a 50 mm pedunculated lesion.  She denies any fevers any chills she denies any anorectal pain she denies any hematochezia.  Denies abdominal pain no weight loss no family history of colorectal cancer. Phillip Heal from last year personally reviewed showing no concerning lesions.  He was he was completely normal.  CMP was normal except sugar of 200.  She is a diabetic HPI  Past Medical History:  Diagnosis Date   Arthritis    Bell's palsy 2016   right eye droops   Diabetes mellitus without complication ([place])    Hyperlipidemia    Hypertension    Mood disorder ([place]) 06/29/2019    Past Surgical History:  Procedure Laterality Date   bell's palsy  2016   right eye sluggish   COLONOSCOPY WITH PROPOFOL N/A 03/31/2018   Procedure: COLONOSCOPY WITH PROPOFOL;  Surgeon: Jonathon Bellows, MD;  Location: [facility] ENDOSCOPY;  Service: Gastroenterology;  Laterality: N/A;   DILATATION & CURETTAGE/HYSTEROSCOPY WITH MYOSURE N/A 03/31/2017   Procedure: DILATATION & CURETTAGE/HYSTEROSCOPY WITH MYOSURE;  Surgeon: [name], MD;  Location: [facility] ORS;  Service: Gynecology;  Laterality: N/A;   LAPAROSCOPY      Family History  Problem Relation Age of Onset   Hypertension Mother    Diabetes Mother    Hypertension Father    Breast cancer Cousin        PAT COUSIN    Social History Social History   Tobacco Use   Smoking status: Former    Types: Cigarettes   Smokeless tobacco: Never   Tobacco comments:    40 years ago  Substance Use Topics   Alcohol use: Yes    Alcohol/week: 2.0 standard drinks    Types: 2 Shots of liquor per week    Comment: occasional    Drug use: No    No Known Allergies  Current Outpatient Medications  Medication Sig Dispense Refill   Acetaminophen (TYLENOL PO) Take by mouth as needed.     amLODipine (NORVASC) 5 MG tablet TAKE 1 TABLET BY MOUTH  DAILY 90 tablet 1   Blood Glucose Monitoring Suppl (CONTOUR NEXT MONITOR) w/Device KIT USE AS DIRECTED 1 kit 0   Carboxymethylcellul-Glycerin 1-0.25 % SOLN Place 1 drop into both eyes 3 (three) times daily as needed (dry eyes).     carboxymethylcellulose (REFRESH PLUS) 0.5 % SOLN Place 1 drop into both eyes 3 (three) times daily as needed (dry eyes).     Ferrous Sulfate (IRON PO) Take by mouth daily.     glucose blood (ONETOUCH ULTRA) test strip USE 1 STRIP UP TO 4 TIMES  DAILY AS DIRECTED 100 strip 12   Lancets (ONETOUCH DELICA PLUS [id number]) MISC 1 each by Does not apply route in the morning and at bedtime. 100 each 3   loratadine (CLARITIN) 10 MG tablet Take 10 mg by mouth daily as needed for allergies.     metFORMIN (GLUCOPHAGE-XR) 500 MG 24 hr tablet TAKE 1 TABLET BY MOUTH  TWICE DAILY BEFORE A MEAL 180 tablet 1   Multiple Vitamins-Minerals (DAILY MULTIVITAMIN PO) Take 2 tablets by mouth daily.  olmesartan-hydrochlorothiazide (BENICAR HCT) 40-25 MG tablet Take 1 tablet by mouth daily. 90 tablet 1   pravastatin (PRAVACHOL) 40 MG tablet TAKE 1 TABLET BY MOUTH DAILY 90 tablet 0   UNABLE TO FIND as needed. Med Name: biofreeze     No current facility-administered medications for this visit.     Review of Systems Full ROS  was asked and was negative except for the information on the HPI  Physical Exam Blood pressure 112/62, pulse (!) 106, temperature 98.5 F (36.9 C), temperature source Oral, height _0  (1.651 m), weight 275 lb 12.8 oz (125.1 kg), last menstrual period 05/22/2018, SpO2 95 %. CONSTITUTIONAL: NAD. EYES: Pupils are equal, round,  Sclera are non-icteric. EARS, NOSE, MOUTH AND THROAT:She is wearing a mask. Hearing is intact to voice. LYMPH NODES:   Lymph nodes in the neck are normal. RESPIRATORY:  Lungs are clear. There is normal respiratory effort, with equal breath sounds bilaterally, and without pathologic use of accessory muscles. CARDIOVASCULAR: Heart is regular without murmurs, gallops, or rubs. GI: The abdomen is  soft, nontender, and nondistended. There are no palpable masses. There is no hepatosplenomegaly. There are normal bowel sounds in all quadrants. Rectal: There is polypoid lesion originating from the rectal orifice.  It is intraluminal and pedunculated.  It is soft there is no evidence of erosion ulcerations or masses to suggest malignancy.  There is no blood MUSCULOSKELETAL: Normal muscle strength and tone. No cyanosis or edema.   SKIN: Turgor is good and there are no pathologic skin lesions or ulcers. NEUROLOGIC: Motor and sensation is grossly normal. Cranial nerves are grossly intact. PSYCH:  Oriented to person, place and time. Affect is normal.  Data Reviewed  I have personally reviewed the patient's imaging, laboratory findings and medical records.    Assessment/Plan 61 year old female with anorectal polypoid lesion and differential will include benign polyp versus internal/external hemorrhoidal tissue.  I had an extensive discussion with the patient about my thought process.  This certainly does not look malignant.  Options of proceeding to the operating room for exam under anesthesia and excision discussed with the patient.  She is very hesitant about excision and prefers to have watchful waiting.  I did state to her that I will like to see her in about 6 months to assess for the lesion.   Is noted spent more than 45 minutes in this encounter including personally reviewing records, counseling the patient, placing orders and performing a proper documentation.. A copy of this report was sent to the referring provider    Caroleen Hamman, MD FACS General Surgeon 04/03/2021, 1:10 PM

## 2021-04-30 ENCOUNTER — Other Ambulatory Visit: Payer: Self-pay

## 2021-04-30 ENCOUNTER — Ambulatory Visit
Admission: RE | Admit: 2021-04-30 | Discharge: 2021-04-30 | Disposition: A | Discharge status: Home / Self Care | Payer: BC Managed Care – PPO | Source: Ambulatory Visit | Attending: Internal Medicine | Admitting: Internal Medicine

## 2021-04-30 DIAGNOSIS — Z1231 Encounter for screening mammogram for malignant neoplasm of breast: Secondary | ICD-10-CM | POA: Insufficient documentation

## 2021-05-20 DIAGNOSIS — H35033 Hypertensive retinopathy, bilateral: Secondary | ICD-10-CM | POA: Diagnosis not present

## 2021-05-20 LAB — HM DIABETES EYE EXAM

## 2021-05-21 ENCOUNTER — Other Ambulatory Visit: Payer: Self-pay | Admitting: Internal Medicine

## 2021-05-21 NOTE — Telephone Encounter (Signed)
Requested Prescriptions  ?Pending Prescriptions Disp Refills  ?? olmesartan-hydrochlorothiazide (BENICAR HCT) 40-25 MG tablet [Pharmacy Med Name: Olmesartan Medoxomil-HCTZ 40-25 MG Oral Tablet] 90 tablet 1  ?  Sig: TAKE 1 TABLET BY MOUTH DAILY  ?  ? Cardiovascular: ARB + Diuretic Combos Passed - 05/21/2021  6:25 AM  ?  ?  Passed - K in normal range and within 180 days  ?  Potassium  ?Date Value Ref Range Status  ?03/13/2021 4.7 3.5 - 5.2 mmol/L Final  ?   ?  ?  Passed - Na in normal range and within 180 days  ?  Sodium  ?Date Value Ref Range Status  ?03/13/2021 140 134 - 144 mmol/L Final  ?   ?  ?  Passed - Cr in normal range and within 180 days  ?  Creatinine, Ser  ?Date Value Ref Range Status  ?03/13/2021 0.88 0.57 - 1.00 mg/dL Final  ?   ?  ?  Passed - eGFR is 10 or above and within 180 days  ?  GFR calc Af Amer  ?Date Value Ref Range Status  ?01/28/2020 86 >59 mL/min/1.73 Final  ?  Comment:  ?  **In accordance with recommendations from the NKF-ASN Task force,** ?  [facility] is in the process of updating its eGFR calculation to the ?  2021 CKD-EPI creatinine equation that estimates kidney function ?  without a race variable. ?  ? ?GFR calc non Af Amer  ?Date Value Ref Range Status  ?01/28/2020 75 >59 mL/min/1.73 Final  ? ?eGFR  ?Date Value Ref Range Status  ?03/13/2021 76 >59 mL/min/1.73 Final  ?   ?  ?  Passed - Patient is not pregnant  ?  ?  Passed - Last BP in normal range  ?  BP Readings from Last 1 Encounters:  ?04/01/21 112/62  ?   ?  ?  Passed - Valid encounter within last 6 months  ?  Recent Outpatient Visits   ?      ? 2 months ago Annual physical exam  ? [facility] Glean Hess, MD  ? 6 months ago Hyperlipidemia associated with type 2 diabetes mellitus ([place])  ? [facility] Glean Hess, MD  ? 11 months ago Type II diabetes mellitus with complication [facility])  ? [facility] Glean Hess, MD  ? 1 year ago Annual physical exam  ? [facility] Glean Hess, MD  ? 1 year ago DM (diabetes mellitus) with complications [facility])  ? [facility] Glean Hess, MD  ?  ?  ?Future Appointments   ?        ? In 1 month Army Melia Jesse Sans, MD [facility] [place]  ?  ? ?  ?  ?  ? ?

## 2021-06-12 ENCOUNTER — Other Ambulatory Visit: Payer: Self-pay | Admitting: Internal Medicine

## 2021-06-12 ENCOUNTER — Telehealth: Payer: Self-pay | Admitting: Internal Medicine

## 2021-06-12 DIAGNOSIS — E1169 Type 2 diabetes mellitus with other specified complication: Secondary | ICD-10-CM

## 2021-06-12 DIAGNOSIS — E118 Type 2 diabetes mellitus with unspecified complications: Secondary | ICD-10-CM

## 2021-06-12 MED ORDER — PRAVASTATIN SODIUM 40 MG PO TABS: 40.0000 mg | ORAL_TABLET | Freq: Every day | ORAL | 3 refills | Status: DC

## 2021-06-12 MED ORDER — EMPAGLIFLOZIN 25 MG PO TABS: 25.0000 mg | ORAL_TABLET | Freq: Every day | ORAL | 0 refills | Status: DC

## 2021-06-12 NOTE — Telephone Encounter (Signed)
Please review.  KP

## 2021-06-12 NOTE — Telephone Encounter (Signed)
Pt informed that Jardiance was sent to mail order. She verbalized understanding. ? ?KP ?

## 2021-06-12 NOTE — Telephone Encounter (Signed)
Pt is calling because she checked with her insurance [employer] of [place]- they are recommending Jardiance it is a tier 2 drug with a $0 copay. Pt will need a PA.  ? ?[phone] ?

## 2021-07-17 ENCOUNTER — Ambulatory Visit: Payer: BC Managed Care – PPO | Admitting: Internal Medicine

## 2021-08-04 ENCOUNTER — Other Ambulatory Visit: Payer: Self-pay | Admitting: Internal Medicine

## 2021-08-04 DIAGNOSIS — I10 Essential (primary) hypertension: Secondary | ICD-10-CM

## 2021-08-07 ENCOUNTER — Encounter: Payer: Self-pay | Admitting: Internal Medicine

## 2021-08-07 ENCOUNTER — Ambulatory Visit (INDEPENDENT_AMBULATORY_CARE_PROVIDER_SITE_OTHER): Payer: BC Managed Care – PPO | Admitting: Internal Medicine

## 2021-08-07 VITALS — BP 128/70 | HR 103 | Ht 65.0 in | Wt 276.0 lb

## 2021-08-07 DIAGNOSIS — I10 Essential (primary) hypertension: Secondary | ICD-10-CM

## 2021-08-07 DIAGNOSIS — E1169 Type 2 diabetes mellitus with other specified complication: Secondary | ICD-10-CM | POA: Diagnosis not present

## 2021-08-07 DIAGNOSIS — E118 Type 2 diabetes mellitus with unspecified complications: Secondary | ICD-10-CM | POA: Diagnosis not present

## 2021-08-07 DIAGNOSIS — E785 Hyperlipidemia, unspecified: Secondary | ICD-10-CM

## 2021-08-07 MED ORDER — ROSUVASTATIN CALCIUM 10 MG PO TABS: 10.0000 mg | ORAL_TABLET | Freq: Every day | ORAL | 3 refills | Status: DC

## 2021-08-07 NOTE — Progress Notes (Signed)
Date:  08/07/2021   Name:  Anita Krueger   DOB:  1960/04/13   MRN:  [id number]   Chief Complaint: Diabetes (Patient taking jardiance but it is causing swelling in both ankles. Wants to discuss switching to rybelsus. ) and Hypertension  Diabetes She presents for her follow-up (she is concerned that the jardiance is causing swelling on the top of her feet) diabetic visit. She has type 2 diabetes mellitus. Her disease course has been improving. Pertinent negatives for hypoglycemia include no dizziness, headaches or tremors. Pertinent negatives for diabetes include no chest pain, no fatigue, no polydipsia, no polyuria and no weakness. Pertinent negatives for diabetic complications include no CVA. Current diabetic treatment includes oral agent (dual therapy) (metformin and Jardiance). She is compliant with treatment all of the time. Her home blood glucose trend is decreasing steadily. Her breakfast blood glucose is taken between 8-9 am. Her breakfast blood glucose range is generally 110-130 mg/dl. An ACE inhibitor/angiotensin II receptor blocker is being taken.  Hypertension This is a chronic problem. The problem is controlled. Pertinent negatives include no chest pain, headaches, palpitations or shortness of breath. Past treatments include beta blockers, angiotensin blockers and diuretics. The current treatment provides significant improvement. There is no history of kidney disease, CAD/MI or CVA.  Hyperlipidemia This is a chronic problem. The problem is uncontrolled. Pertinent negatives include no chest pain or shortness of breath. Current antihyperlipidemic treatment includes statins (pravastatin 40 mg). The current treatment provides moderate improvement of lipids.    Lab Results  Component Value Date   NA 140 03/13/2021   K 4.7 03/13/2021   CO2 25 03/13/2021   GLUCOSE 209 (H) 03/13/2021   BUN 25 (H) 03/13/2021   CREATININE 0.88 03/13/2021   CALCIUM 10.3 (H) 03/13/2021   EGFR 76 03/13/2021    GFRNONAA 75 01/28/2020   Lab Results  Component Value Date   CHOL 189 03/13/2021   HDL 38 (L) 03/13/2021   LDLCALC 127 (H) 03/13/2021   TRIG 131 03/13/2021   CHOLHDL 5.0 (H) 03/13/2021   Lab Results  Component Value Date   TSH 1.270 03/13/2021   Lab Results  Component Value Date   HGBA1C 8.0 (H) 03/13/2021   Lab Results  Component Value Date   WBC 9.6 03/13/2021   HGB 14.4 03/13/2021   HCT 41.0 03/13/2021   MCV 87 03/13/2021   PLT 240 03/13/2021   Lab Results  Component Value Date   ALT 67 (H) 03/13/2021   AST 44 (H) 03/13/2021   ALKPHOS 92 03/13/2021   BILITOT 0.2 03/13/2021   No results found for: "25OHVITD2", "25OHVITD3", "VD25OH"   Review of Systems  Constitutional:  Negative for appetite change, fatigue, fever and unexpected weight change.  HENT:  Negative for nosebleeds, tinnitus and trouble swallowing.   Eyes:  Negative for visual disturbance.  Respiratory:  Negative for cough, chest tightness, shortness of breath and wheezing.   Cardiovascular:  Negative for chest pain, palpitations and leg swelling.  Gastrointestinal:  Negative for abdominal pain, constipation and diarrhea.  Endocrine: Negative for polydipsia and polyuria.  Genitourinary:  Negative for dysuria and hematuria.  Musculoskeletal:  Negative for arthralgias.  Neurological:  Negative for dizziness, tremors, weakness, light-headedness, numbness and headaches.  Psychiatric/Behavioral:  Negative for dysphoric mood.     Patient Active Problem List   Diagnosis Date Noted   Patellar tendonitis of right knee 11/07/2020   Tubular adenoma of colon 04/03/2018   Iron deficiency anemia 01/24/2018   Complex endometrial  hyperplasia without atypia 02/04/2017   Menorrhagia with irregular cycle 01/19/2017   Hip pain, acute, left 05/12/2016   Essential hypertension 04/04/2015   Type II diabetes mellitus with complication ([place]) 02/72/5366   Hyperlipidemia associated with type 2 diabetes mellitus ([place])  09/29/2014    No Known Allergies  Past Surgical History:  Procedure Laterality Date   bell's palsy  2016   right eye sluggish   COLONOSCOPY WITH PROPOFOL N/A 03/31/2018   Procedure: COLONOSCOPY WITH PROPOFOL;  Surgeon: Jonathon Bellows, MD;  Location: [facility] ENDOSCOPY;  Service: Gastroenterology;  Laterality: N/A;   DILATATION & CURETTAGE/HYSTEROSCOPY WITH MYOSURE N/A 03/31/2017   Procedure: DILATATION & CURETTAGE/HYSTEROSCOPY WITH MYOSURE;  Surgeon: [name], MD;  Location: [facility] ORS;  Service: Gynecology;  Laterality: N/A;   LAPAROSCOPY      Social History   Tobacco Use   Smoking status: Former    Types: Cigarettes   Smokeless tobacco: Never   Tobacco comments:    40 years ago  Substance Use Topics   Alcohol use: Yes    Alcohol/week: 2.0 standard drinks of alcohol    Types: 2 Shots of liquor per week    Comment: occasional   Drug use: No     Medication list has been reviewed and updated.  Current Meds  Medication Sig   Acetaminophen (TYLENOL PO) Take by mouth as needed.   amLODipine (NORVASC) 5 MG tablet TAKE 1 TABLET BY MOUTH DAILY   Blood Glucose Monitoring Suppl (CONTOUR NEXT MONITOR) w/Device KIT USE AS DIRECTED   Carboxymethylcellul-Glycerin 1-0.25 % SOLN Place 1 drop into both eyes 3 (three) times daily as needed (dry eyes).   carboxymethylcellulose (REFRESH PLUS) 0.5 % SOLN Place 1 drop into both eyes 3 (three) times daily as needed (dry eyes).   empagliflozin (JARDIANCE) 25 MG TABS tablet Take 1 tablet (25 mg total) by mouth daily before breakfast.   Ferrous Sulfate (IRON PO) Take by mouth daily.   glucose blood (ONETOUCH ULTRA) test strip USE 1 STRIP UP TO 4 TIMES  DAILY AS DIRECTED   Lancets (ONETOUCH DELICA PLUS [id number]) MISC 1 each by Does not apply route in the morning and at bedtime.   loratadine (CLARITIN) 10 MG tablet Take 10 mg by mouth daily as needed for allergies.   metFORMIN (GLUCOPHAGE-XR) 500 MG 24 hr tablet TAKE 1 TABLET BY MOUTH  TWICE DAILY  BEFORE A MEAL   Multiple Vitamins-Minerals (DAILY MULTIVITAMIN PO) Take 2 tablets by mouth daily.   olmesartan-hydrochlorothiazide (BENICAR HCT) 40-25 MG tablet TAKE 1 TABLET BY MOUTH DAILY   pravastatin (PRAVACHOL) 40 MG tablet Take 1 tablet (40 mg total) by mouth daily.   UNABLE TO FIND as needed. Med Name: biofreeze       08/07/2021   11:04 AM 03/13/2021    9:15 AM 11/07/2020   10:33 AM 06/06/2020    9:56 AM  GAD 7 : Generalized Anxiety Score  Nervous, Anxious, on Edge 1 1 1 1   Control/stop worrying 0 1 1 0  Worry too much - different things 1 0 0 0  Trouble relaxing 0 0 0 0  Restless 1 0 0 0  Easily annoyed or irritable 1 0 0 0  Afraid - awful might happen 1 0 0 0  Total GAD 7 Score 5 2 2 1   Anxiety Difficulty Not difficult at all Not difficult at all Not difficult at all Not difficult at all       08/07/2021   11:04 AM 03/13/2021  9:14 AM 11/07/2020   10:33 AM  Depression screen PHQ 2/9  Decreased Interest 1 1 0  Down, Depressed, Hopeless 2 1 0  PHQ - 2 Score 3 2 0  Altered sleeping 2 1 1   Tired, decreased energy 2 2 1   Change in appetite 1 2 1   Feeling bad or failure about yourself  2 0 0  Trouble concentrating 1 0 0  Moving slowly or fidgety/restless 0 0 0  Suicidal thoughts  0 0  PHQ-9 Score 11 7 3   Difficult doing work/chores Very difficult Not difficult at all Not difficult at all    BP Readings from Last 3 Encounters:  08/07/21 128/70  04/01/21 112/62  03/13/21 126/78    Physical Exam Vitals and nursing note reviewed.  Constitutional:      General: She is not in acute distress.    Appearance: She is well-developed.  HENT:     Head: Normocephalic and atraumatic.  Cardiovascular:     Rate and Rhythm: Normal rate and regular rhythm.     Pulses: Normal pulses.  Pulmonary:     Effort: Pulmonary effort is normal. No respiratory distress.  Musculoskeletal:     Right lower leg: No edema.     Left lower leg: No edema.  Feet:     Comments: Puffiness top of  both feet - no ankle or LE edema, no pitting Skin:    General: Skin is warm and dry.     Capillary Refill: Capillary refill takes less than 2 seconds.     Findings: No rash.  Neurological:     General: No focal deficit present.     Mental Status: She is alert and oriented to person, place, and time.  Psychiatric:        Mood and Affect: Mood normal.        Behavior: Behavior normal.     Wt Readings from Last 3 Encounters:  08/07/21 276 lb (125.2 kg)  04/01/21 275 lb 12.8 oz (125.1 kg)  03/13/21 273 lb (123.8 kg)    BP 128/70   Pulse (!) 103   Ht 5' 5"  (1.651 m)   Wt 276 lb (125.2 kg)   LMP 05/22/2018 (Exact Date)   SpO2 94%   BMI 45.93 kg/m   Assessment and Plan: 1. Type II diabetes mellitus with complication (HCC) Much improved BS on Jardiance but concern of swelling Recommend cutting the dose in half and monitoring Continue metformin - Basic metabolic panel - Hemoglobin A1c  2. Essential hypertension Clinically stable exam with well controlled BP. Tolerating medications without side effects at this time. Pt to continue current regimen and low sodium diet; benefits of regular exercise as able discussed.  3. Hyperlipidemia associated with type 2 diabetes mellitus ([place]) Will start Crestor in place of Pravachol and recheck lipids next visit. - rosuvastatin (CRESTOR) 10 MG tablet; Take 1 tablet (10 mg total) by mouth daily.  Dispense: 90 tablet; Refill: 3   Partially dictated using Editor, commissioning. Any errors are unintentional.  Halina Maidens, MD [facility]  08/07/2021

## 2021-08-08 LAB — BASIC METABOLIC PANEL
BUN/Creatinine Ratio: 32 — ABNORMAL HIGH (ref 9–23)
BUN: 25 mg/dL — ABNORMAL HIGH (ref 6–24)
CO2: 23 mmol/L (ref 20–29)
Calcium: 10.4 mg/dL — ABNORMAL HIGH (ref 8.7–10.2)
Chloride: 98 mmol/L (ref 96–106)
Creatinine, Ser: 0.77 mg/dL (ref 0.57–1.00)
Glucose: 124 mg/dL — ABNORMAL HIGH (ref 70–99)
Potassium: 4.4 mmol/L (ref 3.5–5.2)
Sodium: 141 mmol/L (ref 134–144)
eGFR: 89 mL/min/{1.73_m2} (ref >59)

## 2021-08-08 LAB — HEMOGLOBIN A1C
Est. average glucose Bld gHb Est-mCnc: 157 mg/dL
Hgb A1c MFr Bld: 7.1 % — ABNORMAL HIGH (ref 4.8–5.6)

## 2021-08-13 ENCOUNTER — Other Ambulatory Visit: Payer: Self-pay | Admitting: Internal Medicine

## 2021-08-13 DIAGNOSIS — E118 Type 2 diabetes mellitus with unspecified complications: Secondary | ICD-10-CM

## 2021-08-13 NOTE — Telephone Encounter (Signed)
Requested Prescriptions  Pending Prescriptions Disp Refills  . metFORMIN (GLUCOPHAGE-XR) 500 MG 24 hr tablet [Pharmacy Med Name: metFORMIN HCl ER 500 MG Oral Tablet Extended Release 24 Hour] 180 tablet 3    Sig: TAKE 1 TABLET BY MOUTH TWICE  DAILY BEFORE A MEAL     Endocrinology:  Diabetes - Biguanides Failed - 08/13/2021  5:14 AM      Failed - B12 Level in normal range and within 720 days    No results found for: "VITAMINB12"       Passed - Cr in normal range and within 360 days    Creatinine, Ser  Date Value Ref Range Status  08/07/2021 0.77 0.57 - 1.00 mg/dL Final         Passed - HBA1C is between 0 and 7.9 and within 180 days    Hgb A1c MFr Bld  Date Value Ref Range Status  08/07/2021 7.1 (H) 4.8 - 5.6 % Final    Comment:             Prediabetes: 5.7 - 6.4          Diabetes: >6.4          Glycemic control for adults with diabetes: <7.0          Passed - eGFR in normal range and within 360 days    GFR calc Af Amer  Date Value Ref Range Status  01/28/2020 86 >59 mL/min/1.73 Final    Comment:    **In accordance with recommendations from the NKF-ASN Task force,**   [facility] is in the process of updating its eGFR calculation to the   2021 CKD-EPI creatinine equation that estimates kidney function   without a race variable.    GFR calc non Af Amer  Date Value Ref Range Status  01/28/2020 75 >59 mL/min/1.73 Final   eGFR  Date Value Ref Range Status  08/07/2021 89 >59 mL/min/1.73 Final         Passed - Valid encounter within last 6 months    Recent Outpatient Visits          6 days ago Type II diabetes mellitus with complication [facility])   [facility] Glean Hess, MD   5 months ago Annual physical exam   [facility] Glean Hess, MD   9 months ago Hyperlipidemia associated with type 2 diabetes mellitus [facility])   [facility] Glean Hess, MD   1 year ago Type II diabetes mellitus with complication [facility])   [facility]  Glean Hess, MD   1 year ago Annual physical exam   [facility] Glean Hess, MD      Future Appointments            In 3 months Army Melia Jesse Sans, MD [facility], [place]   In 7 months Glean Hess, MD [facility], [place] within normal limits and completed in the last 12 months    WBC  Date Value Ref Range Status  03/13/2021 9.6 3.4 - 10.8 x10E3/uL Final   RBC  Date Value Ref Range Status  03/13/2021 4.72 3.77 - 5.28 x10E6/uL Final  04/04/2015 2 (A) 4.04 - 5.48 M/uL Final   Hemoglobin  Date Value Ref Range Status  03/13/2021 14.4 11.1 - 15.9 g/dL Final   Hematocrit  Date Value Ref Range Status  03/13/2021 41.0 34.0 - 46.6 %  Final   MCHC  Date Value Ref Range Status  03/13/2021 35.1 31.5 - 35.7 g/dL Final   [facility]  Date Value Ref Range Status  03/13/2021 30.5 26.6 - 33.0 pg Final   MCV  Date Value Ref Range Status  03/13/2021 87 79 - 97 fL Final   No results found for: "PLTCOUNTKUC", "LABPLAT", "POCPLA" RDW  Date Value Ref Range Status  03/13/2021 12.9 11.7 - 15.4 % Final

## 2021-08-15 ENCOUNTER — Other Ambulatory Visit: Payer: Self-pay | Admitting: Internal Medicine

## 2021-08-15 DIAGNOSIS — E118 Type 2 diabetes mellitus with unspecified complications: Secondary | ICD-10-CM

## 2021-08-17 NOTE — Telephone Encounter (Signed)
last RF 06/12/21 #90 should have enough until 09/12/21  Requested Prescriptions  Refused Prescriptions Disp Refills  . JARDIANCE 25 MG TABS tablet [Pharmacy Med Name: Jardiance 25 MG Oral Tablet] 90 tablet 3    Sig: TAKE 1 TABLET BY MOUTH DAILY  BEFORE BREAKFAST     Endocrinology:  Diabetes - SGLT2 Inhibitors Passed - 08/15/2021  4:54 AM      Passed - Cr in normal range and within 360 days    Creatinine, Ser  Date Value Ref Range Status  08/07/2021 0.77 0.57 - 1.00 mg/dL Final         Passed - HBA1C is between 0 and 7.9 and within 180 days    Hgb A1c MFr Bld  Date Value Ref Range Status  08/07/2021 7.1 (H) 4.8 - 5.6 % Final    Comment:             Prediabetes: 5.7 - 6.4          Diabetes: >6.4          Glycemic control for adults with diabetes: <7.0          Passed - eGFR in normal range and within 360 days    GFR calc Af Amer  Date Value Ref Range Status  01/28/2020 86 >59 mL/min/1.73 Final    Comment:    **In accordance with recommendations from the NKF-ASN Task force,**   [facility] is in the process of updating its eGFR calculation to the   2021 CKD-EPI creatinine equation that estimates kidney function   without a race variable.    GFR calc non Af Amer  Date Value Ref Range Status  01/28/2020 75 >59 mL/min/1.73 Final   eGFR  Date Value Ref Range Status  08/07/2021 89 >59 mL/min/1.73 Final         Passed - Valid encounter within last 6 months    Recent Outpatient Visits          1 week ago Type II diabetes mellitus with complication [facility])   [facility] Glean Hess, MD   5 months ago Annual physical exam   [facility] Glean Hess, MD   9 months ago Hyperlipidemia associated with type 2 diabetes mellitus [facility])   [facility] Glean Hess, MD   1 year ago Type II diabetes mellitus with complication [facility])   [facility] Glean Hess, MD   1 year ago Annual physical exam   [facility] Glean Hess, MD      Future Appointments            In 3 months Army Melia Jesse Sans, MD [facility], [place]   In 7 months Army Melia Jesse Sans, MD [facility], [facility]

## 2021-09-21 DIAGNOSIS — R197 Diarrhea, unspecified: Secondary | ICD-10-CM | POA: Diagnosis not present

## 2021-09-30 ENCOUNTER — Ambulatory Visit: Payer: BC Managed Care – PPO | Admitting: Surgery

## 2021-11-04 ENCOUNTER — Other Ambulatory Visit: Payer: Self-pay | Admitting: Internal Medicine

## 2021-11-04 DIAGNOSIS — E118 Type 2 diabetes mellitus with unspecified complications: Secondary | ICD-10-CM

## 2021-11-04 NOTE — Telephone Encounter (Signed)
OV in 3 weeks . Requested Prescriptions  Pending Prescriptions Disp Refills  . JARDIANCE 25 MG TABS tablet [Pharmacy Med Name: Jardiance 25 MG Oral Tablet] 90 tablet 0    Sig: TAKE 1 TABLET BY MOUTH DAILY  BEFORE BREAKFAST     Endocrinology:  Diabetes - SGLT2 Inhibitors Passed - 11/04/2021  9:53 AM      Passed - Cr in normal range and within 360 days    Creatinine, Ser  Date Value Ref Range Status  08/07/2021 0.77 0.57 - 1.00 mg/dL Final         Passed - HBA1C is between 0 and 7.9 and within 180 days    Hgb A1c MFr Bld  Date Value Ref Range Status  08/07/2021 7.1 (H) 4.8 - 5.6 % Final    Comment:             Prediabetes: 5.7 - 6.4          Diabetes: >6.4          Glycemic control for adults with diabetes: <7.0          Passed - eGFR in normal range and within 360 days    GFR calc Af Amer  Date Value Ref Range Status  01/28/2020 86 >59 mL/min/1.73 Final    Comment:    **In accordance with recommendations from the NKF-ASN Task force,**   [facility] is in the process of updating its eGFR calculation to the   2021 CKD-EPI creatinine equation that estimates kidney function   without a race variable.    GFR calc non Af Amer  Date Value Ref Range Status  01/28/2020 75 >59 mL/min/1.73 Final   eGFR  Date Value Ref Range Status  08/07/2021 89 >59 mL/min/1.73 Final         Passed - Valid encounter within last 6 months    Recent Outpatient Visits          2 months ago Type II diabetes mellitus with complication [facility])   [place] [facility] at [facility], Jesse Sans, MD   7 months ago Annual physical exam   [place] [facility] at [facility], Jesse Sans, MD   12 months ago Hyperlipidemia associated with type 2 diabetes mellitus [facility])   [place] [facility] at [facility], Jesse Sans, MD   1 year ago Type II diabetes mellitus with complication [facility])   [place] [facility] Medicine at [facility], Jesse Sans, MD   1 year ago Annual physical exam   [facility] at [facility], Jesse Sans, MD      Future Appointments            In 3 weeks Army Melia Jesse Sans, MD [facility] at [facility], [facility]   In 4 months Army Melia, Jesse Sans, MD [place] [facility] at [facility], [facility]

## 2021-11-27 ENCOUNTER — Ambulatory Visit (INDEPENDENT_AMBULATORY_CARE_PROVIDER_SITE_OTHER): Payer: BC Managed Care – PPO | Admitting: Internal Medicine

## 2021-11-27 ENCOUNTER — Encounter: Payer: Self-pay | Admitting: Internal Medicine

## 2021-11-27 VITALS — BP 126/76 | HR 94 | Ht 65.0 in | Wt 275.0 lb

## 2021-11-27 DIAGNOSIS — E118 Type 2 diabetes mellitus with unspecified complications: Secondary | ICD-10-CM

## 2021-11-27 DIAGNOSIS — I10 Essential (primary) hypertension: Secondary | ICD-10-CM

## 2021-11-27 DIAGNOSIS — E785 Hyperlipidemia, unspecified: Secondary | ICD-10-CM | POA: Diagnosis not present

## 2021-11-27 DIAGNOSIS — E1169 Type 2 diabetes mellitus with other specified complication: Secondary | ICD-10-CM | POA: Diagnosis not present

## 2021-11-27 DIAGNOSIS — Z23 Encounter for immunization: Secondary | ICD-10-CM

## 2021-11-27 NOTE — Progress Notes (Signed)
Date:  11/27/2021   Name:  Anita Krueger   DOB:  [birth year]   MRN:  [id number]   Chief Complaint: Hypertension, Diabetes, and Hyperlipidemia  Hypertension This is a chronic problem. The problem is controlled. Pertinent negatives include no chest pain, headaches, palpitations or shortness of breath. Past treatments include angiotensin blockers and diuretics. The current treatment provides significant improvement.  Diabetes She presents for her follow-up diabetic visit. She has type 2 diabetes mellitus. Pertinent negatives for hypoglycemia include no dizziness, headaches or nervousness/anxiousness. Pertinent negatives for diabetes include no chest pain, no fatigue and no weakness. Current diabetic treatment includes oral agent (dual therapy) (metformin, jardiance (1/2 tab)). She is compliant with treatment all of the time. An ACE inhibitor/angiotensin II receptor blocker is being taken. Eye exam is current.  Hyperlipidemia This is a chronic problem. Recent lipid tests were reviewed and are high. Pertinent negatives include no chest pain or shortness of breath. Current antihyperlipidemic treatment includes statins (medication changes to Crestor).    Lab Results  Component Value Date   NA 141 08/07/2021   K 4.4 08/07/2021   CO2 23 08/07/2021   GLUCOSE 124 (H) 08/07/2021   BUN 25 (H) 08/07/2021   CREATININE 0.77 08/07/2021   CALCIUM 10.4 (H) 08/07/2021   EGFR 89 08/07/2021   GFRNONAA 75 01/28/2020   Lab Results  Component Value Date   CHOL 189 03/13/2021   HDL 38 (L) 03/13/2021   LDLCALC 127 (H) 03/13/2021   TRIG 131 03/13/2021   CHOLHDL 5.0 (H) 03/13/2021   Lab Results  Component Value Date   TSH 1.270 03/13/2021   Lab Results  Component Value Date   HGBA1C 7.1 (H) 08/07/2021   Lab Results  Component Value Date   WBC 9.6 03/13/2021   HGB 14.4 03/13/2021   HCT 41.0 03/13/2021   MCV 87 03/13/2021   PLT 240 03/13/2021   Lab Results  Component Value Date   ALT 67 (H)  03/13/2021   AST 44 (H) 03/13/2021   ALKPHOS 92 03/13/2021   BILITOT 0.2 03/13/2021   No results found for: "25OHVITD2", "25OHVITD3", "VD25OH"   Review of Systems  Constitutional:  Negative for chills, fatigue and unexpected weight change.  HENT:  Negative for nosebleeds and trouble swallowing.   Eyes:  Negative for visual disturbance.  Respiratory:  Negative for cough, chest tightness, shortness of breath and wheezing.   Cardiovascular:  Negative for chest pain, palpitations and leg swelling.  Gastrointestinal:  Negative for abdominal pain, constipation and diarrhea.  Neurological:  Negative for dizziness, weakness, light-headedness and headaches.  Psychiatric/Behavioral:  Negative for dysphoric mood and sleep disturbance. The patient is not nervous/anxious.     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   Menorrhagia with irregular cycle 01/19/2017   Hip pain, acute, left 05/12/2016   Essential hypertension 04/04/2015   Type II diabetes mellitus with complication ([place]) 28/20/8138   Hyperlipidemia associated with type 2 diabetes mellitus ([place]) 09/29/2014    Not on File  Past Surgical History:  Procedure Laterality Date   bell's palsy  2016   right eye sluggish   COLONOSCOPY WITH PROPOFOL N/A 03/31/2018   Procedure: COLONOSCOPY WITH PROPOFOL;  Surgeon: Jonathon Bellows, MD;  Location[facility] END[facility]COPY;  Service: Gastroenterology;  Laterality: N/A;   DILATATION & CURETTAGE/HYSTEROSCOPY WITH MYOSURE  N/A 03/31/2017   Procedure: DILATATION & CURETTAGE/HYSTEROSCOPY WITH MYOSURE;  Surgeon: [name], MD;  Location: [facility] ORS;  Service: Gynecology;  Laterality: N/A;   LAPAROSCOPY      Social History   Tobacco Use   Smoking status: Former    Types: Cigarettes   Smokeless tobacco: Never    Tobacco comments:    40 years ago  Substance Use Topics   Alcohol use: Yes    Alcohol/week: 2.0 standard drinks of alcohol    Types: 2 Shots of liquor per week    Comment: occasional   Drug use: No     Medication list has been reviewed and updated.  Current Meds  Medication Sig   Acetaminophen (TYLENOL PO) Take by mouth as needed.   amLODipine (NORVASC) 5 MG tablet TAKE 1 TABLET BY MOUTH DAILY   Blood Glucose Monitoring Suppl (CONTOUR NEXT MONITOR) w/Device KIT USE AS DIRECTED   Carboxymethylcellul-Glycerin 1-0.25 % SOLN Place 1 drop into both eyes 3 (three) times daily as needed (dry eyes).   carboxymethylcellulose (REFRESH PLUS) 0.5 % SOLN Place 1 drop into both eyes 3 (three) times daily as needed (dry eyes).   Ferrous Sulfate (IRON PO) Take by mouth daily.   glucose blood (ONETOUCH ULTRA) test strip USE 1 STRIP UP TO 4 TIMES  DAILY AS DIRECTED   JARDIANCE 25 MG TABS tablet TAKE 1 TABLET BY MOUTH DAILY  BEFORE BREAKFAST   Lancets (ONETOUCH DELICA PLUS [id number]) MISC 1 each by Does not apply route in the morning and at bedtime.   loratadine (CLARITIN) 10 MG tablet Take 10 mg by mouth daily as needed for allergies.   metFORMIN (GLUCOPHAGE-XR) 500 MG 24 hr tablet TAKE 1 TABLET BY MOUTH TWICE  DAILY BEFORE A MEAL   Multiple Vitamins-Minerals (DAILY MULTIVITAMIN PO) Take 2 tablets by mouth daily.   olmesartan-hydrochlorothiazide (BENICAR HCT) 40-25 MG tablet TAKE 1 TABLET BY MOUTH DAILY   Probiotic Product (PROBIOTIC PO) Take by mouth daily.   rosuvastatin (CRESTOR) 10 MG tablet Take 1 tablet (10 mg total) by mouth daily.   UNABLE TO FIND as needed. Med Name: biofreeze   [DISCONTINUED] hyoscyamine (LEVSIN) 0.125 MG tablet Take by mouth every 6 (six) hours as needed.       11/27/2021    9:47 AM 08/07/2021   11:04 AM 03/13/2021    9:15 AM 11/07/2020   10:33 AM  GAD 7 : Generalized Anxiety Score  Nervous, Anxious, on Edge 1 1 1 1   Control/stop worrying 1 0 1 1  Worry too much -  different things 1 1 0 0  Trouble relaxing 1 0 0 0  Restless 1 1 0 0  Easily annoyed or irritable 1 1 0 0  Afraid - awful might happen 1 1 0 0  Total GAD 7 Score 7 5 2 2   Anxiety Difficulty Somewhat difficult Not difficult at all Not difficult at all Not difficult at all       11/27/2021    9:47 AM 08/07/2021   11:04 AM 03/13/2021    9:14 AM  Depression screen PHQ 2/9  Decreased Interest 1 1 1   Down, Depressed, Hopeless 1 2 1   PHQ - 2 Score 2 3 2   Altered sleeping 2 2 1   Tired, decreased energy 2 2 2   Change in appetite 2 1 2   Feeling bad or failure about yourself  1 2 0  Trouble concentrating 1 1 0  Moving slowly or fidgety/restless 1 0 0  Suicidal  thoughts 0  0  PHQ-9 Score 11 11 7   Difficult doing work/chores Somewhat difficult Very difficult Not difficult at all    BP Readings from Last 3 Encounters:  11/27/21 126/76  08/07/21 128/70  04/01/21 112/62    Physical Exam Vitals and nursing note reviewed.  Constitutional:      General: She is not in acute distress.    Appearance: She is well-developed.  HENT:     Head: Normocephalic and atraumatic.  Cardiovascular:     Rate and Rhythm: Normal rate and regular rhythm.  Pulmonary:     Effort: Pulmonary effort is normal. No respiratory distress.     Breath sounds: No wheezing or rhonchi.  Musculoskeletal:     Cervical back: Normal range of motion.     Right lower leg: No edema.     Left lower leg: No edema.  Lymphadenopathy:     Cervical: No cervical adenopathy.  Skin:    General: Skin is warm and dry.     Findings: No rash.  Neurological:     Mental Status: She is alert and oriented to person, place, and time.  Psychiatric:        Mood and Affect: Mood normal.        Behavior: Behavior normal.     Wt Readings from Last 3 Encounters:  11/27/21 275 lb (124.7 kg)  08/07/21 276 lb (125.2 kg)  04/01/21 275 lb 12.8 oz (125.1 kg)    BP 126/76 (BP Location: Left Arm, Cuff Size: Large)   Pulse 94   Ht 5' 5"   (1.651 m)   Wt 275 lb (124.7 kg)   LMP 05/22/2018 (Exact Date)   SpO2 96%   BMI 45.76 kg/m   Assessment and Plan: 1. Essential hypertension Clinically stable exam with well controlled BP. Tolerating medications without side effects at this time. Pt to continue current regimen and low sodium diet; benefits of regular exercise as able discussed.  2. Type II diabetes mellitus with complication ([place]) Clinically stable by exam and report without s/s of hypoglycemia. DM complicated by hypertension and dyslipidemia. Tolerating medications well without side effects or other concerns. Will advise when labs return. - Comprehensive metabolic panel - Hemoglobin A1c  3. Hyperlipidemia associated with type 2 diabetes mellitus ([place]) Tolerating statin therapy change from atorvastatin to Crestor Will obtain labs and advise - Comprehensive metabolic panel - Lipid panel   Partially dictated using Editor, commissioning. Any errors are unintentional.  Halina Maidens, MD [facility]  11/27/2021

## 2021-11-28 LAB — COMPREHENSIVE METABOLIC PANEL
ALT: 49 IU/L — ABNORMAL HIGH (ref 0–32)
AST: 39 IU/L (ref 0–40)
Albumin/Globulin Ratio: 2 (ref 1.2–2.2)
Albumin: 4.9 g/dL (ref 3.8–4.9)
Alkaline Phosphatase: 91 IU/L (ref 44–121)
BUN/Creatinine Ratio: 32 — ABNORMAL HIGH (ref 12–28)
BUN: 25 mg/dL (ref 8–27)
Bilirubin Total: 0.3 mg/dL (ref 0.0–1.2)
CO2: 28 mmol/L (ref 20–29)
Calcium: 10.3 mg/dL (ref 8.7–10.3)
Chloride: 99 mmol/L (ref 96–106)
Creatinine, Ser: 0.77 mg/dL (ref 0.57–1.00)
Globulin, Total: 2.4 g/dL (ref 1.5–4.5)
Glucose: 124 mg/dL — ABNORMAL HIGH (ref 70–99)
Potassium: 4.4 mmol/L (ref 3.5–5.2)
Sodium: 144 mmol/L (ref 134–144)
Total Protein: 7.3 g/dL (ref 6.0–8.5)
eGFR: 88 mL/min/{1.73_m2} (ref >59)

## 2021-11-28 LAB — LIPID PANEL
Chol/HDL Ratio: 3.5 ratio (ref 0.0–4.4)
Cholesterol, Total: 120 mg/dL (ref 100–199)
HDL: 34 mg/dL — ABNORMAL LOW (ref >39)
LDL Chol Calc (NIH): 64 mg/dL (ref 0–99)
Triglycerides: 124 mg/dL (ref 0–149)
VLDL Cholesterol Cal: 22 mg/dL (ref 5–40)

## 2021-11-28 LAB — HEMOGLOBIN A1C
Est. average glucose Bld gHb Est-mCnc: 148 mg/dL
Hgb A1c MFr Bld: 6.8 % — ABNORMAL HIGH (ref 4.8–5.6)

## 2021-12-14 ENCOUNTER — Other Ambulatory Visit: Payer: Self-pay | Admitting: Internal Medicine

## 2021-12-14 DIAGNOSIS — E118 Type 2 diabetes mellitus with unspecified complications: Secondary | ICD-10-CM

## 2021-12-14 DIAGNOSIS — I10 Essential (primary) hypertension: Secondary | ICD-10-CM

## 2021-12-15 ENCOUNTER — Telehealth: Payer: Self-pay | Admitting: Internal Medicine

## 2021-12-15 ENCOUNTER — Other Ambulatory Visit: Payer: Self-pay | Admitting: Internal Medicine

## 2021-12-15 DIAGNOSIS — E118 Type 2 diabetes mellitus with unspecified complications: Secondary | ICD-10-CM

## 2021-12-15 NOTE — Telephone Encounter (Unsigned)
Copied from [place] [id number]. Topic: General - Other >> Dec 15, 2021  3:14 PM Everette C wrote: Reason for CRM: The patient would like to speak directly with a member of clinical staff when possible regarding their JARDIANCE 25 MG TABS tablet [[id number]]  The patient has called to share that the patient did not get their prescription on 11/04/21 and has been charged as if they did   The patient has been unable to receive their prescription and has been directed by their pharmacy to contact their PCP and request additional confirmation of their prescription with their pharmacy  [facility] ([place], [place] [facility] [place] [address] Phone: [phone] Fax: [phone] Hours: Not open 24 hours  Please contact the patient and their pharmacy further when possible

## 2021-12-15 NOTE — Telephone Encounter (Signed)
Call to patient- patient states that she does not remember ordering her Rx in September- or receiving it from [facility]. Patient states the last Rx she got was dated in May. Patient states she has only been taking half dose so her Rx lasted longer.  Patient advised her PCP may be able to call in Rx to local pharmacy to get her to December -when she is able to RF again with her [facility]. Can not guarantee that her insurance will pay for it. She is going to call [facility] back to see what he options are with them and she will let provider know what she needs.  Patient advised she also needs to let provider know how she is taking medication because her provider makes decisions on her dosing based on information in her chart and her lab values.

## 2021-12-15 NOTE — Telephone Encounter (Signed)
Requested medication (s) are due for refill today:   No  Requested medication (s) are on the active medication list:   Yes  Future visit scheduled:   Yes   Last ordered: 11/04/2021 #90, 0 refills  Returned because a 1 year supply is being requested.     Requested Prescriptions  Pending Prescriptions Disp Refills   JARDIANCE 25 MG TABS tablet [Pharmacy Med Name: Jardiance 25 MG Oral Tablet] 90 tablet 3    Sig: TAKE 1 TABLET BY MOUTH DAILY  BEFORE BREAKFAST     Endocrinology:  Diabetes - SGLT2 Inhibitors Passed - 12/15/2021  9:40 AM      Passed - Cr in normal range and within 360 days    Creatinine, Ser  Date Value Ref Range Status  11/27/2021 0.77 0.57 - 1.00 mg/dL Final         Passed - HBA1C is between 0 and 7.9 and within 180 days    Hgb A1c MFr Bld  Date Value Ref Range Status  11/27/2021 6.8 (H) 4.8 - 5.6 % Final    Comment:             Prediabetes: 5.7 - 6.4          Diabetes: >6.4          Glycemic control for adults with diabetes: <7.0          Passed - eGFR in normal range and within 360 days    GFR calc Af Amer  Date Value Ref Range Status  01/28/2020 86 >59 mL/min/1.73 Final    Comment:    **In accordance with recommendations from the NKF-ASN Task force,**   [facility] is in the process of updating its eGFR calculation to the   2021 CKD-EPI creatinine equation that estimates kidney function   without a race variable.    GFR calc non Af Amer  Date Value Ref Range Status  01/28/2020 75 >59 mL/min/1.73 Final   eGFR  Date Value Ref Range Status  11/27/2021 88 >59 mL/min/1.73 Final         Passed - Valid encounter within last 6 months    Recent Outpatient Visits           2 weeks ago Essential hypertension   [place] [facility] at [facility] Jesse Sans, MD   4 months ago Type II diabetes mellitus with complication [facility])   [place] [facility] at [facility], Jesse Sans, MD   9  months ago Annual physical exam   [place] [facility] at [facility], Jesse Sans, MD   1 year ago Hyperlipidemia associated with type 2 diabetes mellitus [facility])   [place] [facility] at [facility], Jesse Sans, MD   1 year ago Type II diabetes mellitus with complication [facility])   [place] [facility] at [facility], Jesse Sans, MD       Future Appointments             In 3 months Army Melia, Jesse Sans, MD [facility] at [facility], [facility]

## 2021-12-15 NOTE — Telephone Encounter (Signed)
Requested Prescriptions  Pending Prescriptions Disp Refills   JARDIANCE 25 MG TABS tablet [Pharmacy Med Name: Jardiance 25 MG Oral Tablet] 90 tablet 3    Sig: TAKE 1 TABLET BY MOUTH DAILY  BEFORE BREAKFAST     Endocrinology:  Diabetes - SGLT2 Inhibitors Passed - 12/14/2021  9:44 AM      Passed - Cr in normal range and within 360 days    Creatinine, Ser  Date Value Ref Range Status  11/27/2021 0.77 0.57 - 1.00 mg/dL Final         Passed - HBA1C is between 0 and 7.9 and within 180 days    Hgb A1c MFr Bld  Date Value Ref Range Status  11/27/2021 6.8 (H) 4.8 - 5.6 % Final    Comment:             Prediabetes: 5.7 - 6.4          Diabetes: >6.4          Glycemic control for adults with diabetes: <7.0          Passed - eGFR in normal range and within 360 days    GFR calc Af Amer  Date Value Ref Range Status  01/28/2020 86 >59 mL/min/1.73 Final    Comment:    **In accordance with recommendations from the NKF-ASN Task force,**   [facility] is in the process of updating its eGFR calculation to the   2021 CKD-EPI creatinine equation that estimates kidney function   without a race variable.    GFR calc non Af Amer  Date Value Ref Range Status  01/28/2020 75 >59 mL/min/1.73 Final   eGFR  Date Value Ref Range Status  11/27/2021 88 >59 mL/min/1.73 Final         Passed - Valid encounter within last 6 months    Recent Outpatient Visits           2 weeks ago Essential hypertension   [place] [facility] at [facility], Jesse Sans, MD   4 months ago Type II diabetes mellitus with complication [facility])   [place] [facility] at [facility], Jesse Sans, MD   9 months ago Annual physical exam   [place] [facility] at [facility], Jesse Sans, MD   1 year ago Hyperlipidemia associated with type 2 diabetes mellitus [facility])   [place] [facility] at [facility], Jesse Sans, MD   1 year ago Type II diabetes mellitus with complication [facility])   [place] [facility] at [facility], Jesse Sans, MD       Future Appointments             In 3 months Army Melia Jesse Sans, MD [facility] at [place], [facility]             amLODipine (NORVASC) 5 MG tablet [Pharmacy Med Name: amLODIPine Besylate 5 MG Oral Tablet] 90 tablet 0    Sig: TAKE 1 TABLET BY MOUTH DAILY     Cardiovascular: Calcium Channel Blockers 2 Passed - 12/14/2021  9:44 AM      Passed - Last BP in normal range    BP Readings from Last 1 Encounters:  11/27/21 126/76         Passed - Last Heart Rate in normal range    Pulse Readings from Last 1 Encounters:  11/27/21 94         Passed - Valid encounter within last 6 months    Recent Outpatient Visits           2 weeks ago Essential hypertension   [place] [facility] at [facility], Jesse Sans, MD   4 months ago Type II diabetes mellitus with complication [facility])   [place] [facility] at [facility], Jesse Sans, MD   9 months ago Annual physical exam   [place] [facility] at [facility], Jesse Sans, MD   1 year ago Hyperlipidemia associated with type 2 diabetes mellitus [facility])   [place] [facility] Medicine at [facility], Jesse Sans, MD   1 year ago Type II diabetes mellitus with complication [facility])   [place] [facility] at [facility], Jesse Sans, MD       Future Appointments             In 3 months Army Melia Jesse Sans, MD [facility] Primary Care and [facility] Medicine at [facility], [facility]             olmesartan-hydrochlorothiazide (BENICAR HCT) 40-25 MG tablet [Pharmacy Med Name: Olmesartan Medoxomil-HCTZ 40-25 MG Oral Tablet] 90 tablet 1    Sig: TAKE 1 TABLET BY MOUTH DAILY      Cardiovascular: ARB + Diuretic Combos Passed - 12/14/2021  9:44 AM      Passed - K in normal range and within 180 days    Potassium  Date Value Ref Range Status  11/27/2021 4.4 3.5 - 5.2 mmol/L Final         Passed - Na in normal range and within 180 days    Sodium  Date Value Ref Range Status  11/27/2021 144 134 - 144 mmol/L Final         Passed - Cr in normal range and within 180 days    Creatinine, Ser  Date Value Ref Range Status  11/27/2021 0.77 0.57 - 1.00 mg/dL Final         Passed - eGFR is 10 or above and within 180 days    GFR calc Af Amer  Date Value Ref Range Status  01/28/2020 86 >59 mL/min/1.73 Final    Comment:    **In accordance with recommendations from the NKF-ASN Task force,**   [facility] is in the process of updating its eGFR calculation to the   2021 CKD-EPI creatinine equation that estimates kidney function   without a race variable.    GFR calc non Af Amer  Date Value Ref Range Status  01/28/2020 75 >59 mL/min/1.73 Final   eGFR  Date Value Ref Range Status  11/27/2021 88 >59 mL/min/1.73 Final         Passed - Patient is not pregnant      Passed - Last BP in normal range    BP Readings from Last 1 Encounters:  11/27/21 126/76         Passed - Valid encounter within last 6 months    Recent Outpatient Visits           2 weeks ago Essential hypertension   [place] [facility] at [facility], Jesse Sans, MD   4 months ago Type II diabetes mellitus with complication [facility])   [place] [facility] at [facility], Mickel Baas  H, MD   9 months ago Annual physical exam   [place] [facility] at [facility], Jesse Sans, MD   1 year ago Hyperlipidemia associated with type 2 diabetes mellitus [facility])   [place] [facility] at [facility], Jesse Sans, MD   1 year ago Type II diabetes mellitus with complication [facility])    [place] [facility] at [facility], Jesse Sans, MD       Future Appointments             In 3 months Army Melia, Jesse Sans, MD [place] [facility] Medicine at [facility], [facility]

## 2021-12-16 NOTE — Telephone Encounter (Signed)
Called and spoke with patient. Patient stated she has already spoke to [facility] and they said because she never received the order in September they will mail her out a 90 day refill free of charge since she was already charged for it in September.   - Jenessa Gillingham

## 2021-12-30 ENCOUNTER — Other Ambulatory Visit: Payer: Self-pay | Admitting: Internal Medicine

## 2021-12-30 DIAGNOSIS — E118 Type 2 diabetes mellitus with unspecified complications: Secondary | ICD-10-CM

## 2021-12-30 NOTE — Telephone Encounter (Signed)
Rx 11/04/21 #90- too soon Requested Prescriptions  Pending Prescriptions Disp Refills   JARDIANCE 25 MG TABS tablet [Pharmacy Med Name: Jardiance 25 MG Oral Tablet] 90 tablet 3    Sig: TAKE 1 TABLET BY MOUTH DAILY  BEFORE BREAKFAST     Endocrinology:  Diabetes - SGLT2 Inhibitors Passed - 12/30/2021  7:04 AM      Passed - Cr in normal range and within 360 days    Creatinine, Ser  Date Value Ref Range Status  11/27/2021 0.77 0.57 - 1.00 mg/dL Final         Passed - HBA1C is between 0 and 7.9 and within 180 days    Hgb A1c MFr Bld  Date Value Ref Range Status  11/27/2021 6.8 (H) 4.8 - 5.6 % Final    Comment:             Prediabetes: 5.7 - 6.4          Diabetes: >6.4          Glycemic control for adults with diabetes: <7.0          Passed - eGFR in normal range and within 360 days    GFR calc Af Amer  Date Value Ref Range Status  01/28/2020 86 >59 mL/min/1.73 Final    Comment:    **In accordance with recommendations from the NKF-ASN Task force,**   [facility] is in the process of updating its eGFR calculation to the   2021 CKD-EPI creatinine equation that estimates kidney function   without a race variable.    GFR calc non Af Amer  Date Value Ref Range Status  01/28/2020 75 >59 mL/min/1.73 Final   eGFR  Date Value Ref Range Status  11/27/2021 88 >59 mL/min/1.73 Final         Passed - Valid encounter within last 6 months    Recent Outpatient Visits           1 month ago Essential hypertension   [place] [facility] at [facility], Jesse Sans, MD   4 months ago Type II diabetes mellitus with complication [facility])   [place] [facility] at [facility], Jesse Sans, MD   9 months ago Annual physical exam   [place] [facility] at [facility], Jesse Sans, MD   1 year ago Hyperlipidemia associated with type 2 diabetes mellitus [facility])   [place] [facility]  Medicine at [facility], Jesse Sans, MD   1 year ago Type II diabetes mellitus with complication [facility])    Primary Care and Sports Medicine at [facility], Jesse Sans, MD       Future Appointments             In 2 months Army Melia, Jesse Sans, MD [facility] Primary [facility] at [facility], [facility]

## 2022-03-17 ENCOUNTER — Encounter: Payer: Self-pay | Admitting: Internal Medicine

## 2022-03-18 ENCOUNTER — Encounter: Payer: Self-pay | Admitting: Internal Medicine

## 2022-03-26 ENCOUNTER — Ambulatory Visit (INDEPENDENT_AMBULATORY_CARE_PROVIDER_SITE_OTHER): Payer: BC Managed Care – PPO | Admitting: Internal Medicine

## 2022-03-26 ENCOUNTER — Encounter: Payer: Self-pay | Admitting: Internal Medicine

## 2022-03-26 ENCOUNTER — Other Ambulatory Visit (HOSPITAL_COMMUNITY)
Admission: RE | Admit: 2022-03-26 | Discharge: 2022-03-26 | Disposition: A | Discharge status: Home / Self Care | Payer: BC Managed Care – PPO | Source: Ambulatory Visit | Attending: Internal Medicine | Admitting: Internal Medicine

## 2022-03-26 ENCOUNTER — Other Ambulatory Visit: Payer: Self-pay

## 2022-03-26 VITALS — BP 124/78 | HR 91 | Ht 65.0 in | Wt 279.2 lb

## 2022-03-26 DIAGNOSIS — N761 Subacute and chronic vaginitis: Secondary | ICD-10-CM | POA: Diagnosis not present

## 2022-03-26 DIAGNOSIS — Z124 Encounter for screening for malignant neoplasm of cervix: Secondary | ICD-10-CM | POA: Insufficient documentation

## 2022-03-26 DIAGNOSIS — Z1231 Encounter for screening mammogram for malignant neoplasm of breast: Secondary | ICD-10-CM | POA: Diagnosis not present

## 2022-03-26 DIAGNOSIS — I10 Essential (primary) hypertension: Secondary | ICD-10-CM | POA: Diagnosis not present

## 2022-03-26 DIAGNOSIS — B353 Tinea pedis: Secondary | ICD-10-CM

## 2022-03-26 DIAGNOSIS — E118 Type 2 diabetes mellitus with unspecified complications: Secondary | ICD-10-CM | POA: Diagnosis not present

## 2022-03-26 DIAGNOSIS — E785 Hyperlipidemia, unspecified: Secondary | ICD-10-CM | POA: Diagnosis not present

## 2022-03-26 DIAGNOSIS — Z0000 Encounter for general adult medical examination without abnormal findings: Secondary | ICD-10-CM

## 2022-03-26 DIAGNOSIS — E1169 Type 2 diabetes mellitus with other specified complication: Secondary | ICD-10-CM | POA: Diagnosis not present

## 2022-03-26 MED ORDER — CLOTRIMAZOLE-BETAMETHASONE 1-0.05 % EX CREA: 1.0000 | TOPICAL_CREAM | Freq: Every day | CUTANEOUS | 2 refills | Status: DC

## 2022-03-26 NOTE — Addendum Note (Signed)
Addended by: Glean Hess on: 03/26/2022 12:44 PM   Modules accepted: Level of Service

## 2022-03-26 NOTE — Progress Notes (Signed)
Date:  03/26/2022   Name:  Anita Krueger   DOB:  [birth year]   MRN:  [id number]   Chief Complaint: Annual Exam Anita Krueger is a 62 y.o. female who presents today for her Complete Annual Exam. She feels well. She reports exercising - none. She reports she is sleeping fairly well. Breast complaints - none.  Mammogram: 04/2021 DEXA: none Pap smear: 01/2017 neg/neg Colonoscopy: 03/2018 repeat 10 yrs  Health Maintenance Due  Topic Date Due   DTaP/Tdap/Td (1 - Tdap) Never done   Zoster Vaccines- Shingrix (1 of 2) Never done   PAP SMEAR-Modifier  01/14/2022   Diabetic kidney evaluation - Urine ACR  03/13/2022    Immunization History  Administered Date(s) Administered   COVID-19, mRNA, vaccine(Comirnaty)12 years and older 11/27/2021   Influenza,inj,Quad PF,6+ Mos 01/14/2017, 12/15/2017, 12/29/2018, 11/02/2019, 11/07/2020, 11/27/2021   Influenza-Unspecified 12/10/2014, 12/09/2017   PFIZER(Purple Top)SARS-COV-2 Vaccination 05/08/2019, 05/29/2019   Pneumococcal Polysaccharide-23 09/23/2017    Hypertension This is a chronic problem. The problem is controlled. Pertinent negatives include no chest pain, headaches, palpitations or shortness of breath. Past treatments include calcium channel blockers, angiotensin blockers and diuretics. There is no history of kidney disease, CAD/MI or CVA.  Diabetes She presents for her follow-up diabetic visit. She has type 2 diabetes mellitus. Her disease course has been stable. Pertinent negatives for hypoglycemia include no dizziness, headaches, nervousness/anxiousness or tremors. Pertinent negatives for diabetes include no chest pain, no fatigue, no polydipsia and no polyuria. Pertinent negatives for diabetic complications include no CVA. Current diabetic treatments: metformin and jardiance.  Hyperlipidemia This is a chronic problem. The problem is controlled. Pertinent negatives include no chest pain or shortness of breath. Current antihyperlipidemic  treatment includes statins. The current treatment provides significant improvement of lipids.    Lab Results  Component Value Date   NA 144 11/27/2021   K 4.4 11/27/2021   CO2 28 11/27/2021   GLUCOSE 124 (H) 11/27/2021   BUN 25 11/27/2021   CREATININE 0.77 11/27/2021   CALCIUM 10.3 11/27/2021   EGFR 88 11/27/2021   GFRNONAA 75 01/28/2020   Lab Results  Component Value Date   CHOL 120 11/27/2021   HDL 34 (L) 11/27/2021   LDLCALC 64 11/27/2021   TRIG 124 11/27/2021   CHOLHDL 3.5 11/27/2021   Lab Results  Component Value Date   TSH 1.270 03/13/2021   Lab Results  Component Value Date   HGBA1C 6.8 (H) 11/27/2021   Lab Results  Component Value Date   WBC 9.6 03/13/2021   HGB 14.4 03/13/2021   HCT 41.0 03/13/2021   MCV 87 03/13/2021   PLT 240 03/13/2021   Lab Results  Component Value Date   ALT 49 (H) 11/27/2021   AST 39 11/27/2021   ALKPHOS 91 11/27/2021   BILITOT 0.3 11/27/2021   No results found for: "25OHVITD2", "25OHVITD3", "VD25OH"   Review of Systems  Constitutional:  Negative for chills, fatigue and fever.  HENT:  Negative for congestion, hearing loss, tinnitus, trouble swallowing and voice change.   Eyes:  Negative for pain and visual disturbance.  Respiratory:  Negative for cough, chest tightness, shortness of breath and wheezing.   Cardiovascular:  Negative for chest pain, palpitations and leg swelling.  Gastrointestinal:  Negative for abdominal pain, constipation, diarrhea and vomiting.  Endocrine: Negative for polydipsia and polyuria.  Genitourinary:  Negative for dysuria, frequency, genital sores, vaginal bleeding and vaginal discharge.       Vaginal and labial itching  Musculoskeletal:  Negative for arthralgias, gait problem and joint swelling.  Skin:  Positive for color change (redness and rash on feet). Negative for rash.  Neurological:  Negative for dizziness, tremors, light-headedness and headaches.  Hematological:  Negative for adenopathy.  Does not bruise/bleed easily.  Psychiatric/Behavioral:  Negative for dysphoric mood and sleep disturbance. The patient is not nervous/anxious.     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   Menorrhagia with irregular cycle 01/19/2017   Hip pain, acute, left 05/12/2016   Essential hypertension 04/04/2015   Type II diabetes mellitus with complication ([place]) 0000000   Hyperlipidemia associated with type 2 diabetes mellitus ([place]) 09/29/2014    No Known Allergies  Past Surgical History:  Procedure Laterality Date   bell's palsy  2016   right eye sluggish   COLONOSCOPY WITH PROPOFOL N/A 03/31/2018   Procedure: COLONOSCOPY WITH PROPOFOL;  Surgeon: Jonathon Bellows, MD;  Location[facility]COPY;  Service: Gastroenterology;  Laterality: N/A;   DILATATION & CURETTAGE/HYSTEROSCOPY WITH MYOSURE N/A 03/31/2017   Procedure: DILATATION & CURETTAGE/HYSTEROSCOPY WITH MYOSURE;  Surgeon: [name], MD;  Location: [facility] ORS;  Service: Gynecology;  Laterality: N/A;   LAPAROSCOPY      Social History   Tobacco Use   Smoking status: Former    Types: Cigarettes   Smokeless tobacco: Never   Tobacco comments:    40 years ago  Substance Use Topics   Alcohol use: Yes    Alcohol/week: 2.0 standard drinks of alcohol    Types: 2 Shots of liquor per week    Comment: occasional   Drug use: No     Medication list has been reviewed and updated.  Current Meds  Medication Sig   Acetaminophen (TYLENOL PO) Take by mouth as needed.   amLODipine (NORVASC) 5 MG tablet TAKE 1 TABLET BY MOUTH DAILY   Blood Glucose Monitoring Suppl (CONTOUR NEXT MONITOR) w/Device KIT USE AS DIRECTED   Carboxymethylcellul-Glycerin 1-0.25 % SOLN Place 1 drop into both eyes 3 (three) times daily as needed (dry eyes).    carboxymethylcellulose (REFRESH PLUS) 0.5 % SOLN Place 1 drop into both eyes 3 (three) times daily as needed (dry eyes).   clotrimazole-betamethasone (LOTRISONE) cream Apply 1 Application topically daily.   Ferrous Sulfate (IRON PO) Take by mouth daily.   glucose blood (ONETOUCH ULTRA) test strip USE 1 STRIP UP TO 4 TIMES  DAILY AS DIRECTED   JARDIANCE 25 MG TABS tablet TAKE 1 TABLET BY MOUTH DAILY  BEFORE BREAKFAST   Lancets (ONETOUCH DELICA PLUS 123XX123) MISC 1 each by Does not apply route in the morning and at bedtime.   loratadine (CLARITIN) 10 MG tablet Take 10 mg by mouth daily as needed for allergies.   metFORMIN (GLUCOPHAGE-XR) 500 MG 24 hr tablet TAKE 1 TABLET BY MOUTH TWICE  DAILY BEFORE A MEAL   Multiple Vitamins-Minerals (DAILY MULTIVITAMIN PO) Take 2 tablets by mouth daily.   olmesartan-hydrochlorothiazide (BENICAR HCT) 40-25 MG tablet TAKE 1 TABLET BY MOUTH DAILY   Probiotic Product (PROBIOTIC PO) Take by mouth daily.   rosuvastatin (CRESTOR) 10 MG tablet Take 1 tablet (10 mg total) by mouth daily.   UNABLE TO FIND as needed. Med Name: biofreeze       03/26/2022    9:40 AM 11/27/2021    9:47 AM 08/07/2021   11:04 AM  03/13/2021    9:15 AM  GAD 7 : Generalized Anxiety Score  Nervous, Anxious, on Edge 1 1 1 1  $ Control/stop worrying 1 1 0 1  Worry too much - different things 1 1 1 $ 0  Trouble relaxing 1 1 0 0  Restless 1 1 1 $ 0  Easily annoyed or irritable 1 1 1 $ 0  Afraid - awful might happen 1 1 1 $ 0  Total GAD 7 Score 7 7 5 2  $ Anxiety Difficulty Somewhat difficult Somewhat difficult Not difficult at all Not difficult at all       03/26/2022    9:40 AM 11/27/2021    9:47 AM 08/07/2021   11:04 AM  Depression screen PHQ 2/9  Decreased Interest 0 1 1  Down, Depressed, Hopeless 1 1 2  $ PHQ - 2 Score 1 2 3  $ Altered sleeping 2 2 2  $ Tired, decreased energy 1 2 2  $ Change in appetite 3 2 1  $ Feeling bad or failure about yourself  1 1 2  $ Trouble concentrating 1 1 1  $ Moving  slowly or fidgety/restless 1 1 0  Suicidal thoughts 0 0   PHQ-9 Score 10 11 11  $ Difficult doing work/chores Somewhat difficult Somewhat difficult Very difficult    BP Readings from Last 3 Encounters:  03/26/22 124/78  11/27/21 126/76  08/07/21 128/70    Physical Exam Vitals and nursing note reviewed.  Constitutional:      General: She is not in acute distress.    Appearance: She is well-developed.  HENT:     Head: Normocephalic and atraumatic.     Right Ear: Tympanic membrane and ear canal normal.     Left Ear: Tympanic membrane and ear canal normal.     Nose:     Right Sinus: No maxillary sinus tenderness.     Left Sinus: No maxillary sinus tenderness.  Eyes:     General: No scleral icterus.       Right eye: No discharge.        Left eye: No discharge.     Conjunctiva/sclera: Conjunctivae normal.  Neck:     Thyroid: No thyromegaly.     Vascular: No carotid bruit.  Cardiovascular:     Rate and Rhythm: Normal rate and regular rhythm.     Pulses: Normal pulses.     Heart sounds: Normal heart sounds.  Pulmonary:     Effort: Pulmonary effort is normal. No respiratory distress.     Breath sounds: No wheezing.  Chest:  Breasts:    Right: No mass, nipple discharge, skin change or tenderness.     Left: No mass, nipple discharge, skin change or tenderness.  Abdominal:     General: Bowel sounds are normal.     Palpations: Abdomen is soft.     Tenderness: There is no abdominal tenderness.  Genitourinary:    Labia:        Right: No tenderness, lesion or injury.        Left: No tenderness, lesion or injury.      Vagina: Tenderness present.     Cervix: Normal.     Uterus: Normal.      Adnexa: Right adnexa normal and left adnexa normal.     Comments: Pap and Aptima obtained Musculoskeletal:     Cervical back: Normal range of motion. No erythema.     Right lower leg: No edema.     Left lower leg: No edema.  Lymphadenopathy:     Cervical: No cervical  adenopathy.  Skin:     General: Skin is warm and dry.     Findings: Erythema and rash present.     Comments: Both feet  Neurological:     General: No focal deficit present.     Mental Status: She is alert and oriented to person, place, and time.     Cranial Nerves: No cranial nerve deficit.     Sensory: No sensory deficit.     Deep Tendon Reflexes: Reflexes are normal and symmetric.  Psychiatric:        Attention and Perception: Attention normal.        Mood and Affect: Mood normal.    Diabetic Foot Exam - Simple   Simple Foot Form Diabetic Foot exam was performed with the following findings: Yes 03/26/2022 10:07 AM  Visual Inspection No deformities, no ulcerations, no other skin breakdown bilaterally: Yes Sensation Testing Intact to touch and monofilament testing bilaterally: Yes Pulse Check Posterior Tibialis and Dorsalis pulse intact bilaterally: Yes Comments Skin red with peeling on both feet      Wt Readings from Last 3 Encounters:  03/26/22 279 lb 3.2 oz (126.6 kg)  11/27/21 275 lb (124.7 kg)  08/07/21 276 lb (125.2 kg)    BP 124/78   Pulse 91   Ht 5' 5"$  (1.651 m)   Wt 279 lb 3.2 oz (126.6 kg)   LMP 05/22/2018 (Exact Date)   SpO2 96%   BMI 46.46 kg/m   Assessment and Plan: Problem List Items Addressed This Visit       Cardiovascular and Mediastinum   Essential hypertension (Chronic)    Clinically stable exam with well controlled BP on amlodipine, losartan and hctz. Tolerating medications without side effects. Pt to continue current regimen and low sodium diet.       Relevant Orders   CBC with Differential/Platelet   Comprehensive metabolic panel   TSH     Endocrine   Hyperlipidemia associated with type 2 diabetes mellitus ([place]) (Chronic)    Tolerating statin medications without concerns LDL is  Lab Results  Component Value Date   LDLCALC 64 11/27/2021  with a goal of < 70. Current dose will be adjusted if needed.       Relevant Orders   Lipid panel   Type II  diabetes mellitus with complication (HCC) (Chronic)    Clinically stable without s/s of hypoglycemia. Tolerating metformin and jardiance well without side effects or other concerns. Lab Results  Component Value Date   HGBA1C 6.8 (H) 11/27/2021        Relevant Orders   Hemoglobin A1c   Microalbumin / creatinine urine ratio   Other Visit Diagnoses     Annual physical exam    -  Primary   Normal exam except for weight encourage healthy diet consider Rybelsus or Ozempic if needed for DM to help with weight   Relevant Orders   CBC with Differential/Platelet   Comprehensive metabolic panel   Hemoglobin A1c   Lipid panel   TSH   Encounter for screening mammogram for breast cancer       schedule [facility]   Relevant Orders   MM 3D SCREEN BREAST BILATERAL   Encounter for screening for cervical cancer       Relevant Orders   Cytology - PAP   Tinea pedis of both feet       Relevant Medications   clotrimazole-betamethasone (LOTRISONE) cream   Subacute vaginitis       suspect candida but  will wait for testing to confirm   Relevant Orders   Cervicovaginal ancillary only        Partially dictated using Dragon software. Any errors are unintentional.  Halina Maidens, MD [facility]  03/26/2022

## 2022-03-26 NOTE — Assessment & Plan Note (Signed)
Tolerating statin medications without concerns LDL is  Lab Results  Component Value Date   LDLCALC 64 11/27/2021   with a goal of < 70. Current dose will be adjusted if needed.

## 2022-03-26 NOTE — Patient Instructions (Signed)
Call [facility] to schedule your mammogram at [phone].  

## 2022-03-26 NOTE — Assessment & Plan Note (Signed)
Clinically stable exam with well controlled BP on amlodipine, losartan and hctz. Tolerating medications without side effects. Pt to continue current regimen and low sodium diet.

## 2022-03-26 NOTE — Assessment & Plan Note (Signed)
Clinically stable without s/s of hypoglycemia. Tolerating metformin and jardiance well without side effects or other concerns. Lab Results  Component Value Date   HGBA1C 6.8 (H) 11/27/2021

## 2022-03-28 LAB — COMPREHENSIVE METABOLIC PANEL
ALT: 39 IU/L — ABNORMAL HIGH (ref 0–32)
AST: 32 IU/L (ref 0–40)
Albumin/Globulin Ratio: 2 (ref 1.2–2.2)
Albumin: 5 g/dL — ABNORMAL HIGH (ref 3.8–4.9)
Alkaline Phosphatase: 85 IU/L (ref 44–121)
BUN/Creatinine Ratio: 28 (ref 12–28)
BUN: 24 mg/dL (ref 8–27)
Bilirubin Total: 0.3 mg/dL (ref 0.0–1.2)
CO2: 25 mmol/L (ref 20–29)
Calcium: 11 mg/dL — ABNORMAL HIGH (ref 8.7–10.3)
Chloride: 100 mmol/L (ref 96–106)
Creatinine, Ser: 0.85 mg/dL (ref 0.57–1.00)
Globulin, Total: 2.5 g/dL (ref 1.5–4.5)
Glucose: 126 mg/dL — ABNORMAL HIGH (ref 70–99)
Potassium: 4.4 mmol/L (ref 3.5–5.2)
Sodium: 144 mmol/L (ref 134–144)
Total Protein: 7.5 g/dL (ref 6.0–8.5)
eGFR: 78 mL/min/{1.73_m2} (ref >59)

## 2022-03-28 LAB — LIPID PANEL
Chol/HDL Ratio: 3.6 ratio (ref 0.0–4.4)
Cholesterol, Total: 134 mg/dL (ref 100–199)
HDL: 37 mg/dL — ABNORMAL LOW (ref >39)
LDL Chol Calc (NIH): 74 mg/dL (ref 0–99)
Triglycerides: 128 mg/dL (ref 0–149)
VLDL Cholesterol Cal: 23 mg/dL (ref 5–40)

## 2022-03-28 LAB — CBC WITH DIFFERENTIAL/PLATELET
Basophils Absolute: 0.1 10*3/uL (ref 0.0–0.2)
Basos: 1 %
EOS (ABSOLUTE): 0.3 10*3/uL (ref 0.0–0.4)
Eos: 3 %
Hematocrit: 44.7 % (ref 34.0–46.6)
Hemoglobin: 15.1 g/dL (ref 11.1–15.9)
Immature Grans (Abs): 0 10*3/uL (ref 0.0–0.1)
Immature Granulocytes: 0 %
Lymphocytes Absolute: 2.9 10*3/uL (ref 0.7–3.1)
Lymphs: 29 %
MCH: 28.9 pg (ref 26.6–33.0)
MCHC: 33.8 g/dL (ref 31.5–35.7)
MCV: 86 fL (ref 79–97)
Monocytes Absolute: 0.6 10*3/uL (ref 0.1–0.9)
Monocytes: 6 %
Neutrophils Absolute: 6.2 10*3/uL (ref 1.4–7.0)
Neutrophils: 61 %
Platelets: 257 10*3/uL (ref 150–450)
RBC: 5.22 x10E6/uL (ref 3.77–5.28)
RDW: 13.7 % (ref 11.7–15.4)
WBC: 10 10*3/uL (ref 3.4–10.8)

## 2022-03-28 LAB — HEMOGLOBIN A1C
Est. average glucose Bld gHb Est-mCnc: 151 mg/dL
Hgb A1c MFr Bld: 6.9 % — ABNORMAL HIGH (ref 4.8–5.6)

## 2022-03-28 LAB — MICROALBUMIN / CREATININE URINE RATIO
Creatinine, Urine: 69.2 mg/dL
Microalb/Creat Ratio: 5 mg/g creat (ref 0–29)
Microalbumin, Urine: 3.4 ug/mL

## 2022-03-28 LAB — TSH: TSH: 1.38 u[IU]/mL (ref 0.450–4.500)

## 2022-03-29 LAB — CERVICOVAGINAL ANCILLARY ONLY
Bacterial Vaginitis (gardnerella): NEGATIVE
Candida Glabrata: NEGATIVE
Candida Vaginitis: NEGATIVE
Comment: NEGATIVE
Comment: NEGATIVE
Comment: NEGATIVE
Comment: NEGATIVE
Trichomonas: NEGATIVE

## 2022-03-30 ENCOUNTER — Other Ambulatory Visit: Payer: Self-pay

## 2022-03-30 LAB — CYTOLOGY - PAP
Comment: NEGATIVE
Diagnosis: NEGATIVE
High risk HPV: NEGATIVE

## 2022-05-07 ENCOUNTER — Ambulatory Visit
Admission: RE | Admit: 2022-05-07 | Discharge: 2022-05-07 | Disposition: A | Discharge status: Home / Self Care | Payer: BC Managed Care – PPO | Source: Ambulatory Visit | Attending: Internal Medicine | Admitting: Internal Medicine

## 2022-05-07 DIAGNOSIS — Z1231 Encounter for screening mammogram for malignant neoplasm of breast: Secondary | ICD-10-CM | POA: Diagnosis not present

## 2022-05-13 ENCOUNTER — Other Ambulatory Visit: Payer: Self-pay | Admitting: Internal Medicine

## 2022-05-13 NOTE — Telephone Encounter (Signed)
Requested Prescriptions  Pending Prescriptions Disp Refills   olmesartan-hydrochlorothiazide (BENICAR HCT) 40-25 MG tablet [Pharmacy Med Name: Olmesartan Medoxomil-HCTZ 40-25 MG Oral Tablet] 90 tablet 3    Sig: TAKE 1 TABLET BY MOUTH DAILY     Cardiovascular: ARB + Diuretic Combos Passed - 05/13/2022  4:43 AM      Passed - K in normal range and within 180 days    Potassium  Date Value Ref Range Status  03/26/2022 4.4 3.5 - 5.2 mmol/L Final         Passed - Na in normal range and within 180 days    Sodium  Date Value Ref Range Status  03/26/2022 144 134 - 144 mmol/L Final         Passed - Cr in normal range and within 180 days    Creatinine, Ser  Date Value Ref Range Status  03/26/2022 0.85 0.57 - 1.00 mg/dL Final         Passed - eGFR is 10 or above and within 180 days    GFR calc Af Amer  Date Value Ref Range Status  01/28/2020 86 >59 mL/min/1.73 Final    Comment:    **In accordance with recommendations from the NKF-ASN Task force,**   [facility] is in the process of updating its eGFR calculation to the   2021 CKD-EPI creatinine equation that estimates kidney function   without a race variable.    GFR calc non Af Amer  Date Value Ref Range Status  01/28/2020 75 >59 mL/min/1.73 Final   eGFR  Date Value Ref Range Status  03/26/2022 78 >59 mL/min/1.73 Final         Passed - Patient is not pregnant      Passed - Last BP in normal range    BP Readings from Last 1 Encounters:  03/26/22 124/78         Passed - Valid encounter within last 6 months    Recent Outpatient Visits           1 month ago Annual physical exam   [place] [facility] at [facility], Jesse Sans, MD   5 months ago Essential hypertension   [place] at [facility], Jesse Sans, MD   9 months ago Type II diabetes mellitus with complication [facility])   [place] [facility] at [facility], Jesse Sans, MD   10 months ago    [place] at [facility], Jesse Sans, MD   1 year ago Annual physical exam   [place] at [facility], Jesse Sans, MD       Future Appointments             In 3 months Army Melia, Jesse Sans, MD [place] at [facility], [facility]   In 10 months Army Melia, Jesse Sans, MD [place] at [facility], [facility]

## 2022-05-19 ENCOUNTER — Other Ambulatory Visit: Payer: Self-pay | Admitting: Internal Medicine

## 2022-05-19 DIAGNOSIS — E118 Type 2 diabetes mellitus with unspecified complications: Secondary | ICD-10-CM

## 2022-05-20 NOTE — Telephone Encounter (Signed)
Requested Prescriptions  Pending Prescriptions Disp Refills   JARDIANCE 25 MG TABS tablet [Pharmacy Med Name: Jardiance 25 MG Oral Tablet] 90 tablet 1    Sig: TAKE 1 TABLET BY MOUTH DAILY  BEFORE BREAKFAST     Endocrinology:  Diabetes - SGLT2 Inhibitors Passed - 05/19/2022  2:45 PM      Passed - Cr in normal range and within 360 days    Creatinine, Ser  Date Value Ref Range Status  03/26/2022 0.85 0.57 - 1.00 mg/dL Final         Passed - HBA1C is between 0 and 7.9 and within 180 days    Hgb A1c MFr Bld  Date Value Ref Range Status  03/26/2022 6.9 (H) 4.8 - 5.6 % Final    Comment:             Prediabetes: 5.7 - 6.4          Diabetes: >6.4          Glycemic control for adults with diabetes: <7.0          Passed - eGFR in normal range and within 360 days    GFR calc Af Amer  Date Value Ref Range Status  01/28/2020 86 >59 mL/min/1.73 Final    Comment:    **In accordance with recommendations from the NKF-ASN Task force,**   [facility] is in the process of updating its eGFR calculation to the   2021 CKD-EPI creatinine equation that estimates kidney function   without a race variable.    GFR calc non Af Amer  Date Value Ref Range Status  01/28/2020 75 >59 mL/min/1.73 Final   eGFR  Date Value Ref Range Status  03/26/2022 78 >59 mL/min/1.73 Final         Passed - Valid encounter within last 6 months    Recent Outpatient Visits           1 month ago Annual physical exam   [place] [facility] at [facility], Nyoka Cowden, MD   5 months ago Essential hypertension   [place] [facility] at [facility], Nyoka Cowden, MD   9 months ago Type II diabetes mellitus with complication [facility])    Primary Care & Sports Medicine at [facility], Nyoka Cowden, MD   10 months ago    [facility] at [facility], Nyoka Cowden, MD   1 year ago Annual physical exam   [facility]  Health Primary Care & Sports Medicine at [facility], Nyoka Cowden, MD       Future Appointments             In 2 months Judithann Graves, Nyoka Cowden, MD [facility] Medicine at [facility], [facility]   In 10 months Judithann Graves, Nyoka Cowden, MD [facility] Primary Care & Sports Medicine at [facility], [facility]

## 2022-05-29 ENCOUNTER — Other Ambulatory Visit: Payer: Self-pay | Admitting: Internal Medicine

## 2022-05-29 DIAGNOSIS — I10 Essential (primary) hypertension: Secondary | ICD-10-CM

## 2022-06-30 DIAGNOSIS — H35033 Hypertensive retinopathy, bilateral: Secondary | ICD-10-CM | POA: Diagnosis not present

## 2022-06-30 LAB — HM DIABETES EYE EXAM

## 2022-07-02 ENCOUNTER — Encounter: Payer: Self-pay | Admitting: Internal Medicine

## 2022-08-13 ENCOUNTER — Ambulatory Visit (INDEPENDENT_AMBULATORY_CARE_PROVIDER_SITE_OTHER): Payer: BC Managed Care – PPO | Admitting: Internal Medicine

## 2022-08-13 ENCOUNTER — Encounter: Payer: Self-pay | Admitting: Internal Medicine

## 2022-08-13 VITALS — BP 118/72 | HR 92 | Ht 65.0 in | Wt 280.0 lb

## 2022-08-13 DIAGNOSIS — E118 Type 2 diabetes mellitus with unspecified complications: Secondary | ICD-10-CM

## 2022-08-13 DIAGNOSIS — E1169 Type 2 diabetes mellitus with other specified complication: Secondary | ICD-10-CM

## 2022-08-13 DIAGNOSIS — E785 Hyperlipidemia, unspecified: Secondary | ICD-10-CM

## 2022-08-13 DIAGNOSIS — I10 Essential (primary) hypertension: Secondary | ICD-10-CM | POA: Diagnosis not present

## 2022-08-13 DIAGNOSIS — Z7984 Long term (current) use of oral hypoglycemic drugs: Secondary | ICD-10-CM

## 2022-08-13 MED ORDER — EMPAGLIFLOZIN 25 MG PO TABS: 25.0000 mg | ORAL_TABLET | Freq: Every day | ORAL | 3 refills | Status: DC

## 2022-08-13 MED ORDER — ROSUVASTATIN CALCIUM 10 MG PO TABS: 10.0000 mg | ORAL_TABLET | Freq: Every day | ORAL | 3 refills | Status: DC

## 2022-08-13 NOTE — Assessment & Plan Note (Signed)
Now off calcium supplements Check PTH and Ca and advise

## 2022-08-13 NOTE — Assessment & Plan Note (Addendum)
Stable exam with well controlled BP.  Currently taking amlodipine and valsartan with hctz. Tolerating medications without concerns or side effects. Elevated calcium last visit - will recheck today and stop hydrochlorothiazide if still elevated. However, she does have mild LE edema and will need at least a PRN diuretic. Will continue to recommend low sodium diet and current regimen.

## 2022-08-13 NOTE — Progress Notes (Signed)
Date:  08/13/2022   Name:  Anita Krueger   DOB:  02/11/1960   MRN:  [id number]   Chief Complaint: Diabetes and Hypertension  Diabetes She presents for her follow-up diabetic visit. She has type 2 diabetes mellitus. Her disease course has been stable. Pertinent negatives for hypoglycemia include no headaches or tremors. Pertinent negatives for diabetes include no chest pain, no fatigue, no polydipsia and no polyuria. Current diabetic treatment includes oral agent (dual therapy).  Hypertension This is a chronic problem. The problem is controlled. Pertinent negatives include no chest pain, headaches, palpitations or shortness of breath. Past treatments include calcium channel blockers, angiotensin blockers and diuretics.  Hypercalcemia - noted last visit to be 11.  Calcium supplements stopped.  Will recheck and consider stopping HCTZ.  Lab Results  Component Value Date   NA 144 03/26/2022   K 4.4 03/26/2022   CO2 25 03/26/2022   GLUCOSE 126 (H) 03/26/2022   BUN 24 03/26/2022   CREATININE 0.85 03/26/2022   CALCIUM 11.0 (H) 03/26/2022   EGFR 78 03/26/2022   GFRNONAA 75 01/28/2020   Lab Results  Component Value Date   CHOL 134 03/26/2022   HDL 37 (L) 03/26/2022   LDLCALC 74 03/26/2022   TRIG 128 03/26/2022   CHOLHDL 3.6 03/26/2022   Lab Results  Component Value Date   TSH 1.380 03/26/2022   Lab Results  Component Value Date   HGBA1C 6.9 (H) 03/26/2022   Lab Results  Component Value Date   WBC 10.0 03/26/2022   HGB 15.1 03/26/2022   HCT 44.7 03/26/2022   MCV 86 03/26/2022   PLT 257 03/26/2022   Lab Results  Component Value Date   ALT 39 (H) 03/26/2022   AST 32 03/26/2022   ALKPHOS 85 03/26/2022   BILITOT 0.3 03/26/2022   No results found for: "25OHVITD2", "25OHVITD3", "VD25OH"   Review of Systems  Constitutional:  Negative for appetite change, fatigue, fever and unexpected weight change.  HENT:  Negative for tinnitus and trouble swallowing.   Eyes:  Negative  for visual disturbance.  Respiratory:  Negative for cough, chest tightness and shortness of breath.   Cardiovascular:  Negative for chest pain, palpitations and leg swelling.  Gastrointestinal:  Negative for abdominal pain.  Endocrine: Negative for polydipsia and polyuria.  Genitourinary:  Negative for dysuria and hematuria.  Musculoskeletal:  Negative for arthralgias.  Neurological:  Negative for tremors, numbness and headaches.  Psychiatric/Behavioral:  Negative for dysphoric mood.     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   Menorrhagia with irregular cycle 01/19/2017   Hip pain, acute, left 05/12/2016   Essential hypertension 04/04/2015   Type II diabetes mellitus with complication (HCC) 01/27/2015   Hyperlipidemia associated with type 2 diabetes mellitus (HCC) 09/29/2014    No Known Allergies  Past Surgical History:  Procedure Laterality Date   bell's palsy  2016   right eye sluggish   COLONOSCOPY WITH PROPOFOL N/A 03/31/2018   Procedure: COLONOSCOPY WITH PROPOFOL;  Surgeon: Wyline Mood, MD;  Location: [facility] ENDOSCOPY;  Service: Gastroenterology;  Laterality: N/A;   DILATATION & CURETTAGE/HYSTEROSCOPY WITH MYOSURE N/A 03/31/2017   Procedure: DILATATION & CURETTAGE/HYSTEROSCOPY WITH MYOSURE;  Surgeon: Conard Novak, MD;  Location: [facility] ORS;  Service: Gynecology;  Laterality: N/A;   LAPAROSCOPY  Social History   Tobacco Use   Smoking status: Former    Types: Cigarettes   Smokeless tobacco: Never   Tobacco comments:    40 years ago  Substance Use Topics   Alcohol use: Yes    Alcohol/week: 2.0 standard drinks of alcohol    Types: 2 Shots of liquor per week    Comment: occasional   Drug use: No     Medication list has been reviewed and  updated.  Current Meds  Medication Sig   Acetaminophen (TYLENOL PO) Take by mouth as needed.   amLODipine (NORVASC) 5 MG tablet TAKE 1 TABLET BY MOUTH DAILY   Blood Glucose Monitoring Suppl (CONTOUR NEXT MONITOR) w/Device KIT USE AS DIRECTED   Carboxymethylcellul-Glycerin 1-0.25 % SOLN Place 1 drop into both eyes 3 (three) times daily as needed (dry eyes).   carboxymethylcellulose (REFRESH PLUS) 0.5 % SOLN Place 1 drop into both eyes 3 (three) times daily as needed (dry eyes).   clotrimazole-betamethasone (LOTRISONE) cream Apply 1 Application topically daily.   Ferrous Sulfate (IRON PO) Take by mouth daily.   glucose blood (ONETOUCH ULTRA) test strip USE 1 STRIP UP TO 4 TIMES  DAILY AS DIRECTED   Lancets (ONETOUCH DELICA PLUS LANCET33G) MISC 1 each by Does not apply route in the morning and at bedtime.   loratadine (CLARITIN) 10 MG tablet Take 10 mg by mouth daily as needed for allergies.   metFORMIN (GLUCOPHAGE-XR) 500 MG 24 hr tablet TAKE 1 TABLET BY MOUTH TWICE  DAILY BEFORE A MEAL   olmesartan-hydrochlorothiazide (BENICAR HCT) 40-25 MG tablet TAKE 1 TABLET BY MOUTH DAILY   Probiotic Product (PROBIOTIC PO) Take by mouth daily.   UNABLE TO FIND as needed. Med Name: biofreeze   [DISCONTINUED] empagliflozin (JARDIANCE) 25 MG TABS tablet TAKE 1 TABLET BY MOUTH DAILY  BEFORE BREAKFAST   [DISCONTINUED] Multiple Vitamins-Minerals (DAILY MULTIVITAMIN PO) Take 2 tablets by mouth daily.   [DISCONTINUED] rosuvastatin (CRESTOR) 10 MG tablet Take 1 tablet (10 mg total) by mouth daily.       08/13/2022    9:32 AM 03/26/2022    9:40 AM 11/27/2021    9:47 AM 08/07/2021   11:04 AM  GAD 7 : Generalized Anxiety Score  Nervous, Anxious, on Edge 1 1 1 1   Control/stop worrying 1 1 1  0  Worry too much - different things 1 1 1 1   Trouble relaxing 1 1 1  0  Restless 1 1 1 1   Easily annoyed or irritable 1 1 1 1   Afraid - awful might happen 1 1 1 1   Total GAD 7 Score 7 7 7 5   Anxiety Difficulty Somewhat  difficult Somewhat difficult Somewhat difficult Not difficult at all       08/13/2022    9:32 AM 03/26/2022    9:40 AM 11/27/2021    9:47 AM  Depression screen PHQ 2/9  Decreased Interest 1 0 1  Down, Depressed, Hopeless 0 1 1  PHQ - 2 Score 1 1 2   Altered sleeping 2 2 2   Tired, decreased energy 2 1 2   Change in appetite 2 3 2   Feeling bad or failure about yourself  1 1 1   Trouble concentrating 1 1 1   Moving slowly or fidgety/restless 2 1 1   Suicidal thoughts 0 0 0  PHQ-9 Score 11 10 11   Difficult doing work/chores Not difficult at all Somewhat difficult Somewhat difficult    BP Readings from Last 3 Encounters:  08/13/22 118/72  03/26/22 124/78  11/27/21  126/76    Physical Exam Vitals and nursing note reviewed.  Constitutional:      General: She is not in acute distress.    Appearance: She is well-developed.  HENT:     Head: Normocephalic and atraumatic.  Cardiovascular:     Rate and Rhythm: Normal rate and regular rhythm.     Heart sounds: No murmur heard. Pulmonary:     Effort: Pulmonary effort is normal. No respiratory distress.     Breath sounds: No wheezing or rhonchi.  Skin:    General: Skin is warm and dry.     Findings: No rash.  Neurological:     Mental Status: She is alert and oriented to person, place, and time.  Psychiatric:        Mood and Affect: Mood normal.        Behavior: Behavior normal.     Wt Readings from Last 3 Encounters:  08/13/22 280 lb (127 kg)  03/26/22 279 lb 3.2 oz (126.6 kg)  11/27/21 275 lb (124.7 kg)    BP 118/72   Pulse 92   Ht 5\' 5"  (1.651 m)   Wt 280 lb (127 kg)   LMP 05/22/2018 (Exact Date)   SpO2 94%   BMI 46.59 kg/m   Assessment and Plan:  Problem List Items Addressed This Visit     Type II diabetes mellitus with complication (HCC) - Primary (Chronic)    Blood sugars stable without hypoglycemic symptoms or events. Currently being treated with Jardiance and metformin. Lab Results  Component Value Date    HGBA1C 6.9 (H) 03/26/2022        Relevant Medications   rosuvastatin (CRESTOR) 10 MG tablet   empagliflozin (JARDIANCE) 25 MG TABS tablet   Other Relevant Orders   Hemoglobin A1c   Hyperlipidemia associated with type 2 diabetes mellitus (HCC) (Chronic)   Relevant Medications   rosuvastatin (CRESTOR) 10 MG tablet   empagliflozin (JARDIANCE) 25 MG TABS tablet   Hypercalcemia    Now off calcium supplements Check PTH and Ca and advise      Relevant Orders   PTH, Intact and Calcium   Essential hypertension (Chronic)    Stable exam with well controlled BP.  Currently taking amlodipine and valsartan with hctz. Tolerating medications without concerns or side effects. Elevated calcium last visit - will recheck today and stop hydrochlorothiazide if still elevated. However, she does have mild LE edema and will need at least a PRN diuretic. Will continue to recommend low sodium diet and current regimen.       Relevant Medications   rosuvastatin (CRESTOR) 10 MG tablet    No follow-ups on file.   Partially dictated using Dragon software, any errors are not intentional.  Reubin Milan, MD [facility] Primary Care and Sports Medicine [place], [place]

## 2022-08-13 NOTE — Assessment & Plan Note (Signed)
Blood sugars stable without hypoglycemic symptoms or events. Currently being treated with Jardiance and metformin. Lab Results  Component Value Date   HGBA1C 6.9 (H) 03/26/2022

## 2022-08-14 LAB — PTH, INTACT AND CALCIUM
Calcium: 10.4 mg/dL — ABNORMAL HIGH (ref 8.7–10.3)
PTH: 59 pg/mL (ref 15–65)

## 2022-08-14 LAB — HEMOGLOBIN A1C
Est. average glucose Bld gHb Est-mCnc: 148 mg/dL
Hgb A1c MFr Bld: 6.8 % — ABNORMAL HIGH (ref 4.8–5.6)

## 2022-08-16 ENCOUNTER — Other Ambulatory Visit: Payer: Self-pay

## 2022-08-16 DIAGNOSIS — R7989 Other specified abnormal findings of blood chemistry: Secondary | ICD-10-CM

## 2022-08-25 ENCOUNTER — Other Ambulatory Visit: Payer: Self-pay | Admitting: Internal Medicine

## 2022-08-25 DIAGNOSIS — E118 Type 2 diabetes mellitus with unspecified complications: Secondary | ICD-10-CM

## 2022-10-15 ENCOUNTER — Telehealth: Payer: Self-pay

## 2022-10-15 NOTE — Telephone Encounter (Signed)
Spoke with patient over the phone and she said she has not heard anything from Endocrinology about schedule appointment. Referral was sent at the end of July. Can you check on this?  Thank you!  - Inmer Nix

## 2022-11-20 ENCOUNTER — Encounter: Payer: Self-pay | Admitting: Internal Medicine

## 2022-11-25 ENCOUNTER — Ambulatory Visit: Payer: 59 | Admitting: Family Medicine

## 2022-11-25 ENCOUNTER — Encounter: Payer: Self-pay | Admitting: Family Medicine

## 2022-11-25 ENCOUNTER — Ambulatory Visit
Admission: RE | Admit: 2022-11-25 | Discharge: 2022-11-25 | Disposition: A | Discharge status: Home / Self Care | Payer: 59 | Attending: Family Medicine | Admitting: Family Medicine

## 2022-11-25 ENCOUNTER — Ambulatory Visit
Admission: RE | Admit: 2022-11-25 | Discharge: 2022-11-25 | Disposition: A | Discharge status: Home / Self Care | Payer: 59 | Source: Ambulatory Visit | Attending: Family Medicine | Admitting: Family Medicine

## 2022-11-25 VITALS — BP 106/74 | HR 91 | Ht 65.0 in | Wt 276.0 lb

## 2022-11-25 DIAGNOSIS — M25561 Pain in right knee: Secondary | ICD-10-CM | POA: Diagnosis not present

## 2022-11-25 DIAGNOSIS — M1711 Unilateral primary osteoarthritis, right knee: Secondary | ICD-10-CM | POA: Diagnosis not present

## 2022-11-25 DIAGNOSIS — Z23 Encounter for immunization: Secondary | ICD-10-CM | POA: Diagnosis not present

## 2022-11-25 MED ORDER — MELOXICAM 15 MG PO TABS
15.0000 mg | ORAL_TABLET | Freq: Every day | ORAL | 0 refills | Status: DC
Start: 2022-11-25 — End: 2022-12-09

## 2022-11-27 ENCOUNTER — Encounter: Payer: Self-pay | Admitting: Internal Medicine

## 2022-11-27 ENCOUNTER — Encounter: Payer: Self-pay | Admitting: Family Medicine

## 2022-11-27 DIAGNOSIS — M1711 Unilateral primary osteoarthritis, right knee: Secondary | ICD-10-CM | POA: Insufficient documentation

## 2022-11-27 DIAGNOSIS — Z23 Encounter for immunization: Secondary | ICD-10-CM | POA: Insufficient documentation

## 2022-11-27 NOTE — Assessment & Plan Note (Signed)
Acute on chronic right knee pain, had treated in the past with OTC medications, most recent flare x 3 weeks after "stepping wrong". Denies mechanical symptoms, some instability. Exam shows diffuse tenderness about the joint lines, no crepitus, provocative testign benign, no laxity.  She has osteoarthritis related arthralgia, medial tibiofemoral joint most involved.  Plan: - Start meloxicam once daily with food x 1-2 weeks - If still symptomatic contact us for next steps - Can consider "hinged knee brace" from online vendor for added support - Can escalate to [place] given comorbid DM2

## 2022-11-27 NOTE — Progress Notes (Signed)
     Primary Care / Sports Medicine Office Visit  Patient Information:  Patient ID: Anita Krueger, female DOB: [birth year] Age: 62 y.o. MRN: [id number]   Anita Krueger is a pleasant 62 y.o. female presenting with the following:  Chief Complaint  Patient presents with   Knee Pain    X3 weeks, right knee, stepped wrong, aching, tylenol did not help    Vitals:   11/25/22 1032  BP: 106/74  Pulse: 91  SpO2: 97%   Vitals:   11/25/22 1032  Weight: 276 lb (125.2 kg)  Height: 5\' 5"  (1.651 m)   Body mass index is 45.93 kg/m.  No results found.   Independent interpretation of notes and tests performed by another provider:   Independent interpretation of recent right knee x-rays remonstrates tricompartmental OA maximally at the th medial tibiofemoral articulation.   Procedures performed:   None  Pertinent History, Exam, Impression, and Recommendations:   Problem List Items Addressed This Visit       Musculoskeletal and Integument   Primary osteoarthritis of right knee - Primary    Acute on chronic right knee pain, had treated in the past with OTC medications, most recent flare x 3 weeks after "stepping wrong". Denies mechanical symptoms, some instability. Exam shows diffuse tenderness about the joint lines, no crepitus, provocative testign benign, no laxity.  She has osteoarthritis related arthralgia, medial tibiofemoral joint most involved.  Plan: - Start meloxicam once daily with food x 1-2 weeks - If still symptomatic contact us for next steps - Can consider "hinged knee brace" from online vendor for added support - Can escalate to [place] given comorbid DM2      Relevant Medications   meloxicam (MOBIC) 15 MG tablet     Other   Need for influenza vaccination   Relevant Orders   Flu vaccine trivalent PF, 6mos and older(Flulaval,Afluria,Fluarix,Fluzone) (Completed)     Orders & Medications Medications:  Meds ordered this encounter  Medications   meloxicam  (MOBIC) 15 MG tablet    Sig: Take 1 tablet (15 mg total) by mouth daily.    Dispense:  30 tablet    Refill:  0   Orders Placed This Encounter  Procedures   DG Knee Complete 4 Views Right   Flu vaccine trivalent PF, 6mos and older(Flulaval,Afluria,Fluarix,Fluzone)     No follow-ups on file.     Jerrol Banana, MD, [facility]   Primary Care Sports Medicine Primary Care and Sports Medicine at [facility]

## 2022-11-27 NOTE — Patient Instructions (Signed)
-   Start meloxicam once daily with food x 1-2 weeks - If still symptomatic contact us for next steps - Can consider "hinged knee brace" from online vendor for added support

## 2022-12-09 ENCOUNTER — Telehealth: Payer: Self-pay

## 2022-12-09 ENCOUNTER — Encounter: Payer: Self-pay | Admitting: Family Medicine

## 2022-12-09 ENCOUNTER — Other Ambulatory Visit: Payer: Self-pay | Admitting: Family Medicine

## 2022-12-09 MED ORDER — MELOXICAM 15 MG PO TABS: 15.0000 mg | ORAL_TABLET | Freq: Every day | ORAL | 0 refills | Status: DC

## 2022-12-09 NOTE — Telephone Encounter (Signed)
Completed PA on [email or web address] for [place] suspension.  (Key: [id number]) PA Case ID #: [id number]  Awaiting outcome.

## 2022-12-09 NOTE — Telephone Encounter (Signed)
PA DENIED - Anita Krueger is not a covered benefit and excluded from coverage in accordance with the terms and conditions of patient's plan benefit.

## 2022-12-10 NOTE — Telephone Encounter (Signed)
Patient informed. 

## 2023-01-03 ENCOUNTER — Encounter: Payer: Self-pay | Admitting: Endocrinology

## 2023-01-03 ENCOUNTER — Ambulatory Visit (INDEPENDENT_AMBULATORY_CARE_PROVIDER_SITE_OTHER): Payer: 59 | Admitting: Endocrinology

## 2023-01-03 DIAGNOSIS — E559 Vitamin D deficiency, unspecified: Secondary | ICD-10-CM | POA: Diagnosis not present

## 2023-01-03 NOTE — Progress Notes (Signed)
Outpatient Endocrinology Note [name], MD  01/03/23  Patient's Name: Anita Krueger    DOB: 05-18-1960    MRN: [id number]  REASON OF VISIT: New consult for hypercalcemia  REFERRING PROVIDER: Reubin Milan, MD  PCP: Reubin Milan, MD  HISTORY OF PRESENT ILLNESS:   Anita Krueger is a 62 y.o. old female with past medical history listed below, is here for new consult of hypercalcemia.    Pertinent Calcium History: - Patient is noted to have mildly elevated serum calcium in the range of 10.3-11.0, first time noted in 2020.  Prior to that from 2016-2020 she used to have serum calcium in the range of 9s.  She has serum albumin of 4.4-5.0 range.  Corrected serum calcium with serum albumin will be normal serum calcium all the time.  For example March 26, 2022 she had serum calcium of 11 and albumin of 5.0 which makes serum corrected  calcium of 10.2, is upper normal with upper normal limit of 10.2.  PTH was 59 in July 2022 with upper normal limit of 65.  - She has been on hydrochlorothiazide for hypertension since 2016. -No history of kidney stone.  No fragility fracture.  She has no history of osteoporosis or osteopenia.  Denies family history of fragility fracture, hypercalcemia or hyperparathyroidism. -Shew used to take MV containing calcium and TUMS, and stopped in July 2024. No separate Vit D intake. Not much diary products, occasionally oat milk and cheese. Diary upset stomach.   Lab:  Latest Reference Range & Units 03/13/21 09:54 08/07/21 11:48 11/27/21 10:28 03/26/22 10:22 08/13/22 10:00  Calcium 8.7 - 10.3 mg/dL 62.1 (H) 30.8 (H) 65.7 11.0 (H) 10.4 (H)  Albumin 3.8 - 4.9 g/dL 4.8  4.9 5.0 (H)   PTH, Intact 15 - 65 pg/mL     59  (H): Data is abnormally high  -Patient denies constipation.  No change in bowel habit.  She she has acid reflux/GERD and taking antiacid.  She feels like she has increased urinary frequency as well.  # Type 2 diabetes mellitus : On Jardiance  and metformin, controlled, managed by primary care provider.  REVIEW OF SYSTEMS:  As per history of present illness.   PAST MEDICAL HISTORY: Past Medical History:  Diagnosis Date   Arthritis    Bell's palsy 2016   right eye droops   Diabetes mellitus without complication (HCC)    Hyperlipidemia    Hypertension    Mood disorder (HCC) 06/29/2019    PAST SURGICAL HISTORY: Past Surgical History:  Procedure Laterality Date   bell's palsy  2016   right eye sluggish   COLONOSCOPY WITH PROPOFOL N/A 03/31/2018   Procedure: COLONOSCOPY WITH PROPOFOL;  Surgeon: Wyline Mood, MD;  Location: [facility] ENDOSCOPY;  Service: Gastroenterology;  Laterality: N/A;   DILATATION & CURETTAGE/HYSTEROSCOPY WITH MYOSURE N/A 03/31/2017   Procedure: DILATATION & CURETTAGE/HYSTEROSCOPY WITH MYOSURE;  Surgeon: Conard Novak, MD;  Location: [facility] ORS;  Service: Gynecology;  Laterality: N/A;   LAPAROSCOPY      ALLERGIES: No Known Allergies  FAMILY HISTORY:  Family History  Problem Relation Age of Onset   Hypertension Mother    Diabetes Mother    Hypertension Father    Breast cancer Cousin        PAT COUSIN   No known family history of hypercalcemia, kidney stone, hyperparathyroidism, or endocrine neoplasms.   SOCIAL HISTORY: Social History   Socioeconomic History   Marital status: Single    Spouse name: Not  on file   Number of children: Not on file   Years of education: Not on file   Highest education level: Not on file  Occupational History   Not on file  Tobacco Use   Smoking status: Former    Types: Cigarettes   Smokeless tobacco: Never   Tobacco comments:    40 years ago  Vaping Use   Vaping status: Not on file  Substance and Sexual Activity   Alcohol use: Yes    Alcohol/week: 2.0 standard drinks of alcohol    Types: 2 Shots of liquor per week    Comment: occasional   Drug use: No   Sexual activity: Not Currently    Birth control/protection: None  Other Topics Concern   Not on  file  Social History Narrative   Not on file   Social Determinants of Health   Financial Resource Strain: Low Risk  (11/27/2021)   Overall Financial Resource Strain (CARDIA)    Difficulty of Paying Living Expenses: Not hard at all  Food Insecurity: No Food Insecurity (11/27/2021)   Hunger Vital Sign    Worried About Running Out of Food in the Last Year: Never true    Ran Out of Food in the Last Year: Never true  Transportation Needs: No Transportation Needs (11/27/2021)   PRAPARE - Administrator, Civil Service (Medical): No    Lack of Transportation (Non-Medical): No  Physical Activity: Not on file  Stress: Not on file  Social Connections: Not on file    MEDICATIONS:  Current Outpatient Medications  Medication Sig Dispense Refill   Acetaminophen (TYLENOL PO) Take by mouth as needed.     amLODipine (NORVASC) 5 MG tablet TAKE 1 TABLET BY MOUTH DAILY 90 tablet 3   Blood Glucose Monitoring Suppl (CONTOUR NEXT MONITOR) w/Device KIT USE AS DIRECTED 1 kit 0   Carboxymethylcellul-Glycerin 1-0.25 % SOLN Place 1 drop into both eyes 3 (three) times daily as needed (dry eyes).     carboxymethylcellulose (REFRESH PLUS) 0.5 % SOLN Place 1 drop into both eyes 3 (three) times daily as needed (dry eyes).     clotrimazole-betamethasone (LOTRISONE) cream Apply 1 Application topically daily. 30 g 2   empagliflozin (JARDIANCE) 25 MG TABS tablet Take 1 tablet (25 mg total) by mouth daily before breakfast. 90 tablet 3   Ferrous Sulfate (IRON PO) Take by mouth daily.     glucose blood (ONETOUCH ULTRA) test strip USE 1 STRIP UP TO 4 TIMES  DAILY AS DIRECTED 100 strip 12   Lancets (ONETOUCH DELICA PLUS LANCET33G) MISC 1 each by Does not apply route in the morning and at bedtime. 100 each 3   loratadine (CLARITIN) 10 MG tablet Take 10 mg by mouth daily as needed for allergies.     meloxicam (MOBIC) 15 MG tablet Take 1 tablet (15 mg total) by mouth daily. 30 tablet 0   metFORMIN (GLUCOPHAGE-XR)  500 MG 24 hr tablet TAKE 1 TABLET BY MOUTH TWICE  DAILY BEFORE A MEAL 180 tablet 3   olmesartan-hydrochlorothiazide (BENICAR HCT) 40-25 MG tablet TAKE 1 TABLET BY MOUTH DAILY 90 tablet 3   Probiotic Product (PROBIOTIC PO) Take by mouth daily.     rosuvastatin (CRESTOR) 10 MG tablet Take 1 tablet (10 mg total) by mouth daily. 90 tablet 3   UNABLE TO FIND as needed. Med Name: biofreeze     No current facility-administered medications for this visit.    PHYSICAL EXAM: Vitals:   01/03/23 1315  BP: 102/68  Pulse: 98  Resp: 20  SpO2: 95%  Weight: 278 lb 9.6 oz (126.4 kg)  Height: 5\' 5"  (1.651 m)   Body mass index is 46.36 kg/m.   General: Well developed, well nourished female in no apparent distress.  HEENT: AT/[place], no external lesions. Hearing intact to the spoken word Eyes: EOMI. Conjunctiva clear and no icterus. Neck: Trachea midline, neck supple without appreciable thyromegaly or lymphadenopathy and no palpable thyroid nodules Lungs: Clear to auscultation, no wheeze. Respirations not labored Heart: S1S2, Regular in rate and rhythm.  Abdomen: Soft, non tender, non distended Neurologic: Alert, oriented, normal speech, deep tendon biceps reflexes normal,  no gross focal neurological deficit Extremities: No pedal pitting edema, no tremors of outstretched hands Skin: Warm, color good.  Psychiatric: Does not appear depressed or anxious  PERTINENT HISTORIC LABORATORY AND IMAGING STUDIES:  All pertinent laboratory results were reviewed. Please see HPI also for further details.   ASSESSMENT / PLAN  1. Hypercalcemia    -Patient has elevated serum calcium in the range of 10.3-11.0, as noted from 2020, prior to that she used to have serum calcium in 9 range.  It seems like serum calcium is slowly increasing.  However, she has high albumin in the range of 4.4-5.0.  Corrected serum calcium with albumin with highest being 10.2.  Considering the level of albumin corrected calcium has always been  normal, and it has been upper normal range. -Patient has been taking hydrochlorothiazide since 2016.  Hydrochlorothiazide is known to cause hypercalcemia. -She has normal parathyroid hormone/PTH of 59 with upper normal range of 65. -No fragility fracture and kidney stone. -In this context she has slowly increasing serum calcium however within the normal range and has been corrected calcium in the upper normal range, it could be related with hydrochlorothiazide.  The way to know is if we stop hydrochlorothiazide serum calcium should get better in few months.  Plan: -I would like to check renal function panel, PTH and vitamin D level today. -As long as serum calcium corrected remained in the normal range, no plan for further test and treatment. -Will continue to monitor calcium level at least annually. -Consider holding hydrochlorothiazide and adjusting antihypertensive medication and recheck the serum calcium in few months. I routed this office note to primary care provider.   Diagnoses and all orders for this visit:  Hypercalcemia -     Renal function panel -     VITAMIN D 25 Hydroxy (Vit-D Deficiency, Fractures) -     Parathyroid hormone, intact (no Ca) -     Magnesium    DISPOSITION Follow up in clinic in 12 months suggested.  All questions answered and patient verbalized understanding of the plan.  [place] Reynolds Kittel, MD [facility] End[facility]inology [facility] [address] [place], [address] [place], [place] [address] Phone # [phone]  At least part of this note was generated using voice recognition software. Inadvertent word errors may have occurred, which were not recognized during the proofreading process.  Addendum:  Labs reviewed serum calcium 10.46, albumin 4.6 and corrected serum calcium of 9.7.  It was normal 2.7.  Vitamin D is low 14.  PTH elevated at 83.  Elevated PTH can also be related with low vitamin D.  We need to recheck PTH after normalizing vitamin D level.   Will continue to monitor.  Recommend to take vitamin D3 3000 international unit daily over-the-counter.  Recheck vitamin D level in 4 months.   Latest Reference Range &  Units 01/03/23 13:48  Sodium 135 - 146 mmol/L 141  Potassium 3.5 - 5.3 mmol/L 4.3  Chloride 98 - 110 mmol/L 99  CO2 20 - 32 mmol/L 29  Glucose 65 - 99 mg/dL 657 (H)  BUN 7 - 25 mg/dL 31 (H)  Creatinine 8.46 - 1.05 mg/dL 9.62 (H)  Calcium 8.6 - 10.4 mg/dL 95.2  BUN/Creatinine Ratio 6 - 22 (calc) 27 (H)  Phosphorus 2.5 - 4.5 mg/dL 2.7  Magnesium 1.5 - 2.5 mg/dL 2.1  (H): Data is abnormally high   Latest Reference Range & Units 01/03/23 13:48  Vitamin D, 25-Hydroxy 30 - 100 ng/mL 14 (L)  (L): Data is abnormally low   Latest Reference Range & Units 01/03/23 13:48  Glucose 65 - 99 mg/dL 841 (H)  PTH, Intact 16 - 77 pg/mL 83 (H)  Albumin MSPROF 3.6 - 5.1 g/dL 4.6  (H): Data is abnormally high

## 2023-01-04 LAB — RENAL FUNCTION PANEL
Albumin: 4.6 g/dL (ref 3.6–5.1)
BUN/Creatinine Ratio: 27 (calc) — ABNORMAL HIGH (ref 6–22)
BUN: 31 mg/dL — ABNORMAL HIGH (ref 7–25)
CO2: 29 mmol/L (ref 20–32)
Calcium: 10.2 mg/dL (ref 8.6–10.4)
Chloride: 99 mmol/L (ref 98–110)
Creat: 1.13 mg/dL — ABNORMAL HIGH (ref 0.50–1.05)
Glucose, Bld: 101 mg/dL — ABNORMAL HIGH (ref 65–99)
Phosphorus: 2.7 mg/dL (ref 2.5–4.5)
Potassium: 4.3 mmol/L (ref 3.5–5.3)
Sodium: 141 mmol/L (ref 135–146)

## 2023-01-04 LAB — PARATHYROID HORMONE, INTACT (NO CA): PTH: 83 pg/mL — ABNORMAL HIGH (ref 16–77)

## 2023-01-04 LAB — VITAMIN D 25 HYDROXY (VIT D DEFICIENCY, FRACTURES): Vit D, 25-Hydroxy: 14 ng/mL — ABNORMAL LOW (ref 30–100)

## 2023-01-04 LAB — MAGNESIUM: Magnesium: 2.1 mg/dL (ref 1.5–2.5)

## 2023-01-05 ENCOUNTER — Telehealth: Payer: Self-pay

## 2023-01-05 NOTE — Telephone Encounter (Signed)
-----   Message from [place] Thapa sent at 01/05/2023  2:31 PM EST ----- Please notify patient of Labs reviewed serum calcium 10.46, albumin 4.6 and corrected serum calcium of 9.7.  It was normal 2.7.  Vitamin D is low 14.  PTH elevated at 83.  Elevated PTH can also be related with low vitamin D.  We need to recheck PTH after normalizing vitamin D level.  Will continue to monitor.  Recommend to take vitamin D3 3000 international unit daily over-the-counter.  Recheck vitamin D level in 4 months.  I have placed order, please arrange for lab or she can complete lab with her primary care provider visit as well.

## 2023-01-05 NOTE — Addendum Note (Signed)
Addended by: Altus Zaino, [place] on: 01/05/2023 02:30 PM   Modules accepted: Orders

## 2023-01-05 NOTE — Telephone Encounter (Signed)
Patient given results and medication adjustments as directed by MD. No further questions at this time. Patient preferred to call office back to schedule labs.

## 2023-01-14 ENCOUNTER — Other Ambulatory Visit: Payer: Self-pay

## 2023-01-14 MED ORDER — MELOXICAM 15 MG PO TABS: 15.0000 mg | ORAL_TABLET | Freq: Every day | ORAL | 0 refills | Status: DC

## 2023-01-20 ENCOUNTER — Encounter: Payer: Self-pay | Admitting: Family Medicine

## 2023-01-20 NOTE — Telephone Encounter (Signed)
Please advise. Thank you

## 2023-01-24 NOTE — Telephone Encounter (Signed)
Please review.  KP

## 2023-02-14 NOTE — Telephone Encounter (Signed)
 Please review.  KP

## 2023-02-17 NOTE — Telephone Encounter (Signed)
 Please schedule patient appt. Thank you.    JM

## 2023-02-18 ENCOUNTER — Other Ambulatory Visit: Payer: Self-pay | Admitting: Family Medicine

## 2023-02-21 NOTE — Telephone Encounter (Signed)
 Please review.  KP

## 2023-02-21 NOTE — Telephone Encounter (Signed)
 Requested medication (s) are due for refill today: yes  Requested medication (s) are on the active medication list: yes  Last refill:  01/14/23 #30  Future visit scheduled: yes  Notes to clinic:   please see notation from labs sone in November. Pt was to come back for recheck of creatine    Requested Prescriptions  Pending Prescriptions Disp Refills   meloxicam  (MOBIC ) 15 MG tablet [Pharmacy Med Name: MELOXICAM  15MG  TABLETS] 30 tablet 0    Sig: TAKE 1 TABLET(15 MG) BY MOUTH DAILY     Analgesics:  COX2 Inhibitors Failed - 02/21/2023  1:50 PM      Failed - Manual Review: Labs are only required if the patient has taken medication for more than 8 weeks.      Failed - Cr in normal range and within 360 days    Creat  Date Value Ref Range Status  01/03/2023 1.13 (H) 0.50 - 1.05 mg/dL Final         Failed - ALT in normal range and within 360 days    ALT  Date Value Ref Range Status  03/26/2022 39 (H) 0 - 32 IU/L Final         Passed - HGB in normal range and within 360 days    Hemoglobin  Date Value Ref Range Status  03/26/2022 15.1 11.1 - 15.9 g/dL Final         Passed - HCT in normal range and within 360 days    Hematocrit  Date Value Ref Range Status  03/26/2022 44.7 34.0 - 46.6 % Final         Passed - AST in normal range and within 360 days    AST  Date Value Ref Range Status  03/26/2022 32 0 - 40 IU/L Final         Passed - eGFR is 30 or above and within 360 days    GFR calc Af Amer  Date Value Ref Range Status  01/28/2020 86 >59 mL/min/1.73 Final    Comment:    **In accordance with recommendations from the NKF-ASN Task force,**   [facility] is in the process of updating its eGFR calculation to the   2021 CKD-EPI creatinine equation that estimates kidney function   without a race variable.    GFR calc non Af Amer  Date Value Ref Range Status  01/28/2020 75 >59 mL/min/1.73 Final   eGFR  Date Value Ref Range Status  03/26/2022 78 >59 mL/min/1.73 Final          Passed - Patient is not pregnant      Passed - Valid encounter within last 12 months    Recent Outpatient Visits           2 months ago Right knee pain, unspecified chronicity   [place] [facility], Selinda PARAS, MD   6 months ago Type II diabetes mellitus with complication [facility])   [place] [facility] at [facility], Leita DEL, MD   11 months ago Annual physical exam   [facility] at [facility], Leita DEL, MD   1 year ago Essential hypertension   [place] [facility] at [facility], Leita DEL, MD   1 year ago Type II diabetes mellitus with complication [facility])    Primary Care & Sports Medicine at [facility], Leita DEL, MD  Future Appointments             In 1 month Justus, Leita DEL, MD [facility] at [facility], [facility]

## 2023-02-22 ENCOUNTER — Encounter: Payer: Self-pay | Admitting: Family Medicine

## 2023-02-22 ENCOUNTER — Other Ambulatory Visit (INDEPENDENT_AMBULATORY_CARE_PROVIDER_SITE_OTHER): Payer: 59 | Admitting: Radiology

## 2023-02-22 ENCOUNTER — Ambulatory Visit: Payer: 59 | Admitting: Family Medicine

## 2023-02-22 VITALS — BP 110/78 | HR 89 | Ht 65.0 in | Wt 280.0 lb

## 2023-02-22 DIAGNOSIS — M1711 Unilateral primary osteoarthritis, right knee: Secondary | ICD-10-CM | POA: Diagnosis not present

## 2023-02-22 DIAGNOSIS — G8929 Other chronic pain: Secondary | ICD-10-CM | POA: Diagnosis not present

## 2023-02-22 DIAGNOSIS — M25561 Pain in right knee: Secondary | ICD-10-CM

## 2023-02-22 MED ORDER — TRIAMCINOLONE ACETONIDE 40 MG/ML IJ SUSP
40.0000 mg | Freq: Once | INTRAMUSCULAR | Status: AC
  Administered 2023-02-22: 40 mg via INTRAMUSCULAR

## 2023-02-22 NOTE — Patient Instructions (Addendum)
 You have just been given a cortisone injection to reduce pain and inflammation. After the injection you may notice immediate relief of pain as a result of the Lidocaine . It is important to rest the area of the injection for 24 to 48 hours after the injection. There is a possibility of some temporary increased discomfort and swelling for up to 72 hours until the cortisone begins to work. If you do have pain, simply rest the joint and use ice. If you can tolerate over the counter medications, you can try Tylenol  for added relief per package instructions.  - Use Tylenol  as needed - Start home exercises and perform on a regular basis for the next 4 to 6 weeks Exercise website: https://orthoinfo.aaos.org/globalassets/pdfs/2017-rehab_knee.pdf - Contact for questions and follow-up as needed particular for any persistent symptoms at the 2-4-week mark or beyond

## 2023-02-22 NOTE — Progress Notes (Signed)
     Primary Care / Sports Medicine Office Visit  Patient Information:  Patient ID: Nakayla M Ke, female DOB: 1961/02/08 Age: 63 y.o. MRN: [id number]   Anita Krueger is a pleasant 63 y.o. female presenting with the following:  Chief Complaint  Patient presents with   Knee Pain    Chronic right knee pain. Would like to discuss getting an injection.     Vitals:   02/22/23 1537  BP: 110/78  Pulse: 89  SpO2: 96%   Vitals:   02/22/23 1537  Weight: 280 lb (127 kg)  Height: 5' 5 (1.651 m)   Body mass index is 46.59 kg/m.  No results found.   Independent interpretation of notes and tests performed by another provider:   None  Procedures performed:   Procedure:  Injection of right knee under ultrasound guidance. Ultrasound guidance utilized for out of plane anteromedial approach, joint space visualized, no effusion Samsung HS60 device utilized with permanent recording / reporting. Verbal informed consent obtained and verified. Skin prepped in a sterile fashion. Ethyl chloride for topical local analgesia.  Completed without difficulty and tolerated well. Medication: triamcinolone  acetonide 40 mg/mL suspension for injection 1 mL total and 2 mL lidocaine  1% without epinephrine utilized for needle placement anesthetic Advised to contact for fevers/chills, erythema, induration, drainage, or persistent bleeding.   Pertinent History, Exam, Impression, and Recommendations:   Problem List Items Addressed This Visit     Primary osteoarthritis of right knee - Primary   History of Present Illness The patient, with a known diagnosis of right knee arthritis, reports persistent pain despite a reduction in swelling. They have been on oral anti-inflammatory medication and topical Voltaren for pain management. The patient notes that while the pain is not as severe as before, it still persists and limits their ability to walk or exercise. They have been supplementing the prescribed  treatment with over-the-counter Tylenol . The patient has also tried using a stationary bike for exercise but found it painful. They have not had a cortisone shot before.  Results LABS  Latest Reference Range & Units 01/03/23 13:48  BUN 7 - 25 mg/dL 31 (H)  Creatinine 9.49 - 1.05 mg/dL 8.86 (H)  (H): Data is abnormally high  Assessment and Plan Primary osteoarthritis right knee  Persistent pain despite reduction in swelling with oral and topical anti-inflammatories. No red flag symptoms. X-rays confirm arthritis.  Renal function contraindication to continued oral NSAIDs, A1c stable, 6.8 -Patient elected to proceed with right knee intra-articular administer cortisone injection today. -Advise relative rest for two days post-injection. -Recommend icing knee twice today post-injection. -Continue with Tylenol  as needed. -Check response to cortisone injection in 2-4 weeks. If no improvement, consider further imaging. -Encourage patient to start home exercises for knee strengthening, aiming for long-term commitment to exercise regimen.      Relevant Orders   US  LIMITED JOINT SPACE STRUCTURES LOW RIGHT     Orders & Medications Medications:  Meds ordered this encounter  Medications   triamcinolone  acetonide (KENALOG -40) injection 40 mg   Orders Placed This Encounter  Procedures   US  LIMITED JOINT SPACE STRUCTURES LOW RIGHT     Return if symptoms worsen or fail to improve.     Selinda JINNY Ku, MD, [facility]   Primary Care Sports Medicine [facility] at [facility]

## 2023-02-22 NOTE — Assessment & Plan Note (Signed)
 History of Present Illness The patient, with a known diagnosis of right knee arthritis, reports persistent pain despite a reduction in swelling. They have been on oral anti-inflammatory medication and topical Voltaren for pain management. The patient notes that while the pain is not as severe as before, it still persists and limits their ability to walk or exercise. They have been supplementing the prescribed treatment with over-the-counter Tylenol . The patient has also tried using a stationary bike for exercise but found it painful. They have not had a cortisone shot before.  Results LABS  Latest Reference Range & Units 01/03/23 13:48  BUN 7 - 25 mg/dL 31 (H)  Creatinine 9.49 - 1.05 mg/dL 8.86 (H)  (H): Data is abnormally high  Assessment and Plan Primary osteoarthritis right knee  Persistent pain despite reduction in swelling with oral and topical anti-inflammatories. No red flag symptoms. X-rays confirm arthritis.  Renal function contraindication to continued oral NSAIDs, A1c stable, 6.8 -Patient elected to proceed with right knee intra-articular administer cortisone injection today. -Advise relative rest for two days post-injection. -Recommend icing knee twice today post-injection. -Continue with Tylenol  as needed. -Check response to cortisone injection in 2-4 weeks. If no improvement, consider further imaging. -Encourage patient to start home exercises for knee strengthening, aiming for long-term commitment to exercise regimen.

## 2023-04-01 ENCOUNTER — Other Ambulatory Visit: Payer: Self-pay | Admitting: Internal Medicine

## 2023-04-01 ENCOUNTER — Encounter: Payer: BC Managed Care – PPO | Admitting: Internal Medicine

## 2023-04-01 DIAGNOSIS — Z1231 Encounter for screening mammogram for malignant neoplasm of breast: Secondary | ICD-10-CM

## 2023-04-04 ENCOUNTER — Encounter: Payer: Self-pay | Admitting: Internal Medicine

## 2023-04-19 ENCOUNTER — Encounter: Payer: Self-pay | Admitting: Internal Medicine

## 2023-04-19 ENCOUNTER — Ambulatory Visit (INDEPENDENT_AMBULATORY_CARE_PROVIDER_SITE_OTHER): Payer: 59 | Admitting: Internal Medicine

## 2023-04-19 VITALS — BP 110/72 | HR 104 | Ht 65.0 in | Wt 272.1 lb

## 2023-04-19 DIAGNOSIS — E785 Hyperlipidemia, unspecified: Secondary | ICD-10-CM

## 2023-04-19 DIAGNOSIS — N950 Postmenopausal bleeding: Secondary | ICD-10-CM | POA: Diagnosis not present

## 2023-04-19 DIAGNOSIS — Z23 Encounter for immunization: Secondary | ICD-10-CM

## 2023-04-19 DIAGNOSIS — E118 Type 2 diabetes mellitus with unspecified complications: Secondary | ICD-10-CM | POA: Diagnosis not present

## 2023-04-19 DIAGNOSIS — I10 Essential (primary) hypertension: Secondary | ICD-10-CM | POA: Diagnosis not present

## 2023-04-19 DIAGNOSIS — Z0000 Encounter for general adult medical examination without abnormal findings: Secondary | ICD-10-CM | POA: Diagnosis not present

## 2023-04-19 DIAGNOSIS — E1169 Type 2 diabetes mellitus with other specified complication: Secondary | ICD-10-CM

## 2023-04-19 DIAGNOSIS — Z1231 Encounter for screening mammogram for malignant neoplasm of breast: Secondary | ICD-10-CM

## 2023-04-19 DIAGNOSIS — Z7984 Long term (current) use of oral hypoglycemic drugs: Secondary | ICD-10-CM | POA: Diagnosis not present

## 2023-04-19 MED ORDER — ONETOUCH ULTRA VI STRP: ORAL_STRIP | 12 refills | Status: DC

## 2023-04-19 MED ORDER — AMLODIPINE BESYLATE 5 MG PO TABS: 5.0000 mg | ORAL_TABLET | Freq: Every day | ORAL | 3 refills | Status: AC

## 2023-04-19 MED ORDER — TIRZEPATIDE 2.5 MG/0.5ML ~~LOC~~ SOAJ: 2.5000 mg | SUBCUTANEOUS | 0 refills | Status: DC

## 2023-04-19 MED ORDER — OLMESARTAN MEDOXOMIL-HCTZ 40-25 MG PO TABS: 1.0000 | ORAL_TABLET | Freq: Every day | ORAL | 3 refills | Status: AC

## 2023-04-19 NOTE — Assessment & Plan Note (Signed)
 Blood sugars stable without hypoglycemic symptoms or events. Currently managed with Jardiance and MTF. Changes made last visit are none. Lab Results  Component Value Date   HGBA1C 6.8 (H) 08/13/2022  Will add Mounjaro. Check labs. Request eye exam

## 2023-04-19 NOTE — Assessment & Plan Note (Signed)
 Controlled BP with normal exam. Current regimen is Benicar hct and amlodipine. Will continue same medications; encourage continued reduced sodium diet.

## 2023-04-19 NOTE — Progress Notes (Signed)
 Date:  04/19/2023   Name:  Anita Krueger   DOB:  1960-03-15   MRN:  [id number]   Chief Complaint: Annual Exam Anita Krueger is a 63 y.o. female who presents today for her Complete Annual Exam. She feels well. She reports exercising stretches and strentghing excersices, 2 -3 times a week. She reports she is sleeping fairly well. Breast complaints none.  Health Maintenance  Topic Date Due   Hemoglobin A1C  02/13/2023   Yearly kidney health urinalysis for diabetes  03/27/2023   COVID-19 Vaccine (4 - 2024-25 season) 05/05/2023*   Zoster (Shingles) Vaccine (1 of 2) 07/20/2023*   DTaP/Tdap/Td vaccine (1 - Tdap) 04/18/2024*   Mammogram  05/07/2023   Eye exam for diabetics  06/30/2023   Yearly kidney function blood test for diabetes  01/03/2024   Complete foot exam   04/18/2024   Pap with HPV screening  03/27/2027   Colon Cancer Screening  03/31/2028   Pneumococcal Vaccination  Completed   Flu Shot  Completed   Hepatitis C Screening  Completed   HIV Screening  Addressed   HPV Vaccine  Aged Out  *Topic was postponed. The date shown is not the original due date.    Diabetes She presents for her follow-up diabetic visit. She has type 2 diabetes mellitus. Pertinent negatives for hypoglycemia include no headaches or tremors. Pertinent negatives for diabetes include no chest pain, no fatigue, no polydipsia and no polyuria. Current diabetic treatments: MTF and Jardiance. An ACE inhibitor/angiotensin II receptor blocker is being taken. Eye exam is not current.  Hyperlipidemia This is a chronic problem. Pertinent negatives include no chest pain or shortness of breath. Current antihyperlipidemic treatment includes statins.  Hypertension This is a chronic problem. The problem is controlled. Pertinent negatives include no chest pain, headaches, palpitations or shortness of breath. Past treatments include angiotensin blockers, calcium channel blockers and diuretics. The current treatment provides  significant improvement.  Vaginal bleeding.- she had evaluation in 2021 and did well until a few months ago had recurrence.  No pain, no injury.  Review of Systems  Constitutional:  Negative for appetite change, fatigue, fever and unexpected weight change.  HENT:  Negative for tinnitus and trouble swallowing.   Eyes:  Negative for visual disturbance.  Respiratory:  Negative for cough, chest tightness and shortness of breath.   Cardiovascular:  Negative for chest pain, palpitations and leg swelling.  Gastrointestinal:  Negative for abdominal pain.  Endocrine: Negative for polydipsia and polyuria.  Genitourinary:  Positive for vaginal bleeding (in Jan lasted 3 weeks). Negative for dysuria and hematuria.  Musculoskeletal:  Negative for arthralgias.  Neurological:  Negative for tremors, numbness and headaches.  Psychiatric/Behavioral:  Negative for dysphoric mood.      Lab Results  Component Value Date   NA 141 01/03/2023   K 4.3 01/03/2023   CO2 29 01/03/2023   GLUCOSE 101 (H) 01/03/2023   BUN 31 (H) 01/03/2023   CREATININE 1.13 (H) 01/03/2023   CALCIUM 10.2 01/03/2023   EGFR 78 03/26/2022   GFRNONAA 75 01/28/2020   Lab Results  Component Value Date   CHOL 134 03/26/2022   HDL 37 (L) 03/26/2022   LDLCALC 74 03/26/2022   TRIG 128 03/26/2022   CHOLHDL 3.6 03/26/2022   Lab Results  Component Value Date   TSH 1.380 03/26/2022   Lab Results  Component Value Date   HGBA1C 6.8 (H) 08/13/2022   Lab Results  Component Value Date   WBC 10.0 03/26/2022  HGB 15.1 03/26/2022   HCT 44.7 03/26/2022   MCV 86 03/26/2022   PLT 257 03/26/2022   Lab Results  Component Value Date   ALT 39 (H) 03/26/2022   AST 32 03/26/2022   ALKPHOS 85 03/26/2022   BILITOT 0.3 03/26/2022   Lab Results  Component Value Date   VD25OH 14 (L) 01/03/2023     Patient Active Problem List   Diagnosis Date Noted   Primary osteoarthritis of right knee 11/27/2022   Need for influenza vaccination  11/27/2022   Hypercalcemia 08/13/2022   Patellar tendonitis of right knee 11/07/2020   Tubular adenoma of colon 04/03/2018   Iron deficiency anemia 01/24/2018   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   Menorrhagia with irregular cycle 01/19/2017   Hip pain, acute, left 05/12/2016   Essential hypertension 04/04/2015   Type II diabetes mellitus with complication (HCC) 01/27/2015   Hyperlipidemia associated with type 2 diabetes mellitus (HCC) 09/29/2014    No Known Allergies  Past Surgical History:  Procedure Laterality Date   bell's palsy  2016   right eye sluggish   COLONOSCOPY WITH PROPOFOL N/A 03/31/2018   Procedure: COLONOSCOPY WITH PROPOFOL;  Surgeon: Wyline Mood, MD;  Location: [facility] ENDOSCOPY;  Service: Gastroenterology;  Laterality: N/A;   DILATATION & CURETTAGE/HYSTEROSCOPY WITH MYOSURE N/A 03/31/2017   Procedure: DILATATION & CURETTAGE/HYSTEROSCOPY WITH MYOSURE;  Surgeon: Conard Novak, MD;  Location: [facility] ORS;  Service: Gynecology;  Laterality: N/A;   LAPAROSCOPY      Social History   Tobacco Use   Smoking status: Former    Types: Cigarettes   Smokeless tobacco: Never   Tobacco comments:    40 years ago  Substance Use Topics   Alcohol use: Yes    Alcohol/week: 2.0 standard drinks of alcohol    Types: 2 Shots of liquor per week    Comment: occasional   Drug use: No     Medication list has been reviewed and updated.  Current Meds  Medication Sig   Acetaminophen (TYLENOL PO) Take by mouth as needed.   Blood Glucose Monitoring Suppl (CONTOUR NEXT MONITOR) w/Device KIT USE AS DIRECTED   Carboxymethylcellul-Glycerin 1-0.25 % SOLN Place 1 drop into both eyes 3 (three) times daily as needed (dry eyes).   carboxymethylcellulose (REFRESH PLUS) 0.5 % SOLN Place 1 drop into both eyes 3 (three) times daily as needed (dry eyes).   cholecalciferol (VITAMIN D3) 25 MCG (1000 UNIT) tablet Take 1,000 Units by mouth daily.   clotrimazole-betamethasone  (LOTRISONE) cream Apply 1 Application topically daily.   empagliflozin (JARDIANCE) 25 MG TABS tablet Take 1 tablet (25 mg total) by mouth daily before breakfast.   Ferrous Sulfate (IRON PO) Take by mouth daily.   Lancets (ONETOUCH DELICA PLUS LANCET33G) MISC 1 each by Does not apply route in the morning and at bedtime.   loratadine (CLARITIN) 10 MG tablet Take 10 mg by mouth daily as needed for allergies.   metFORMIN (GLUCOPHAGE-XR) 500 MG 24 hr tablet TAKE 1 TABLET BY MOUTH TWICE  DAILY BEFORE A MEAL   rosuvastatin (CRESTOR) 10 MG tablet Take 1 tablet (10 mg total) by mouth daily.   tirzepatide [facility]) 2.5 MG/0.5ML Pen Inject 2.5 mg into the skin once a week.   UNABLE TO FIND as needed. Med Name: biofreeze   [DISCONTINUED] amLODipine (NORVASC) 5 MG tablet TAKE 1 TABLET BY MOUTH DAILY   [DISCONTINUED] glucose blood (ONETOUCH ULTRA) test strip USE 1 STRIP UP TO 4 TIMES  DAILY AS DIRECTED   [  DISCONTINUED] olmesartan-hydrochlorothiazide (BENICAR HCT) 40-25 MG tablet TAKE 1 TABLET BY MOUTH DAILY       04/19/2023    1:26 PM 11/25/2022   11:52 AM 08/13/2022    9:32 AM 03/26/2022    9:40 AM  GAD 7 : Generalized Anxiety Score  Nervous, Anxious, on Edge 2 1 1 1   Control/stop worrying 1 1 1 1   Worry too much - different things 1 1 1 1   Trouble relaxing 1 1 1 1   Restless 1 1 1 1   Easily annoyed or irritable 1 1 1 1   Afraid - awful might happen 1 1 1 1   Total GAD 7 Score 8 7 7 7   Anxiety Difficulty Somewhat difficult Not difficult at all Somewhat difficult Somewhat difficult       04/19/2023    1:26 PM 11/25/2022   11:51 AM 08/13/2022    9:32 AM  Depression screen PHQ 2/9  Decreased Interest 0 1 1  Down, Depressed, Hopeless 0 1 0  PHQ - 2 Score 0 2 1  Altered sleeping 2 2 2   Tired, decreased energy 2 2 2   Change in appetite 2 0 2  Feeling bad or failure about yourself  0 0 1  Trouble concentrating 2 1 1   Moving slowly or fidgety/restless 1 0 2  Suicidal thoughts 0 0 0  PHQ-9 Score 9 7  11   Difficult doing work/chores Not difficult at all Not difficult at all Not difficult at all    BP Readings from Last 3 Encounters:  04/19/23 110/72  02/22/23 110/78  01/03/23 102/68    Physical Exam Vitals and nursing note reviewed.  Constitutional:      General: She is not in acute distress.    Appearance: She is well-developed.  HENT:     Head: Normocephalic and atraumatic.     Right Ear: Tympanic membrane and ear canal normal.     Left Ear: Tympanic membrane and ear canal normal.     Nose:     Right Sinus: No maxillary sinus tenderness.     Left Sinus: No maxillary sinus tenderness.  Eyes:     General: No scleral icterus.       Right eye: No discharge.        Left eye: No discharge.     Conjunctiva/sclera: Conjunctivae normal.  Neck:     Thyroid: No thyromegaly.     Vascular: No carotid bruit.  Cardiovascular:     Rate and Rhythm: Normal rate and regular rhythm.     Pulses: Normal pulses.     Heart sounds: Normal heart sounds.  Pulmonary:     Effort: Pulmonary effort is normal. No respiratory distress.     Breath sounds: No wheezing.  Abdominal:     General: Bowel sounds are normal.     Palpations: Abdomen is soft.     Tenderness: There is no abdominal tenderness.  Genitourinary:    Comments: deferred Musculoskeletal:     Cervical back: Normal range of motion. No erythema.     Right lower leg: No edema.     Left lower leg: No edema.  Lymphadenopathy:     Cervical: No cervical adenopathy.  Skin:    General: Skin is warm and dry.     Capillary Refill: Capillary refill takes less than 2 seconds.     Findings: No rash.  Neurological:     Mental Status: She is alert and oriented to person, place, and time.     Cranial Nerves:  No cranial nerve deficit.     Sensory: No sensory deficit.     Deep Tendon Reflexes: Reflexes are normal and symmetric.  Psychiatric:        Attention and Perception: Attention normal.        Mood and Affect: Mood normal.     Diabetic Foot Exam - Simple   Simple Foot Form Diabetic Foot exam was performed with the following findings: Yes 04/19/2023  1:46 PM  Visual Inspection No deformities, no ulcerations, no other skin breakdown bilaterally: Yes Sensation Testing Intact to touch and monofilament testing bilaterally: Yes Pulse Check Posterior Tibialis and Dorsalis pulse intact bilaterally: Yes Comments Tinea pedis bilaterally      Wt Readings from Last 3 Encounters:  04/19/23 272 lb 2 oz (123.4 kg)  02/22/23 280 lb (127 kg)  01/03/23 278 lb 9.6 oz (126.4 kg)    BP 110/72   Pulse (!) 104   Ht 5\' 5"  (1.651 m)   Wt 272 lb 2 oz (123.4 kg)   LMP 05/22/2018 (Exact Date)   SpO2 91%   BMI 45.28 kg/m   Assessment and Plan:  Problem List Items Addressed This Visit       Unprioritized   Hyperlipidemia associated with type 2 diabetes mellitus (HCC) (Chronic)   Relevant Medications   olmesartan-hydrochlorothiazide (BENICAR HCT) 40-25 MG tablet   amLODipine (NORVASC) 5 MG tablet   tirzepatide (MOUNJARO) 2.5 MG/0.5ML Pen   Other Relevant Orders   Comprehensive metabolic panel   Lipid panel   Type II diabetes mellitus with complication (HCC) (Chronic)   Blood sugars stable without hypoglycemic symptoms or events. Currently managed with Jardiance and MTF. Changes made last visit are none. Lab Results  Component Value Date   HGBA1C 6.8 (H) 08/13/2022  Will add Mounjaro. Check labs. Request eye exam       Relevant Medications   olmesartan-hydrochlorothiazide (BENICAR HCT) 40-25 MG tablet   glucose blood (ONETOUCH ULTRA) test strip   tirzepatide (MOUNJARO) 2.5 MG/0.5ML Pen   Other Relevant Orders   Hemoglobin A1c   Microalbumin / creatinine urine ratio   Essential hypertension (Chronic)   Controlled BP with normal exam. Current regimen is Benicar hct and amlodipine. Will continue same medications; encourage continued reduced sodium diet.       Relevant Medications    olmesartan-hydrochlorothiazide (BENICAR HCT) 40-25 MG tablet   amLODipine (NORVASC) 5 MG tablet   Other Relevant Orders   CBC with Differential/Platelet   Comprehensive metabolic panel   TSH   Other Visit Diagnoses       Annual physical exam    -  Primary   Relevant Orders   CBC with Differential/Platelet   Comprehensive metabolic panel   Hemoglobin A1c   Lipid panel   Microalbumin / creatinine urine ratio   TSH     Encounter for screening mammogram for breast cancer       scheduled     Long term current use of oral hypoglycemic drug         Immunization due       Relevant Orders   Pneumococcal conjugate vaccine 20-valent (Completed)     Post-menopausal bleeding       Will check CBC, Pelvic US refer to [facility]   Relevant Orders   CBC with Differential/Platelet   US Pelvic Complete With Transvaginal   Ambulatory referral to Obstetrics / Gynecology       No follow-ups on file.    Reubin Milan, MD [place]  [facility] [place]

## 2023-04-21 LAB — COMPREHENSIVE METABOLIC PANEL
ALT: 36 IU/L — ABNORMAL HIGH (ref 0–32)
AST: 37 IU/L (ref 0–40)
Albumin: 4.8 g/dL (ref 3.9–4.9)
Alkaline Phosphatase: 85 IU/L (ref 44–121)
BUN/Creatinine Ratio: 25 (ref 12–28)
BUN: 19 mg/dL (ref 8–27)
Bilirubin Total: 0.2 mg/dL (ref 0.0–1.2)
CO2: 19 mmol/L — ABNORMAL LOW (ref 20–29)
Calcium: 10 mg/dL (ref 8.7–10.3)
Chloride: 98 mmol/L (ref 96–106)
Creatinine, Ser: 0.76 mg/dL (ref 0.57–1.00)
Globulin, Total: 2.4 g/dL (ref 1.5–4.5)
Glucose: 87 mg/dL (ref 70–99)
Potassium: 4.3 mmol/L (ref 3.5–5.2)
Sodium: 142 mmol/L (ref 134–144)
Total Protein: 7.2 g/dL (ref 6.0–8.5)
eGFR: 89 mL/min/{1.73_m2} (ref >59)

## 2023-04-21 LAB — LIPID PANEL
Chol/HDL Ratio: 3 ratio (ref 0.0–4.4)
Cholesterol, Total: 134 mg/dL (ref 100–199)
HDL: 44 mg/dL (ref >39)
LDL Chol Calc (NIH): 70 mg/dL (ref 0–99)
Triglycerides: 111 mg/dL (ref 0–149)
VLDL Cholesterol Cal: 20 mg/dL (ref 5–40)

## 2023-04-21 LAB — CBC WITH DIFFERENTIAL/PLATELET
Basophils Absolute: 0 10*3/uL (ref 0.0–0.2)
Basos: 0 %
EOS (ABSOLUTE): 0.1 10*3/uL (ref 0.0–0.4)
Eos: 1 %
Hematocrit: 43.8 % (ref 34.0–46.6)
Hemoglobin: 14.6 g/dL (ref 11.1–15.9)
Immature Grans (Abs): 0 10*3/uL (ref 0.0–0.1)
Immature Granulocytes: 0 %
Lymphocytes Absolute: 3.7 10*3/uL — ABNORMAL HIGH (ref 0.7–3.1)
Lymphs: 31 %
MCH: 31.3 pg (ref 26.6–33.0)
MCHC: 33.3 g/dL (ref 31.5–35.7)
MCV: 94 fL (ref 79–97)
Monocytes Absolute: 0.6 10*3/uL (ref 0.1–0.9)
Monocytes: 5 %
Neutrophils Absolute: 7.3 10*3/uL — ABNORMAL HIGH (ref 1.4–7.0)
Neutrophils: 63 %
Platelets: 283 10*3/uL (ref 150–450)
RBC: 4.67 x10E6/uL (ref 3.77–5.28)
RDW: 13.6 % (ref 11.7–15.4)
WBC: 11.8 10*3/uL — ABNORMAL HIGH (ref 3.4–10.8)

## 2023-04-21 LAB — MICROALBUMIN / CREATININE URINE RATIO
Creatinine, Urine: 61.3 mg/dL
Microalb/Creat Ratio: <5 mg/g{creat} (ref 0–29)
Microalbumin, Urine: <3 ug/mL

## 2023-04-21 LAB — HEMOGLOBIN A1C
Est. average glucose Bld gHb Est-mCnc: 134 mg/dL
Hgb A1c MFr Bld: 6.3 % — ABNORMAL HIGH (ref 4.8–5.6)

## 2023-04-21 LAB — TSH: TSH: 0.947 u[IU]/mL (ref 0.450–4.500)

## 2023-04-22 ENCOUNTER — Encounter: Payer: Self-pay | Admitting: Internal Medicine

## 2023-04-22 ENCOUNTER — Ambulatory Visit
Admission: RE | Admit: 2023-04-22 | Discharge: 2023-04-22 | Disposition: A | Discharge status: Home / Self Care | Source: Ambulatory Visit | Attending: Internal Medicine | Admitting: Internal Medicine

## 2023-04-22 DIAGNOSIS — N950 Postmenopausal bleeding: Secondary | ICD-10-CM | POA: Diagnosis present

## 2023-04-22 DIAGNOSIS — E559 Vitamin D deficiency, unspecified: Secondary | ICD-10-CM | POA: Insufficient documentation

## 2023-05-02 ENCOUNTER — Encounter: Payer: Self-pay | Admitting: Internal Medicine

## 2023-05-03 NOTE — Telephone Encounter (Signed)
 Please review

## 2023-05-04 ENCOUNTER — Encounter: Payer: Self-pay | Admitting: Internal Medicine

## 2023-05-04 LAB — VITAMIN D 1,25 DIHYDROXY
Vitamin D 1, 25 (OH)2 Total: 56 pg/mL
Vitamin D2 1, 25 (OH)2: 13 pg/mL
Vitamin D3 1, 25 (OH)2: 43 pg/mL

## 2023-05-04 LAB — SPECIMEN STATUS REPORT

## 2023-05-11 ENCOUNTER — Other Ambulatory Visit: Payer: Self-pay | Admitting: Internal Medicine

## 2023-05-11 ENCOUNTER — Encounter: Payer: Self-pay | Admitting: Internal Medicine

## 2023-05-11 DIAGNOSIS — E118 Type 2 diabetes mellitus with unspecified complications: Secondary | ICD-10-CM

## 2023-05-11 MED ORDER — TIRZEPATIDE 5 MG/0.5ML ~~LOC~~ SOAJ
5.0000 mg | SUBCUTANEOUS | 0 refills | Status: DC
Start: 2023-05-11 — End: 2023-06-01

## 2023-05-12 ENCOUNTER — Ambulatory Visit
Admission: RE | Admit: 2023-05-12 | Discharge: 2023-05-12 | Disposition: A | Discharge status: Home / Self Care | Payer: 59 | Source: Ambulatory Visit | Attending: Internal Medicine | Admitting: Internal Medicine

## 2023-05-12 DIAGNOSIS — Z1231 Encounter for screening mammogram for malignant neoplasm of breast: Secondary | ICD-10-CM | POA: Diagnosis present

## 2023-05-31 ENCOUNTER — Other Ambulatory Visit: Payer: Self-pay | Admitting: Internal Medicine

## 2023-05-31 DIAGNOSIS — E118 Type 2 diabetes mellitus with unspecified complications: Secondary | ICD-10-CM

## 2023-05-31 NOTE — Telephone Encounter (Signed)
 Requested medications are due for refill today.  yes  Requested medications are on the active medications list.  yes  Last refill. 05/11/2023 2mL 0 rf  Future visit scheduled.   yes  Notes to clinic.  Medication not assigned to a protocol, please review for refill.    Requested Prescriptions  Pending Prescriptions Disp Refills   MOUNJARO 5 MG/0.5ML Pen [Pharmacy Med Name: MOUNJARO PEN 5MG /0.5ML] 2 mL 11    Sig: INJECT THE CONTENTS OF ONE PEN  SUBCUTANEOUSLY WEEKLY AS  DIRECTED     Off-Protocol Failed - 05/31/2023  5:53 PM      Failed - Medication not assigned to a protocol, review manually.      Passed - Valid encounter within last 12 months    Recent Outpatient Visits           1 month ago Annual physical exam   [facility] at [facility], Chales [place], MD       Future Appointments             In 2 months Gala Jubilee, Chales [place], MD [facility] at [facility], [facility]

## 2023-06-01 ENCOUNTER — Ambulatory Visit (INDEPENDENT_AMBULATORY_CARE_PROVIDER_SITE_OTHER): Admitting: Obstetrics

## 2023-06-01 ENCOUNTER — Other Ambulatory Visit (HOSPITAL_COMMUNITY)
Admission: RE | Admit: 2023-06-01 | Discharge: 2023-06-01 | Disposition: A | Discharge status: Home / Self Care | Source: Ambulatory Visit | Attending: Obstetrics | Admitting: Obstetrics

## 2023-06-01 VITALS — BP 113/72 | HR 86 | Ht 65.0 in | Wt 268.0 lb

## 2023-06-01 DIAGNOSIS — N950 Postmenopausal bleeding: Secondary | ICD-10-CM | POA: Insufficient documentation

## 2023-06-01 DIAGNOSIS — N858 Other specified noninflammatory disorders of uterus: Secondary | ICD-10-CM

## 2023-06-01 DIAGNOSIS — N8501 Benign endometrial hyperplasia: Secondary | ICD-10-CM | POA: Diagnosis not present

## 2023-06-01 NOTE — Telephone Encounter (Signed)
 Please review

## 2023-06-01 NOTE — Progress Notes (Signed)
 GYNECOLOGY PROGRESS NOTE  Subjective:  PCP: Sheron Dixons, MD  Patient ID: Anita Krueger, female    DOB: Oct 31, 1960, 63 y.o.   MRN: [id number]  HPI  Patient is a 63 y.o. G7P0020 female who presents as referral from PCP Janna Melter MD for PMB. Pt had a pelvic ultrasound on 04/22/23 showing fibroids, endometrium not visuzlied. Had bleeding mid-January '25 that lasted 2-3 weeks, then stopped. This is a chronic problem for her. Per chart review, she was seeing Dr. Cleora Daft. EMB 01/19/17 showed complex endometrial hyperplasia without atypia and was placed on Progestin. Subsequent biopsies 10/2017 and 09/2018 were both negative. She continued bleeding on Megace , was recommended to have a Hysteroscopy D&C, but cancelled due to cessation of bleeding. Chart notes bleeding returned around 05/2019, pt advised to follow up for further management and was lost to follow up.   GYN Hx Post-menopausal in her mid-50's  Last pap:  03/26/22, NILM, HPV neg; denies hx of abnml STI Hx: denies  OB History  Gravida Para Term Preterm AB Living  2    2   SAB IAB Ectopic Multiple Live Births          # Outcome Date GA Lbr Len/2nd Weight Sex Type Anes PTL Lv  2 AB           1 AB            Past Medical History:  Diagnosis Date   Arthritis    Bell's palsy 2016   right eye droops   Diabetes mellitus without complication (HCC)    Hyperlipidemia    Hypertension    Mood disorder (HCC) 06/29/2019   Patient Active Problem List   Diagnosis Date Noted   PMB (postmenopausal bleeding) 06/01/2023   Vitamin D  deficiency 04/22/2023   Primary osteoarthritis of right knee 11/27/2022   Need for influenza vaccination 11/27/2022   Hypercalcemia 08/13/2022   Patellar tendonitis of right knee 11/07/2020   Tubular adenoma of colon 04/03/2018   Iron deficiency anemia 01/24/2018   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   Menorrhagia with irregular cycle 01/19/2017   Hip pain, acute, left 05/12/2016    Essential hypertension 04/04/2015   Type II diabetes mellitus with complication (HCC) 01/27/2015   Hyperlipidemia associated with type 2 diabetes mellitus (HCC) 09/29/2014   Past Surgical History:  Procedure Laterality Date   bell's palsy  2016   right eye sluggish   COLONOSCOPY WITH PROPOFOL  N/A 03/31/2018   Procedure: COLONOSCOPY WITH PROPOFOL ;  Surgeon: Luke Salaam, MD;  Location: [facility] ENDOSCOPY;  Service: Gastroenterology;  Laterality: N/A;   DILATATION & CURETTAGE/HYSTEROSCOPY WITH MYOSURE N/A 03/31/2017   Procedure: DILATATION & CURETTAGE/HYSTEROSCOPY WITH MYOSURE;  Surgeon: Kris Pester, MD;  Location: [facility] ORS;  Service: Gynecology;  Laterality: N/A;   LAPAROSCOPY     Family History  Problem Relation Age of Onset   Hypertension Mother    Diabetes Mother    Hypertension Father    Breast cancer Cousin        PAT COUSIN   Social History   Socioeconomic History   Marital status: Single    Spouse name: Not on file   Number of children: Not on file   Years of education: Not on file   Highest education level: Not on file  Occupational History   Not on file  Tobacco Use   Smoking status: Former    Types: Cigarettes   Smokeless tobacco: Never   Tobacco comments:    40  years ago  Vaping Use   Vaping status: Not on file  Substance and Sexual Activity   Alcohol use: Yes    Alcohol/week: 2.0 standard drinks of alcohol    Types: 2 Shots of liquor per week    Comment: occasional   Drug use: No   Sexual activity: Not Currently    Birth control/protection: None  Other Topics Concern   Not on file  Social History Narrative   Not on file   Social Drivers of Health   Financial Resource Strain: Low Risk  (11/27/2021)   Overall Financial Resource Strain (CARDIA)    Difficulty of Paying Living Expenses: Not hard at all  Food Insecurity: No Food Insecurity (04/19/2023)   Hunger Vital Sign    Worried About Running Out of Food in the Last Year: Never true    Ran Out of  Food in the Last Year: Never true  Transportation Needs: No Transportation Needs (04/19/2023)   PRAPARE - Administrator, Civil Service (Medical): No    Lack of Transportation (Non-Medical): No  Physical Activity: Not on file  Stress: Not on file  Social Connections: Not on file  Intimate Partner Violence: Not At Risk (04/19/2023)   Humiliation, Afraid, Rape, and Kick questionnaire    Fear of Current or Ex-Partner: No    Emotionally Abused: No    Physically Abused: No    Sexually Abused: No   Current Outpatient Medications on File Prior to Visit  Medication Sig Dispense Refill   Acetaminophen  (TYLENOL  PO) Take by mouth as needed.     amLODipine  (NORVASC ) 5 MG tablet Take 1 tablet (5 mg total) by mouth daily. 90 tablet 3   Blood Glucose Monitoring Suppl (CONTOUR NEXT MONITOR) w/Device KIT USE AS DIRECTED 1 kit 0   Carboxymethylcellul-Glycerin 1-0.25 % SOLN Place 1 drop into both eyes 3 (three) times daily as needed (dry eyes).     carboxymethylcellulose (REFRESH PLUS) 0.5 % SOLN Place 1 drop into both eyes 3 (three) times daily as needed (dry eyes).     cholecalciferol (VITAMIN D3) 25 MCG (1000 UNIT) tablet Take 1,000 Units by mouth daily.     clotrimazole -betamethasone  (LOTRISONE ) cream Apply 1 Application topically daily. 30 g 2   empagliflozin  (JARDIANCE ) 25 MG TABS tablet Take 1 tablet (25 mg total) by mouth daily before breakfast. 90 tablet 3   Ferrous Sulfate (IRON PO) Take by mouth daily.     glucose blood (ONETOUCH ULTRA) test strip USE 1 STRIP UP TO 4 TIMES  DAILY AS DIRECTED 100 strip 12   Lancets (ONETOUCH DELICA PLUS LANCET33G) MISC 1 each by Does not apply route in the morning and at bedtime. 100 each 3   loratadine (CLARITIN) 10 MG tablet Take 10 mg by mouth daily as needed for allergies.     metFORMIN  (GLUCOPHAGE -XR) 500 MG 24 hr tablet TAKE 1 TABLET BY MOUTH TWICE  DAILY BEFORE A MEAL 180 tablet 3   olmesartan -hydrochlorothiazide (BENICAR  HCT) 40-25 MG tablet  Take 1 tablet by mouth daily. 90 tablet 3   Probiotic Product (PROBIOTIC PO) Take by mouth daily.     rosuvastatin  (CRESTOR ) 10 MG tablet Take 1 tablet (10 mg total) by mouth daily. 90 tablet 3   tirzepatide (MOUNJARO) 5 MG/0.5ML Pen INJECT THE CONTENTS OF ONE PEN  SUBCUTANEOUSLY WEEKLY AS  DIRECTED 6 mL 1   UNABLE TO FIND as needed. Med Name: biofreeze     No current facility-administered medications on file prior to visit.  No Known Allergies  Review of Systems Pertinent items are noted in HPI.   Objective:   Blood pressure 113/72, pulse 86, height 5\' 5"  (1.651 m), weight 268 lb (121.6 kg), last menstrual period 05/22/2018. Body mass index is 44.6 kg/m.  General appearance: alert, cooperative, and morbidly obese Abdomen: soft, non-tender; bowel sounds normal; no masses,  no organomegaly Pelvic: cervix normal in appearance, no adnexal masses or tenderness, no cervical motion tenderness, rectovaginal septum normal, and atrophic vulvovaginal mucosa Extremities: edema bilaterally in ankles and no edema, redness or tenderness in the calves or thighs Neurologic: Grossly normal  Endometrial Biopsy Procedure Note  The patient was positioned on the exam table in the dorsal lithotomy position. Bimanual exam confirmed uterine position and size. A Graves speculum was placed into the vagina. A single toothed tenaculum was placed onto the anterior lip of the cervix. The pipette was placed into the endocervical canal and advanced to the uterine fundus. Using a piston like technique, with vacuum created by withdrawing the stylus, the endometrial specimen was obtained and transferred to the biopsy container. Minimal bleeding encountered. The procedure was well tolerated.   Uterine Position: anterior   Uterine Length: 7cm   Uterine Specimen: Scant   04/22/23 US  PELVIC IMPRESSION: *Heterogeneous echogenic uterus could correlate with fibroid degeneration with calcified uterine  fibroids. *Nonvisualization of the ovaries. *No free fluid in the cul-de-sac. *Endometrium not visualized. *No adnexal mass. *No evidence of ovarian torsion.  Assessment/Plan:   1. PMB (postmenopausal bleeding)   2. Complex endometrial hyperplasia without atypia     63 y.o. G2P0020 with history of complex hyperplasia without atypia in 2018 with 2 negative biopsies in 10/2017 and 09/2018, now with continued PMB and not on Progestin therapy since 2021. US  from 04/22/23 shows fibroid degeneration with calcified fibroids and EMT "not visualized." EMB performed today to assess status of her lining. If negative, will discuss treatment for her fibroids. If positive, will continue with treatment for hyperplasia. Follow up in 2 wks for results and further management discussion.  Total time was 35 minutes. That includes chart review before the visit, the actual patient visit, and time spent on documentation after the visit. Time excludes procedures, if any.    Sofia Dunn, DO  [facility]

## 2023-06-07 LAB — SURGICAL PATHOLOGY

## 2023-06-08 ENCOUNTER — Encounter: Payer: Self-pay | Admitting: Obstetrics

## 2023-06-08 ENCOUNTER — Other Ambulatory Visit: Payer: Self-pay | Admitting: Internal Medicine

## 2023-06-08 DIAGNOSIS — E118 Type 2 diabetes mellitus with unspecified complications: Secondary | ICD-10-CM

## 2023-06-08 MED ORDER — TIRZEPATIDE 7.5 MG/0.5ML ~~LOC~~ SOAJ
7.5000 mg | SUBCUTANEOUS | 0 refills | Status: DC
Start: 2023-06-08 — End: 2023-06-30

## 2023-06-08 NOTE — Telephone Encounter (Signed)
 Please review

## 2023-06-22 ENCOUNTER — Ambulatory Visit: Admitting: Obstetrics

## 2023-06-25 ENCOUNTER — Other Ambulatory Visit: Payer: Self-pay | Admitting: Internal Medicine

## 2023-06-25 DIAGNOSIS — E1169 Type 2 diabetes mellitus with other specified complication: Secondary | ICD-10-CM

## 2023-06-25 DIAGNOSIS — E118 Type 2 diabetes mellitus with unspecified complications: Secondary | ICD-10-CM

## 2023-06-28 NOTE — Telephone Encounter (Signed)
 Requested Prescriptions  Pending Prescriptions Disp Refills   empagliflozin  (JARDIANCE ) 25 MG TABS tablet [Pharmacy Med Name: Jardiance  25 MG Oral Tablet] 90 tablet 0    Sig: TAKE 1 TABLET BY MOUTH DAILY  BEFORE BREAKFAST     Endocrinology:  Diabetes - SGLT2 Inhibitors Passed - 06/28/2023  9:44 AM      Passed - Cr in normal range and within 360 days    Creat  Date Value Ref Range Status  01/03/2023 1.13 (H) 0.50 - 1.05 mg/dL Final   Creatinine, Ser  Date Value Ref Range Status  04/19/2023 0.76 0.57 - 1.00 mg/dL Final         Passed - HBA1C is between 0 and 7.9 and within 180 days    Hgb A1c MFr Bld  Date Value Ref Range Status  04/19/2023 6.3 (H) 4.8 - 5.6 % Final    Comment:             Prediabetes: 5.7 - 6.4          Diabetes: >6.4          Glycemic control for adults with diabetes: <7.0          Passed - eGFR in normal range and within 360 days    GFR calc Af Amer  Date Value Ref Range Status  01/28/2020 86 >59 mL/min/1.73 Final    Comment:    **In accordance with recommendations from the NKF-ASN Task force,**   [facility] is in the process of updating its eGFR calculation to the   2021 CKD-EPI creatinine equation that estimates kidney function   without a race variable.    GFR calc non Af Amer  Date Value Ref Range Status  01/28/2020 75 >59 mL/min/1.73 Final   eGFR  Date Value Ref Range Status  04/19/2023 89 >59 mL/min/1.73 Final         Passed - Valid encounter within last 6 months    Recent Outpatient Visits           2 months ago Annual physical exam   [place] [facility] at [address] [facility], Chales [place], MD       Future Appointments             In 1 month Gala Jubilee, Chales [place], MD [facility] Primary Care & Sports Medicine at [facility], [facility]             metFORMIN  (GLUCOPHAGE -XR) 500 MG 24 hr tablet [Pharmacy Med Name: metFORMIN  HCl ER 500 MG Oral Tablet Extended Release 24 Hour] 180 tablet 0    Sig: TAKE 1 TABLET BY  MOUTH TWICE  DAILY BEFORE A MEAL     Endocrinology:  Diabetes - Biguanides Failed - 06/28/2023  9:44 AM      Failed - B12 Level in normal range and within 720 days    No results found for: "VITAMINB12"       Passed - Cr in normal range and within 360 days    Creat  Date Value Ref Range Status  01/03/2023 1.13 (H) 0.50 - 1.05 mg/dL Final   Creatinine, Ser  Date Value Ref Range Status  04/19/2023 0.76 0.57 - 1.00 mg/dL Final         Passed - HBA1C is between 0 and 7.9 and within 180 days    Hgb A1c MFr Bld  Date Value Ref Range Status  04/19/2023 6.3 (H) 4.8 - 5.6 % Final    Comment:  Prediabetes: 5.7 - 6.4          Diabetes: >6.4          Glycemic control for adults with diabetes: <7.0          Passed - eGFR in normal range and within 360 days    GFR calc Af Amer  Date Value Ref Range Status  01/28/2020 86 >59 mL/min/1.73 Final    Comment:    **In accordance with recommendations from the NKF-ASN Task force,**   [facility] is in the process of updating its eGFR calculation to the   2021 CKD-EPI creatinine equation that estimates kidney function   without a race variable.    GFR calc non Af Amer  Date Value Ref Range Status  01/28/2020 75 >59 mL/min/1.73 Final   eGFR  Date Value Ref Range Status  04/19/2023 89 >59 mL/min/1.73 Final         Passed - Valid encounter within last 6 months    Recent Outpatient Visits           2 months ago Annual physical exam    Primary Care & Sports Medicine at [facility], Chales [place], MD       Future Appointments             In 1 month Gala Jubilee Chales [place], MD [facility] Medicine at [facility], [facility]            Passed - CBC within normal limits and completed in the last 12 months    WBC  Date Value Ref Range Status  04/19/2023 11.8 (H) 3.4 - 10.8 x10E3/uL Final   RBC  Date Value Ref Range Status  04/19/2023 4.67 3.77 - 5.28 x10E6/uL Final  04/04/2015 2 (A) 4.04 -  5.48 M/uL Final   Hemoglobin  Date Value Ref Range Status  04/19/2023 14.6 11.1 - 15.9 g/dL Final   Hematocrit  Date Value Ref Range Status  04/19/2023 43.8 34.0 - 46.6 % Final   MCHC  Date Value Ref Range Status  04/19/2023 33.3 31.5 - 35.7 g/dL Final   [facility]  Date Value Ref Range Status  04/19/2023 31.3 26.6 - 33.0 pg Final   MCV  Date Value Ref Range Status  04/19/2023 94 79 - 97 fL Final   No results found for: "PLTCOUNTKUC", "LABPLAT", "POCPLA" RDW  Date Value Ref Range Status  04/19/2023 13.6 11.7 - 15.4 % Final          rosuvastatin  (CRESTOR ) 10 MG tablet [Pharmacy Med Name: Rosuvastatin  Calcium  10 MG Oral Tablet] 90 tablet 3    Sig: TAKE 1 TABLET BY MOUTH DAILY     Cardiovascular:  Antilipid - Statins 2 Failed - 06/28/2023  9:44 AM      Failed - Lipid Panel in normal range within the last 12 months    Cholesterol, Total  Date Value Ref Range Status  04/19/2023 134 100 - 199 mg/dL Final   LDL Chol Calc (NIH)  Date Value Ref Range Status  04/19/2023 70 0 - 99 mg/dL Final   HDL  Date Value Ref Range Status  04/19/2023 44 >39 mg/dL Final   Triglycerides  Date Value Ref Range Status  04/19/2023 111 0 - 149 mg/dL Final         Passed - Cr in normal range and within 360 days    Creat  Date Value Ref Range Status  01/03/2023 1.13 (H) 0.50 - 1.05 mg/dL Final  Creatinine, Ser  Date Value Ref Range Status  04/19/2023 0.76 0.57 - 1.00 mg/dL Final         Passed - Patient is not pregnant      Passed - Valid encounter within last 12 months    Recent Outpatient Visits           2 months ago Annual physical exam   [facility] Primary Care & Sports Medicine at [facility], Chales [place], MD       Future Appointments             In 1 month Gala Jubilee, [place]ales [place], MD [facility] Primary Care & Sports Medicine at [facility], [facility]

## 2023-06-30 ENCOUNTER — Other Ambulatory Visit: Payer: Self-pay | Admitting: Internal Medicine

## 2023-06-30 DIAGNOSIS — E118 Type 2 diabetes mellitus with unspecified complications: Secondary | ICD-10-CM

## 2023-06-30 MED ORDER — TIRZEPATIDE 10 MG/0.5ML ~~LOC~~ SOAJ: 10.0000 mg | SUBCUTANEOUS | 0 refills | Status: DC

## 2023-06-30 NOTE — Telephone Encounter (Signed)
 Please review. JM

## 2023-07-05 NOTE — Progress Notes (Unsigned)
    GYNECOLOGY PROGRESS NOTE  Subjective:  PCP: Sheron Dixons, MD  Patient ID: Anita Krueger, female    DOB: Dec 01, 1960, 63 y.o.   MRN: [id number]  HPI  Patient is a 63 y.o. G21P0020 female who presents for results from visit 06/01/23. PMH of EH w/o atypia in 2018, with 2 negative EMB in 10/2017 and 09/2018, not on Progestin therapy since 2021, and referred to [facility] for recurrent PMB. We performed EMB on 4/23 and pt here today to discuss results and treatment options. No further bleeding since Jan '25.   The following portions of the patient's history were reviewed and updated as appropriate: allergies, current medications, past family history, past medical history, past social history, past surgical history, and problem list.  Review of Systems Pertinent items are noted in HPI.   Objective:   Blood pressure 109/71, pulse 90, height 5\' 5"  (1.651 m), weight 259 lb (117.5 kg), last menstrual period 05/22/2018. Body mass index is 43.1 kg/m.  General appearance: alert, cooperative, no distress, and morbidly obese Pelvic: deferred Extremities: extremities normal, atraumatic, no cyanosis or edema Neurologic: Grossly normal  PATHOLOGY FINAL MICROSCOPIC DIAGNOSIS:   A. ENDOMETRIUM, BIOPSY:  - Predominantly inactive endometrium with focal cytologic atypia, see  comment   COMMENT:  Differential diagnosis includes metaplasia with atypia.  However, focal  endometrial intraepithelial neoplasia (EIN) cannot be ruled out.  Dr.  Portia Brittle reviewed the case and concurs with the diagnosis.   Assessment/Plan:   1. Endometrial hyperplasia with atypia    We reviewed recommendations for a hysterectomy and pt does not feel ready for surgery at this time. Discussed Progestin options available, including IUD as first-line most effective therapy with higher regression rates and lower recurrence, and pt would prefer oral therapy for now, open to IUD if oral therapy not therapeutic or finding unable to  tolerate.  -Rx for Megace  40mg  daily -- SE and dosing reviewed -Repeat EMB in 6 mos  Follow up in 6 mos, sooner prn  Total time was 30 minutes. That includes chart review before the visit, the actual patient visit, and time spent on documentation after the visit. Time excludes procedures, if any.     Sofia Dunn, DO [facility]

## 2023-07-06 ENCOUNTER — Encounter: Payer: Self-pay | Admitting: Obstetrics

## 2023-07-06 ENCOUNTER — Ambulatory Visit (INDEPENDENT_AMBULATORY_CARE_PROVIDER_SITE_OTHER): Admitting: Obstetrics

## 2023-07-06 VITALS — BP 109/71 | HR 90 | Ht 65.0 in | Wt 259.0 lb

## 2023-07-06 DIAGNOSIS — N8502 Endometrial intraepithelial neoplasia [EIN]: Secondary | ICD-10-CM | POA: Diagnosis not present

## 2023-07-08 ENCOUNTER — Other Ambulatory Visit: Payer: Self-pay | Admitting: Obstetrics

## 2023-07-08 MED ORDER — MEGESTROL ACETATE 40 MG PO TABS: 40.0000 mg | ORAL_TABLET | Freq: Every day | ORAL | 3 refills | Status: DC

## 2023-07-08 NOTE — Telephone Encounter (Signed)
 Your note say's Rx for Megace  40mg  daily . Do you want me to put the order in?

## 2023-07-11 ENCOUNTER — Other Ambulatory Visit: Payer: Self-pay

## 2023-07-11 DIAGNOSIS — N950 Postmenopausal bleeding: Secondary | ICD-10-CM

## 2023-07-11 MED ORDER — MEGESTROL ACETATE 40 MG PO TABS: 40.0000 mg | ORAL_TABLET | Freq: Every day | ORAL | 3 refills | Status: DC

## 2023-07-21 ENCOUNTER — Other Ambulatory Visit: Payer: Self-pay | Admitting: Obstetrics

## 2023-07-21 DIAGNOSIS — N8502 Endometrial intraepithelial neoplasia [EIN]: Secondary | ICD-10-CM

## 2023-07-21 MED ORDER — MEDROXYPROGESTERONE ACETATE 10 MG PO TABS: 20.0000 mg | ORAL_TABLET | Freq: Every day | ORAL | 3 refills | Status: DC

## 2023-07-22 ENCOUNTER — Other Ambulatory Visit: Payer: Self-pay | Admitting: Internal Medicine

## 2023-07-22 DIAGNOSIS — E118 Type 2 diabetes mellitus with unspecified complications: Secondary | ICD-10-CM

## 2023-07-25 NOTE — Telephone Encounter (Signed)
 Requested medications are due for refill today.  yes  Requested medications are on the active medications list.  yes  Last refill. 06/30/2023 2mL 0 rf  Future visit scheduled.   yes  Notes to clinic.  Medication not assigned to a protocol. Please review for refill.    Requested Prescriptions  Pending Prescriptions Disp Refills   MOUNJARO  10 MG/0.5ML Pen [Pharmacy Med Name: MOUNJARO  PEN 10MG /0.5ML] 2 mL 11    Sig: INJECT THE CONTENTS OF ONE PEN  SUBCUTANEOUSLY WEEKLY AS  DIRECTED     Off-Protocol Failed - 07/25/2023 12:56 PM      Failed - Medication not assigned to a protocol, review manually.      Passed - Valid encounter within last 12 months    Recent Outpatient Visits           3 months ago Annual physical exam   [facility] Primary Care & Sports Medicine at [facility], Chales [place], MD       Future Appointments             In 1 week Gala Jubilee, [place], MD [facility] Primary Care & Sports Medicine at [facility], [facility]

## 2023-08-02 ENCOUNTER — Other Ambulatory Visit: Payer: Self-pay

## 2023-08-02 DIAGNOSIS — E118 Type 2 diabetes mellitus with unspecified complications: Secondary | ICD-10-CM

## 2023-08-02 MED ORDER — MOUNJARO 10 MG/0.5ML ~~LOC~~ SOAJ: 10.0000 mg | SUBCUTANEOUS | 1 refills | Status: DC

## 2023-08-05 ENCOUNTER — Ambulatory Visit: Admitting: Internal Medicine

## 2023-08-05 ENCOUNTER — Encounter: Payer: Self-pay | Admitting: Internal Medicine

## 2023-08-05 VITALS — BP 124/72 | HR 95 | Ht 65.0 in | Wt 247.0 lb

## 2023-08-05 DIAGNOSIS — E118 Type 2 diabetes mellitus with unspecified complications: Secondary | ICD-10-CM

## 2023-08-05 DIAGNOSIS — Z7984 Long term (current) use of oral hypoglycemic drugs: Secondary | ICD-10-CM | POA: Diagnosis not present

## 2023-08-05 DIAGNOSIS — I10 Essential (primary) hypertension: Secondary | ICD-10-CM

## 2023-08-05 LAB — POCT GLYCOSYLATED HEMOGLOBIN (HGB A1C): Hemoglobin A1C: 5.3 % (ref 4.0–5.6)

## 2023-08-05 NOTE — Assessment & Plan Note (Signed)
 Blood pressure is well controlled.  Current medications olmesartan , hydrochlorothiazide and amlodipine . Will continue same regimen along with efforts to limit dietary sodium.

## 2023-08-05 NOTE — Assessment & Plan Note (Signed)
 Seen by Endo Corrected calcium  normal PTH normal, vitamin D  low - now on supplements Repeat Vitamin D  normal range - continue 2000 international units daily.

## 2023-08-05 NOTE — Progress Notes (Signed)
 Date:  08/05/2023   Name:  Anita Krueger   DOB:  [birth year]   MRN:  [id number]   Chief Complaint: Diabetes and Hypertension  Hypertension This is a chronic problem. The problem is controlled. Pertinent negatives include no chest pain, headaches, palpitations or shortness of breath. Past treatments include calcium  channel blockers, angiotensin blockers and diuretics. The current treatment provides significant improvement.  Diabetes She presents for her follow-up diabetic visit. She has type 2 diabetes mellitus. Pertinent negatives for hypoglycemia include no dizziness or headaches. Pertinent negatives for diabetes include no chest pain, no fatigue and no weakness. Current diabetic treatments: MTF, Jardiance , Mounjaro .    Review of Systems  Constitutional:  Negative for fatigue and unexpected weight change.  HENT:  Negative for trouble swallowing.   Eyes:  Negative for visual disturbance.  Respiratory:  Negative for cough, chest tightness, shortness of breath and wheezing.   Cardiovascular:  Negative for chest pain, palpitations and leg swelling.  Gastrointestinal:  Negative for abdominal pain, constipation and diarrhea.  Musculoskeletal:  Negative for arthralgias and myalgias.  Neurological:  Negative for dizziness, weakness, light-headedness and headaches.     Lab Results  Component Value Date   NA 142 04/19/2023   K 4.3 04/19/2023   CO2 19 (L) 04/19/2023   GLUCOSE 87 04/19/2023   BUN 19 04/19/2023   CREATININE 0.76 04/19/2023   CALCIUM  10.0 04/19/2023   EGFR 89 04/19/2023   GFRNONAA 75 01/28/2020   Lab Results  Component Value Date   CHOL 134 04/19/2023   HDL 44 04/19/2023   LDLCALC 70 04/19/2023   TRIG 111 04/19/2023   CHOLHDL 3.0 04/19/2023   Lab Results  Component Value Date   TSH 0.947 04/19/2023   Lab Results  Component Value Date   HGBA1C 6.3 (H) 04/19/2023   Lab Results  Component Value Date   WBC 11.8 (H) 04/19/2023   HGB 14.6 04/19/2023   HCT 43.8  04/19/2023   MCV 94 04/19/2023   PLT 283 04/19/2023   Lab Results  Component Value Date   ALT 36 (H) 04/19/2023   AST 37 04/19/2023   ALKPHOS 85 04/19/2023   BILITOT 0.2 04/19/2023   Lab Results  Component Value Date   VD25OH 14 (L) 01/03/2023     Patient Active Problem List   Diagnosis Date Noted   Endometrial hyperplasia with atypia 07/06/2023   PMB (postmenopausal bleeding) 06/01/2023   Vitamin D  deficiency 04/22/2023   Primary osteoarthritis of right knee 11/27/2022   Need for influenza vaccination 11/27/2022   Hypercalcemia 08/13/2022   Patellar tendonitis of right knee 11/07/2020   Tubular adenoma of colon 04/03/2018   Iron deficiency anemia 01/24/2018   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   Menorrhagia with irregular cycle 01/19/2017   Hip pain, acute, left 05/12/2016   Essential hypertension 04/04/2015   Type II diabetes mellitus with complication (HCC) 01/27/2015   Hyperlipidemia associated with type 2 diabetes mellitus (HCC) 09/29/2014    No Known Allergies  Past Surgical History:  Procedure Laterality Date   bell's palsy  2016   right eye sluggish   COLONOSCOPY WITH PROPOFOL  N/A 03/31/2018   Procedure: COLONOSCOPY WITH PROPOFOL ;  Surgeon: Therisa Bi, MD;  Location: [facility] ENDOSCOPY;  Service: Gastroenterology;  Laterality: N/A;   DILATATION & CURETTAGE/HYSTEROSCOPY WITH MYOSURE N/A 03/31/2017   Procedure: DILATATION & CURETTAGE/HYSTEROSCOPY WITH MYOSURE;  Surgeon: Leonce Garnette BIRCH, MD;  Location: [facility] ORS;  Service: Gynecology;  Laterality: N/A;   LAPAROSCOPY  Social History   Tobacco Use   Smoking status: Former    Types: Cigarettes   Smokeless tobacco: Never   Tobacco comments:    40 years ago  Substance Use Topics   Alcohol use: Yes    Alcohol/week: 2.0 standard drinks of alcohol    Types: 2 Shots of liquor per week    Comment: occasional   Drug use: No     Medication list has been reviewed and updated.  Current  Meds  Medication Sig   Acetaminophen  (TYLENOL  PO) Take by mouth as needed.   amLODipine  (NORVASC ) 5 MG tablet Take 1 tablet (5 mg total) by mouth daily.   Blood Glucose Monitoring Suppl (CONTOUR NEXT MONITOR) w/Device KIT USE AS DIRECTED   Carboxymethylcellul-Glycerin 1-0.25 % SOLN Place 1 drop into both eyes 3 (three) times daily as needed (dry eyes).   carboxymethylcellulose (REFRESH PLUS) 0.5 % SOLN Place 1 drop into both eyes 3 (three) times daily as needed (dry eyes).   cholecalciferol (VITAMIN D3) 25 MCG (1000 UNIT) tablet Take 1,000 Units by mouth daily. (Patient taking differently: Take 2,000 Units by mouth daily.)   clotrimazole -betamethasone  (LOTRISONE ) cream Apply 1 Application topically daily.   empagliflozin  (JARDIANCE ) 25 MG TABS tablet TAKE 1 TABLET BY MOUTH DAILY  BEFORE BREAKFAST   Ferrous Sulfate (IRON PO) Take by mouth daily.   glucose blood (ONETOUCH ULTRA) test strip USE 1 STRIP UP TO 4 TIMES  DAILY AS DIRECTED   Lancets (ONETOUCH DELICA PLUS LANCET33G) MISC 1 each by Does not apply route in the morning and at bedtime.   loratadine (CLARITIN) 10 MG tablet Take 10 mg by mouth daily as needed for allergies.   medroxyPROGESTERone  (PROVERA ) 10 MG tablet Take 2 tablets (20 mg total) by mouth at bedtime.   metFORMIN  (GLUCOPHAGE -XR) 500 MG 24 hr tablet TAKE 1 TABLET BY MOUTH TWICE  DAILY BEFORE A MEAL (Patient taking differently: Take 500 mg by mouth daily with breakfast.)   olmesartan -hydrochlorothiazide (BENICAR  HCT) 40-25 MG tablet Take 1 tablet by mouth daily.   rosuvastatin  (CRESTOR ) 10 MG tablet TAKE 1 TABLET BY MOUTH DAILY   tirzepatide  (MOUNJARO ) 10 MG/0.5ML Pen Inject 10 mg into the skin once a week.   UNABLE TO FIND as needed. Med Name: biofreeze       08/05/2023   10:32 AM 04/19/2023    1:26 PM 11/25/2022   11:52 AM 08/13/2022    9:32 AM  GAD 7 : Generalized Anxiety Score  Nervous, Anxious, on Edge 2 2 1 1   Control/stop worrying 1 1 1 1   Worry too much - different  things 1 1 1 1   Trouble relaxing 1 1 1 1   Restless 1 1 1 1   Easily annoyed or irritable 1 1 1 1   Afraid - awful might happen 1 1 1 1   Total GAD 7 Score 8 8 7 7   Anxiety Difficulty Not difficult at all Somewhat difficult Not difficult at all Somewhat difficult       08/05/2023   10:32 AM 04/19/2023    1:26 PM 11/25/2022   11:51 AM  Depression screen PHQ 2/9  Decreased Interest 1 0 1  Down, Depressed, Hopeless 0 0 1  PHQ - 2 Score 1 0 2  Altered sleeping 1 2 2   Tired, decreased energy 1 2 2   Change in appetite 2 2 0  Feeling bad or failure about yourself  0 0 0  Trouble concentrating 0 2 1  Moving slowly or fidgety/restless 0 1  0  Suicidal thoughts 0 0 0  PHQ-9 Score 5 9 7   Difficult doing work/chores Not difficult at all Not difficult at all Not difficult at all    BP Readings from Last 3 Encounters:  08/05/23 124/72  07/06/23 109/71  06/01/23 113/72    Physical Exam Vitals and nursing note reviewed.  Constitutional:      General: She is not in acute distress.    Appearance: She is well-developed.  HENT:     Head: Normocephalic and atraumatic.   Cardiovascular:     Rate and Rhythm: Normal rate and regular rhythm.  Pulmonary:     Effort: Pulmonary effort is normal. No respiratory distress.     Breath sounds: No wheezing or rhonchi.   Musculoskeletal:     Cervical back: Normal range of motion and neck supple.     Right lower leg: No edema.     Left lower leg: No edema.   Skin:    General: Skin is warm and dry.     Capillary Refill: Capillary refill takes less than 2 seconds.     Findings: No rash.   Neurological:     General: No focal deficit present.     Mental Status: She is alert and oriented to person, place, and time.   Psychiatric:        Mood and Affect: Mood normal.        Behavior: Behavior normal.     Wt Readings from Last 3 Encounters:  08/05/23 247 lb (112 kg)  07/06/23 259 lb (117.5 kg)  06/01/23 268 lb (121.6 kg)    BP 124/72    Pulse 95   Ht 5' 5 (1.651 m)   Wt 247 lb (112 kg)   LMP 05/22/2018 (Exact Date)   SpO2 97%   BMI 41.10 kg/m   Assessment and Plan:  Problem List Items Addressed This Visit       Unprioritized   Type II diabetes mellitus with complication (HCC) (Chronic)   Blood sugars have been stable.  No recent hypoglycemic events requiring assistance. Currently medications are MTF, Jardiance  and Mounjaro . Lab Results  Component Value Date   HGBA1C 6.3 (H) 04/19/2023   Last visit Mounjaro  was increased to 10 mg. She has minimal GI complaints. A1C today = 5.3 Will reduce metformin  to once a day.      Relevant Orders   POCT glycosylated hemoglobin (Hb A1C)   Essential hypertension - Primary (Chronic)   Blood pressure is well controlled.  Current medications olmesartan , hydrochlorothiazide and amlodipine . Will continue same regimen along with efforts to limit dietary sodium.       Hypercalcemia (Chronic)   Seen by Endo Corrected calcium  normal PTH normal, vitamin D  low - now on supplements Repeat Vitamin D  normal range - continue 2000 international units daily.        Other Visit Diagnoses       Long term current use of oral hypoglycemic drug           No follow-ups on file.    Leita HILARIO Adie, MD [facility] Primary Care and Sports Medicine [place]

## 2023-08-05 NOTE — Assessment & Plan Note (Addendum)
 Blood sugars have been stable.  No recent hypoglycemic events requiring assistance. Currently medications are MTF, Jardiance  and Mounjaro . Lab Results  Component Value Date   HGBA1C 6.3 (H) 04/19/2023   Last visit Mounjaro  was increased to 10 mg. She has minimal GI complaints. A1C today = 5.3 Will reduce metformin  to once a day.

## 2023-09-10 IMAGING — MG MM DIGITAL SCREENING BILAT W/ TOMO AND CAD
8 of 14 series · 8 of 40 positions shown · non-contrast
Comparison: Previous exam(s).

CLINICAL DATA: Screening.

EXAM:
DIGITAL SCREENING BILATERAL MAMMOGRAM WITH TOMOSYNTHESIS AND CAD
TECHNIQUE: Bilateral screening digital craniocaudal and mediolateral oblique
mammograms were obtained. Bilateral screening digital breast
tomosynthesis was performed. The images were evaluated with
computer-aided detection.

[R MLO synth-2D (1 of 2)]
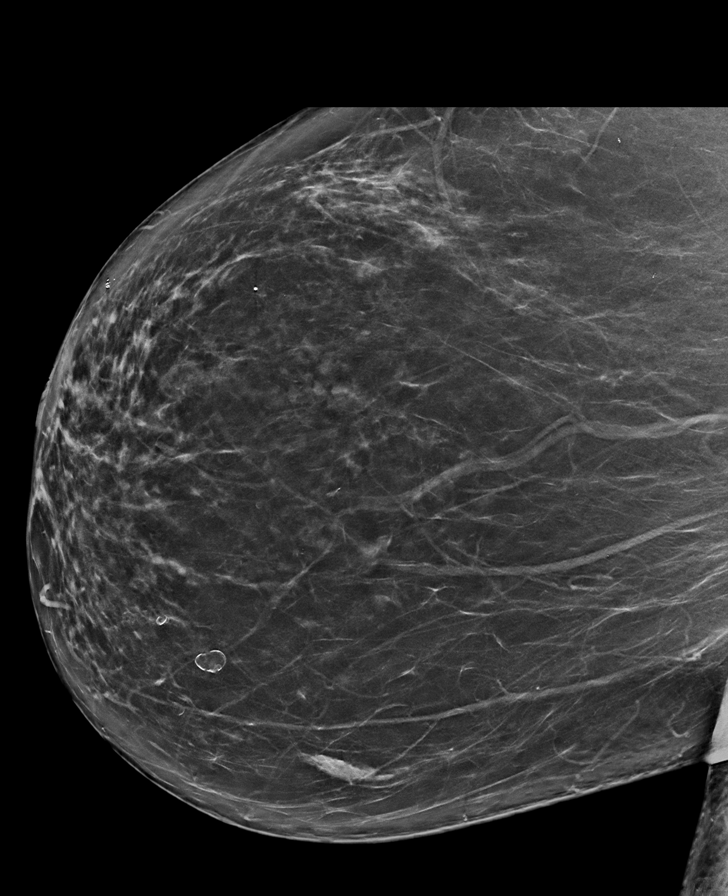

[R CC synth-2D (1 of 2)]
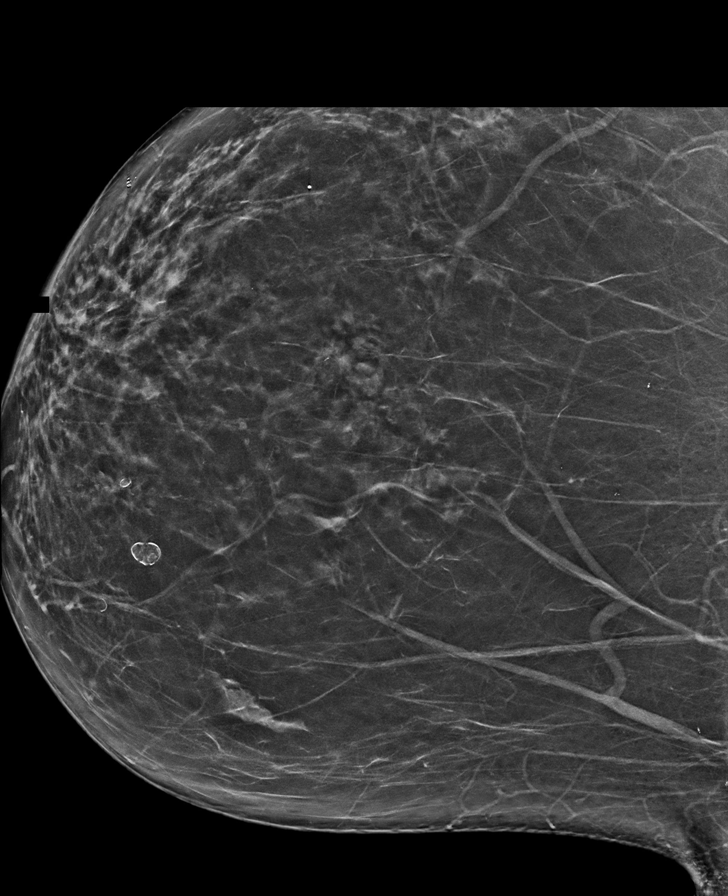

[L MLO synth-2D (1 of 2)]
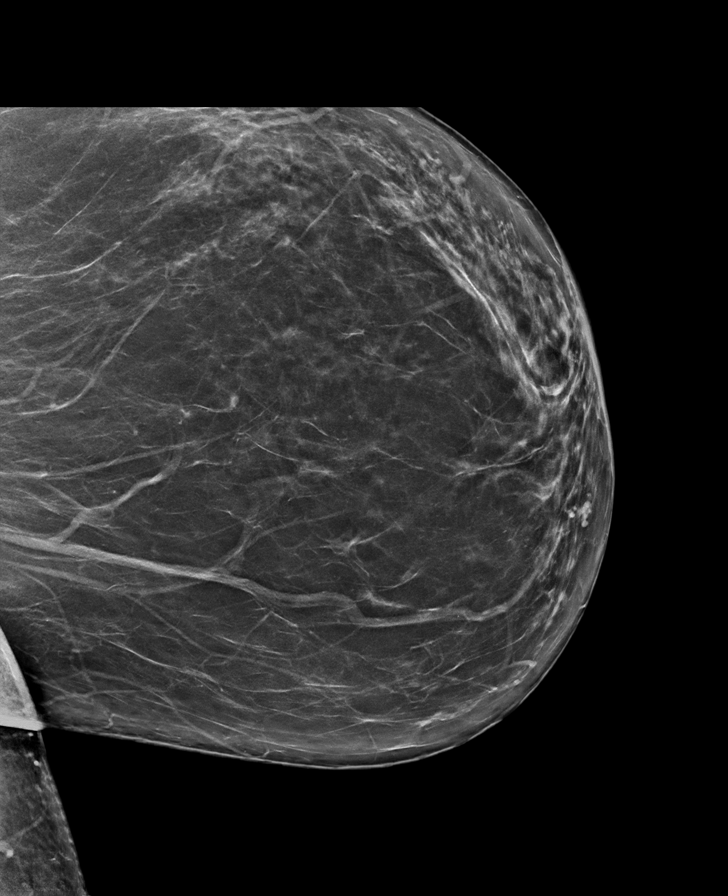

[L CC synth-2D]
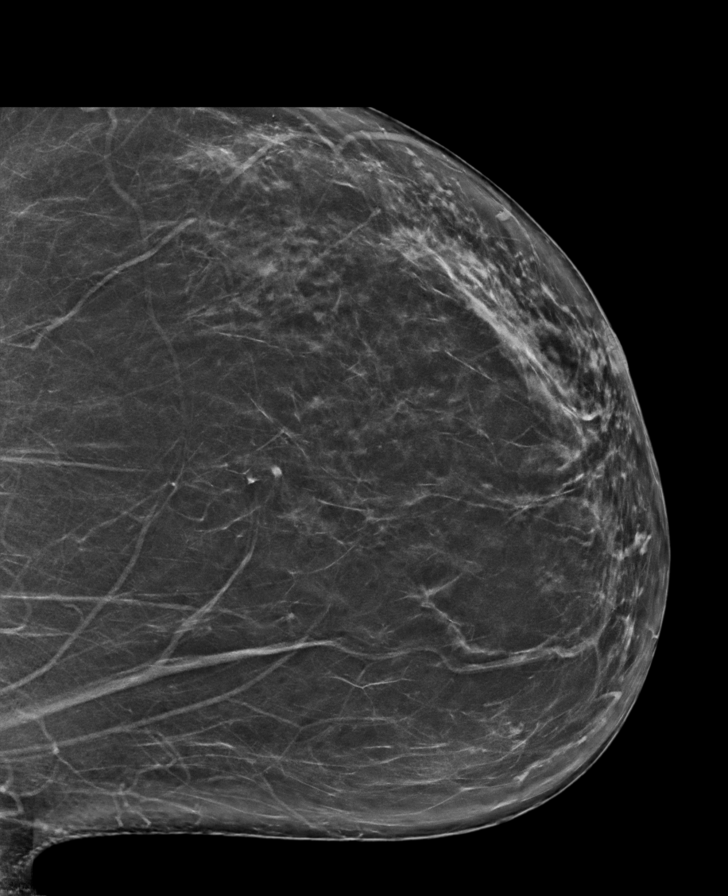

[R MLO synth-2D (2 of 2)]
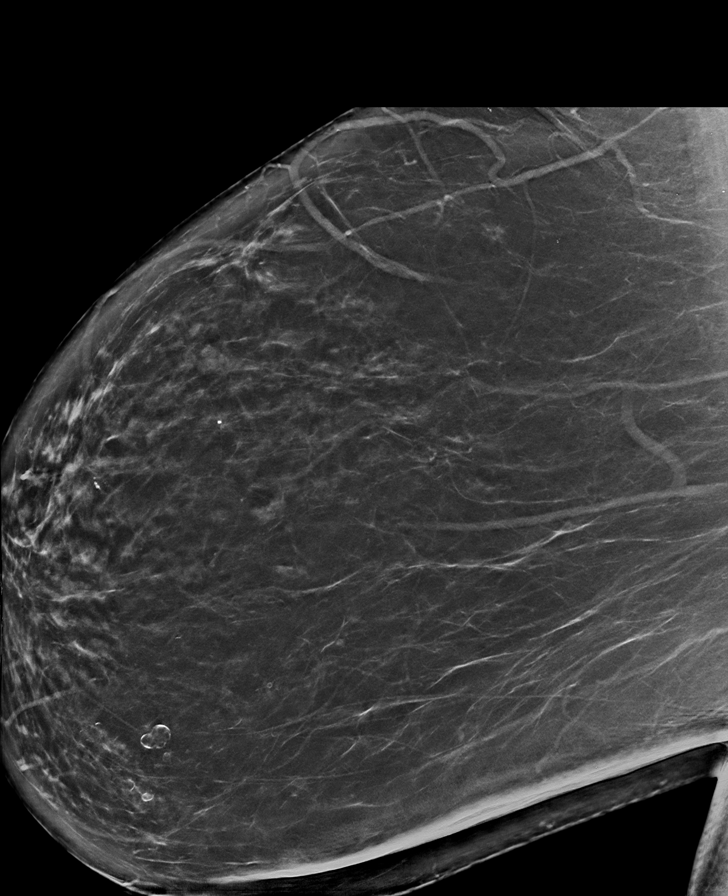

[R CC synth-2D (2 of 2)]
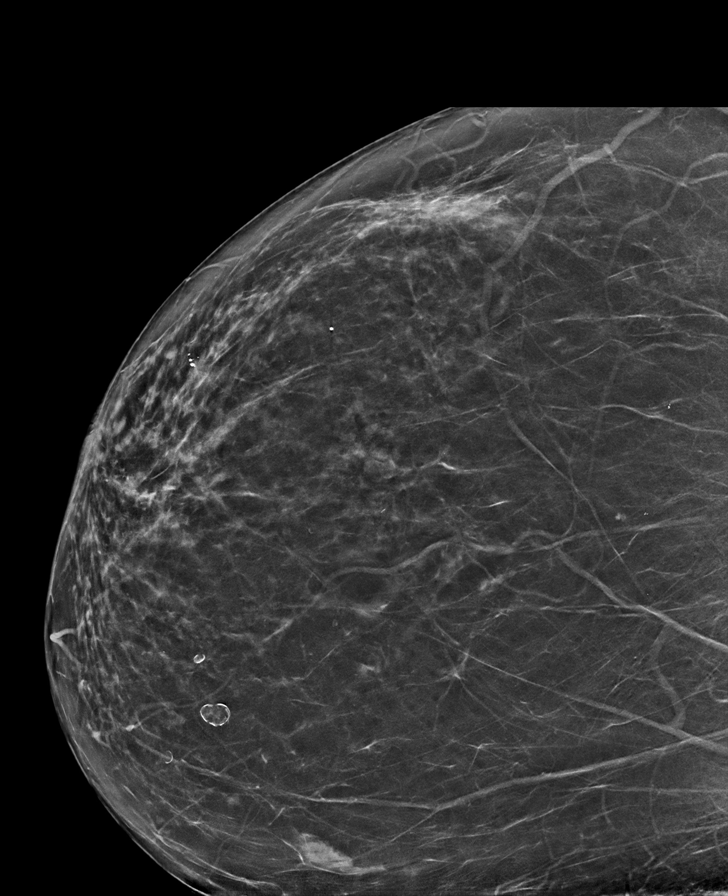

[L MLO synth-2D (2 of 2)]
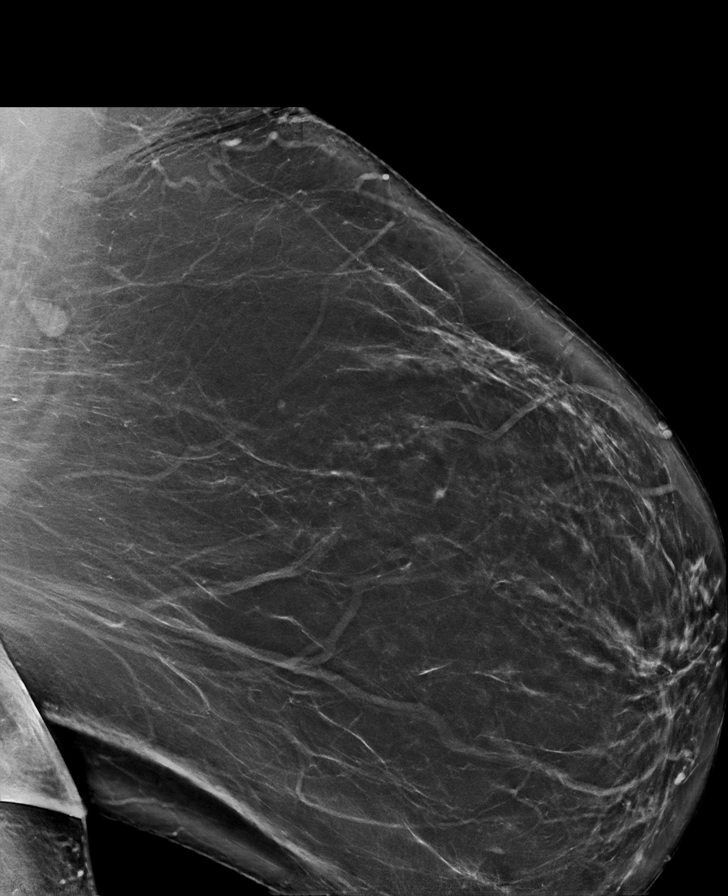

[R MLO tomo · tomo slice 43/85.0]
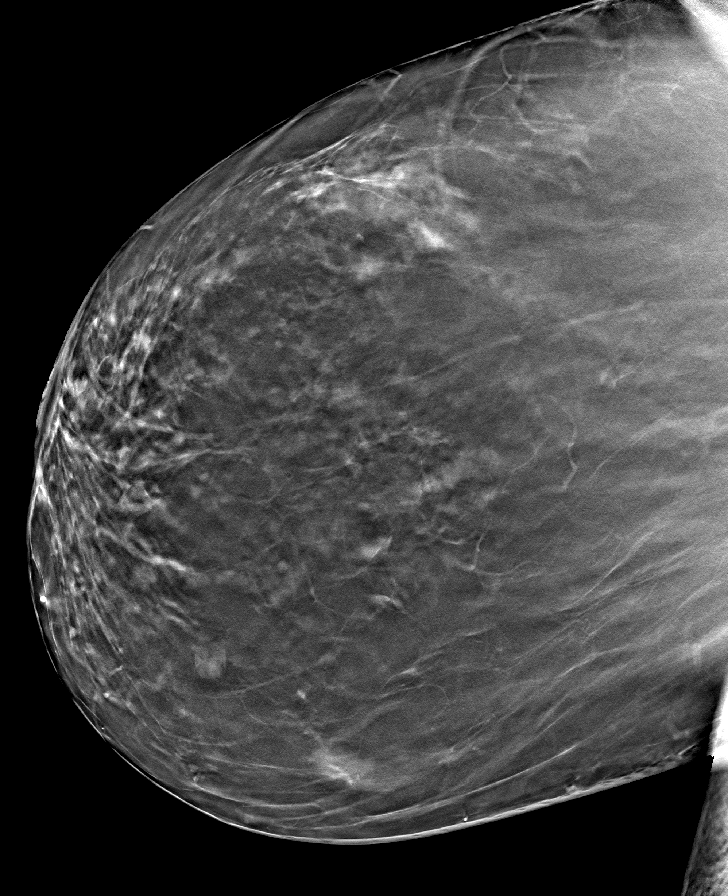

[8 of 40 positions shown; findings below may reference images not displayed]

ACR Breast Density Category b: There are scattered areas of
fibroglandular density.
FINDINGS: There are no findings suspicious for malignancy.
IMPRESSION: No mammographic evidence of malignancy. A result letter of this
screening mammogram will be mailed directly to the patient.

RECOMMENDATION:
Screening mammogram in one year. (Code:51-O-LD2)

BI-RADS CATEGORY  1: Negative.

## 2023-09-12 ENCOUNTER — Other Ambulatory Visit: Payer: Self-pay | Admitting: Internal Medicine

## 2023-09-12 DIAGNOSIS — B353 Tinea pedis: Secondary | ICD-10-CM

## 2023-09-13 NOTE — Telephone Encounter (Signed)
 Requested medications are due for refill today.  unsure  Requested medications are on the active medications list.  yes  Last refill. 03/26/2022 30g 2 rf  Future visit scheduled.   yes  Notes to clinic.  Refill not delegated.    Requested Prescriptions  Pending Prescriptions Disp Refills   clotrimazole -betamethasone  (LOTRISONE ) cream [Pharmacy Med Name: Clotrimazole -Betamethasone  1-0.05 % External Cream] 90 g     Sig: APPLY TOPICALLY TO AFFECTED  AREA(S) DAILY     Off-Protocol Failed - 09/13/2023  5:32 PM      Failed - Medication not assigned to a protocol, review manually.      Passed - Valid encounter within last 12 months    Recent Outpatient Visits           1 month ago Essential hypertension   [place] [facility] Medicine at [facility], Leita DEL, MD   4 months ago Annual physical exam   [facility] Primary Care & Sports Medicine at [facility], Leita DEL, MD       Future Appointments             In 2 months Justus, Leita DEL, MD [facility] Primary Care & Sports Medicine at [facility], [facility]

## 2023-09-14 NOTE — Telephone Encounter (Signed)
Please review medication refill  request

## 2023-09-16 ENCOUNTER — Other Ambulatory Visit: Payer: Self-pay | Admitting: Internal Medicine

## 2023-09-16 DIAGNOSIS — E118 Type 2 diabetes mellitus with unspecified complications: Secondary | ICD-10-CM

## 2023-09-20 NOTE — Telephone Encounter (Signed)
 Requested Prescriptions  Pending Prescriptions Disp Refills   empagliflozin  (JARDIANCE ) 25 MG TABS tablet [Pharmacy Med Name: Jardiance  25 MG Oral Tablet] 90 tablet 0    Sig: TAKE 1 TABLET BY MOUTH DAILY  BEFORE BREAKFAST     Endocrinology:  Diabetes - SGLT2 Inhibitors Passed - 09/20/2023  8:35 AM      Passed - Cr in normal range and within 360 days    Creat  Date Value Ref Range Status  01/03/2023 1.13 (H) 0.50 - 1.05 mg/dL Final   Creatinine, Ser  Date Value Ref Range Status  04/19/2023 0.76 0.57 - 1.00 mg/dL Final         Passed - HBA1C is between 0 and 7.9 and within 180 days    Hemoglobin A1C  Date Value Ref Range Status  08/05/2023 5.3 4.0 - 5.6 % Final   Hgb A1c MFr Bld  Date Value Ref Range Status  04/19/2023 6.3 (H) 4.8 - 5.6 % Final    Comment:             Prediabetes: 5.7 - 6.4          Diabetes: >6.4          Glycemic control for adults with diabetes: <7.0          Passed - eGFR in normal range and within 360 days    GFR calc Af Amer  Date Value Ref Range Status  01/28/2020 86 >59 mL/min/1.73 Final    Comment:    **In accordance with recommendations from the NKF-ASN Task force,**   [facility] is in the process of updating its eGFR calculation to the   2021 CKD-EPI creatinine equation that estimates kidney function   without a race variable.    GFR calc non Af Amer  Date Value Ref Range Status  01/28/2020 75 >59 mL/min/1.73 Final   eGFR  Date Value Ref Range Status  04/19/2023 89 >59 mL/min/1.73 Final         Passed - Valid encounter within last 6 months    Recent Outpatient Visits           1 month ago Essential hypertension   [place] [facility] Medicine at [facility], Leita DEL, MD   5 months ago Annual physical exam   [facility] Primary Care & Sports Medicine at [facility], Leita DEL, MD       Future Appointments             In 2 months Justus, Leita DEL, MD [facility] Primary Care & Sports Medicine  at [facility], [facility]

## 2023-10-29 ENCOUNTER — Other Ambulatory Visit: Payer: Self-pay | Admitting: Internal Medicine

## 2023-10-29 DIAGNOSIS — E118 Type 2 diabetes mellitus with unspecified complications: Secondary | ICD-10-CM

## 2023-10-31 NOTE — Telephone Encounter (Signed)
 Requested medication (s) are due for refill today -no  Requested medication (s) are on the active medication list -yes  Future visit scheduled -yes  Last refill: 08/02/23 6ml 1RF  Notes to clinic: off protocol- provider review   Requested Prescriptions  Pending Prescriptions Disp Refills   MOUNJARO  10 MG/0.5ML Pen [Pharmacy Med Name: MOUNJARO  PEN 10MG /0.5ML] 6 mL 3    Sig: INJECT THE CONTENTS OF ONE PEN  SUBCUTANEOUSLY WEEKLY AS  DIRECTED     Off-Protocol Failed - 10/31/2023 12:07 PM      Failed - Medication not assigned to a protocol, review manually.      Passed - Valid encounter within last 12 months    Recent Outpatient Visits           2 months ago Essential hypertension   [place] [facility] at [facility], Leita DEL, MD   6 months ago Annual physical exam   [facility] Medicine at [facility], Leita DEL, MD       Future Appointments             In 1 month Justus Leita DEL, MD [facility] Primary Care & Sports Medicine at [facility], [phone] [place]               Requested Prescriptions  Pending Prescriptions Disp Refills   MOUNJARO  10 MG/0.5ML Pen [Pharmacy Med Name: MOUNJARO  PEN 10MG /0.5ML] 6 mL 3    Sig: INJECT THE CONTENTS OF ONE PEN  SUBCUTANEOUSLY WEEKLY AS  DIRECTED     Off-Protocol Failed - 10/31/2023 12:07 PM      Failed - Medication not assigned to a protocol, review manually.      Passed - Valid encounter within last 12 months    Recent Outpatient Visits           2 months ago Essential hypertension   [place] [facility] at [facility], Leita DEL, MD   6 months ago Annual physical exam   [facility] & [facility] Medicine at [facility], Leita DEL, MD       Future Appointments             In 1 month Justus, Leita DEL, MD [facility] Primary Care & Sports Medicine at [facility], [phone] [place]

## 2023-11-17 ENCOUNTER — Encounter: Payer: Self-pay | Admitting: Internal Medicine

## 2023-11-17 ENCOUNTER — Encounter: Payer: Self-pay | Admitting: Obstetrics

## 2023-11-17 NOTE — Telephone Encounter (Signed)
 TC to pt, she has been taking Megace  since last visit, 07/06/23. Reassured of the light bleeding/spotting, can be degenerating fibroids or the Progestin thinning out the hyperplasia. If becomes heavy, notify me or straight to ED. We want to repeat her EMB 6 mos from prior, would be due around 01/06/24. Will have front reach out and schedule.

## 2023-11-28 ENCOUNTER — Encounter: Payer: Self-pay | Admitting: Obstetrics

## 2023-12-03 ENCOUNTER — Other Ambulatory Visit: Payer: Self-pay | Admitting: Internal Medicine

## 2023-12-03 DIAGNOSIS — E118 Type 2 diabetes mellitus with unspecified complications: Secondary | ICD-10-CM

## 2023-12-05 NOTE — Telephone Encounter (Signed)
 Requested medications are due for refill today.  yes  Requested medications are on the active medications list.  yes  Last refill. 09/20/2023 #90 0 rf  Future visit scheduled.   yes  Notes to clinic.  Expired labs    Requested Prescriptions  Pending Prescriptions Disp Refills   JARDIANCE  25 MG TABS tablet [Pharmacy Med Name: Jardiance  25 MG Oral Tablet] 90 tablet 3    Sig: TAKE 1 TABLET BY MOUTH DAILY  BEFORE BREAKFAST     Endocrinology:  Diabetes - SGLT2 Inhibitors Passed - 12/05/2023  2:50 PM      Passed - Cr in normal range and within 360 days    Creat  Date Value Ref Range Status  01/03/2023 1.13 (H) 0.50 - 1.05 mg/dL Final   Creatinine, Ser  Date Value Ref Range Status  04/19/2023 0.76 0.57 - 1.00 mg/dL Final         Passed - HBA1C is between 0 and 7.9 and within 180 days    Hemoglobin A1C  Date Value Ref Range Status  08/05/2023 5.3 4.0 - 5.6 % Final   Hgb A1c MFr Bld  Date Value Ref Range Status  04/19/2023 6.3 (H) 4.8 - 5.6 % Final    Comment:             Prediabetes: 5.7 - 6.4          Diabetes: >6.4          Glycemic control for adults with diabetes: <7.0          Passed - eGFR in normal range and within 360 days    GFR calc Af Amer  Date Value Ref Range Status  01/28/2020 86 >59 mL/min/1.73 Final    Comment:    **In accordance with recommendations from the NKF-ASN Task force,**   [facility] is in the process of updating its eGFR calculation to the   2021 CKD-EPI creatinine equation that estimates kidney function   without a race variable.    GFR calc non Af Amer  Date Value Ref Range Status  01/28/2020 75 >59 mL/min/1.73 Final   eGFR  Date Value Ref Range Status  04/19/2023 89 >59 mL/min/1.73 Final         Passed - Valid encounter within last 6 months    Recent Outpatient Visits           4 months ago Essential hypertension   [place] [facility] at [facility], Leita DEL, MD   7 months ago Annual physical  exam   [facility] Care & Sports Medicine at [facility], Leita DEL, MD       Future Appointments             In 4 days Justus Leita DEL, MD [facility] Primary Care & Sports Medicine at [facility], [phone] [place]

## 2023-12-09 ENCOUNTER — Encounter: Payer: Self-pay | Admitting: Internal Medicine

## 2023-12-09 ENCOUNTER — Ambulatory Visit: Admitting: Internal Medicine

## 2023-12-09 VITALS — BP 122/74 | HR 104 | Ht 65.0 in | Wt 245.0 lb

## 2023-12-09 DIAGNOSIS — I10 Essential (primary) hypertension: Secondary | ICD-10-CM | POA: Diagnosis not present

## 2023-12-09 DIAGNOSIS — Z7984 Long term (current) use of oral hypoglycemic drugs: Secondary | ICD-10-CM

## 2023-12-09 DIAGNOSIS — M109 Gout, unspecified: Secondary | ICD-10-CM | POA: Diagnosis not present

## 2023-12-09 DIAGNOSIS — Z23 Encounter for immunization: Secondary | ICD-10-CM | POA: Diagnosis not present

## 2023-12-09 DIAGNOSIS — E118 Type 2 diabetes mellitus with unspecified complications: Secondary | ICD-10-CM | POA: Diagnosis not present

## 2023-12-09 LAB — POCT GLYCOSYLATED HEMOGLOBIN (HGB A1C): Hemoglobin A1C: 5.5 % (ref 4.0–5.6)

## 2023-12-09 NOTE — Assessment & Plan Note (Signed)
 Well controlled blood pressure today. Current regimen is olmesartan , hydrochlorothiazide and amlodipine . No medication side effects noted. Continue the same regimen.

## 2023-12-09 NOTE — Assessment & Plan Note (Addendum)
 Currently medications are MTF,Jardiance  and Mounjaro .  No hypoglycemic episodes noted. Home blood sugars in the 100 range. Last visit medical regimen changes were to increase Mounjaro  to 10 mg.  She lost 15 more pounds since May. Lab Results  Component Value Date   HGBA1C 5.3 08/05/2023  A1C today = 5.5. Will continue the same medications and doses.

## 2023-12-09 NOTE — Progress Notes (Signed)
 Date:  12/09/2023   Name:  Anita Krueger   DOB:  [birth year]   MRN:  [id number]   Chief Complaint: Diabetes and Hypertension  Hypertension This is a chronic problem. The problem is controlled. Pertinent negatives include no chest pain, headaches, palpitations or shortness of breath. Past treatments include angiotensin blockers, calcium  channel blockers and diuretics. The current treatment provides significant improvement. There is no history of kidney disease, CAD/MI or CVA.  Diabetes She presents for her follow-up diabetic visit. She has type 2 diabetes mellitus. Pertinent negatives for hypoglycemia include no dizziness or headaches. Pertinent negatives for diabetes include no chest pain, no fatigue and no weakness. Pertinent negatives for diabetic complications include no CVA. Current diabetic treatments: MTF, Jardiance , Mounjaro .  Ankle pain - she had acute onset of ankle pain with swelling and warmth that spread to her great toe on the right.  She went to UC last week and was prescribed indomethacin for possible gout.  She is much improved but having stomach upset from indomethacin.  Review of Systems  Constitutional:  Negative for fatigue and unexpected weight change.  HENT:  Negative for trouble swallowing.   Eyes:  Negative for visual disturbance.  Respiratory:  Negative for cough, chest tightness, shortness of breath and wheezing.   Cardiovascular:  Negative for chest pain, palpitations and leg swelling.  Gastrointestinal:  Negative for abdominal pain, constipation and diarrhea.  Musculoskeletal:  Positive for gait problem. Negative for myalgias. Arthralgias: right ankle and toe. Neurological:  Negative for dizziness, weakness, light-headedness and headaches.     Lab Results  Component Value Date   NA 142 04/19/2023   K 4.3 04/19/2023   CO2 19 (L) 04/19/2023   GLUCOSE 87 04/19/2023   BUN 19 04/19/2023   CREATININE 0.76 04/19/2023   CALCIUM  10.0 04/19/2023   EGFR 89  04/19/2023   GFRNONAA 75 01/28/2020   Lab Results  Component Value Date   CHOL 134 04/19/2023   HDL 44 04/19/2023   LDLCALC 70 04/19/2023   TRIG 111 04/19/2023   CHOLHDL 3.0 04/19/2023   Lab Results  Component Value Date   TSH 0.947 04/19/2023   Lab Results  Component Value Date   HGBA1C 5.5 12/09/2023   Lab Results  Component Value Date   WBC 11.8 (H) 04/19/2023   HGB 14.6 04/19/2023   HCT 43.8 04/19/2023   MCV 94 04/19/2023   PLT 283 04/19/2023   Lab Results  Component Value Date   ALT 36 (H) 04/19/2023   AST 37 04/19/2023   ALKPHOS 85 04/19/2023   BILITOT 0.2 04/19/2023   Lab Results  Component Value Date   VD25OH 14 (L) 01/03/2023     Patient Active Problem List   Diagnosis Date Noted   Endometrial hyperplasia with atypia 07/06/2023   PMB (postmenopausal bleeding) 06/01/2023   Vitamin D  deficiency 04/22/2023   Primary osteoarthritis of right knee 11/27/2022   Need for influenza vaccination 11/27/2022   Hypercalcemia 08/13/2022   Patellar tendonitis of right knee 11/07/2020   Tubular adenoma of colon 04/03/2018   Iron deficiency anemia 01/24/2018   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   Hip pain, acute, left 05/12/2016   Essential hypertension 04/04/2015   Type II diabetes mellitus with complication (HCC) 01/27/2015   Hyperlipidemia associated with type 2 diabetes mellitus (HCC) 09/29/2014    No Known Allergies  Past Surgical History:  Procedure Laterality Date   COLONOSCOPY WITH PROPOFOL  N/A 03/31/2018   Procedure: COLONOSCOPY WITH PROPOFOL ;  Surgeon:  Therisa Bi, MD;  Location: [facility] ENDOSCOPY;  Service: Gastroenterology;  Laterality: N/A;   DILATATION & CURETTAGE/HYSTEROSCOPY WITH MYOSURE N/A 03/31/2017   Procedure: DILATATION & CURETTAGE/HYSTEROSCOPY WITH MYOSURE;  Surgeon: Leonce Garnette BIRCH, MD;  Location: [facility] ORS;  Service: Gynecology;  Laterality: N/A;   LAPAROSCOPY      Social History   Tobacco Use   Smoking  status: Former    Types: Cigarettes   Smokeless tobacco: Never   Tobacco comments:    40 years ago  Substance Use Topics   Alcohol use: Yes    Alcohol/week: 2.0 standard drinks of alcohol    Types: 2 Shots of liquor per week    Comment: occasional   Drug use: No     Medication list has been reviewed and updated.  Current Meds  Medication Sig   Acetaminophen  (TYLENOL  PO) Take by mouth as needed.   amLODipine  (NORVASC ) 5 MG tablet Take 1 tablet (5 mg total) by mouth daily.   Blood Glucose Monitoring Suppl (CONTOUR NEXT MONITOR) w/Device KIT USE AS DIRECTED   Carboxymethylcellul-Glycerin 1-0.25 % SOLN Place 1 drop into both eyes 3 (three) times daily as needed (dry eyes).   carboxymethylcellulose (REFRESH PLUS) 0.5 % SOLN Place 1 drop into both eyes 3 (three) times daily as needed (dry eyes).   cholecalciferol (VITAMIN D3) 25 MCG (1000 UNIT) tablet Take 1,000 Units by mouth daily. (Patient taking differently: Take 2,000 Units by mouth daily.)   clotrimazole -betamethasone  (LOTRISONE ) cream APPLY TOPICALLY TO AFFECTED  AREA(S) DAILY   empagliflozin  (JARDIANCE ) 25 MG TABS tablet TAKE 1 TABLET BY MOUTH DAILY  BEFORE BREAKFAST   Ferrous Sulfate (IRON PO) Take by mouth daily.   glucose blood (ONETOUCH ULTRA) test strip USE 1 STRIP UP TO 4 TIMES  DAILY AS DIRECTED   indomethacin (INDOCIN) 50 MG capsule Take 50 mg by mouth 3 (three) times daily.   Lancets (ONETOUCH DELICA PLUS LANCET33G) MISC 1 each by Does not apply route in the morning and at bedtime.   loratadine (CLARITIN) 10 MG tablet Take 10 mg by mouth daily as needed for allergies.   medroxyPROGESTERone  (PROVERA ) 10 MG tablet Take 2 tablets (20 mg total) by mouth at bedtime.   megestrol  (MEGACE ) 40 MG tablet Take 40 mg by mouth daily.   metFORMIN  (GLUCOPHAGE -XR) 500 MG 24 hr tablet TAKE 1 TABLET BY MOUTH TWICE  DAILY BEFORE A MEAL (Patient taking differently: Take 500 mg by mouth daily with breakfast.)   olmesartan -hydrochlorothiazide  (BENICAR  HCT) 40-25 MG tablet Take 1 tablet by mouth daily.   rosuvastatin  (CRESTOR ) 10 MG tablet TAKE 1 TABLET BY MOUTH DAILY   tirzepatide  (MOUNJARO ) 10 MG/0.5ML Pen INJECT THE CONTENTS OF ONE PEN  SUBCUTANEOUSLY WEEKLY AS  DIRECTED   UNABLE TO FIND as needed. Med Name: biofreeze       12/09/2023   10:39 AM 08/05/2023   10:32 AM 04/19/2023    1:26 PM 11/25/2022   11:52 AM  GAD 7 : Generalized Anxiety Score  Nervous, Anxious, on Edge 1 2 2 1   Control/stop worrying 1 1 1 1   Worry too much - different things 1 1 1 1   Trouble relaxing 1 1 1 1   Restless 0 1 1 1   Easily annoyed or irritable  1 1 1   Afraid - awful might happen 0 1 1 1   Total GAD 7 Score  8 8 7   Anxiety Difficulty Not difficult at all Not difficult at all Somewhat difficult Not difficult at all  12/09/2023   10:39 AM 08/05/2023   10:32 AM 04/19/2023    1:26 PM  Depression screen PHQ 2/9  Decreased Interest 0 1 0  Down, Depressed, Hopeless 0 0 0  PHQ - 2 Score 0 1 0  Altered sleeping 0 1 2  Tired, decreased energy 0 1 2  Change in appetite 0 2 2  Feeling bad or failure about yourself  0 0 0  Trouble concentrating 0 0 2  Moving slowly or fidgety/restless 0 0 1  Suicidal thoughts 0 0 0  PHQ-9 Score 0 5 9  Difficult doing work/chores Not difficult at all Not difficult at all Not difficult at all    BP Readings from Last 3 Encounters:  12/09/23 122/74  08/05/23 124/72  07/06/23 109/71    Physical Exam Vitals and nursing note reviewed.  Constitutional:      General: She is not in acute distress.    Appearance: Normal appearance. She is well-developed.  HENT:     Head: Normocephalic and atraumatic.  Neck:     Vascular: No carotid bruit.  Cardiovascular:     Rate and Rhythm: Normal rate and regular rhythm.  Pulmonary:     Effort: Pulmonary effort is normal. No respiratory distress.     Breath sounds: No wheezing or rhonchi.  Musculoskeletal:     Cervical back: Normal range of motion.     Right  lower leg: No edema.     Left lower leg: No edema.     Right ankle: Tenderness (mild anterior tenderness) present.     Right foot: Swelling (mild swelling first MTP) and tenderness present.  Lymphadenopathy:     Cervical: No cervical adenopathy.  Skin:    General: Skin is warm and dry.     Findings: No rash.  Neurological:     Mental Status: She is alert and oriented to person, place, and time.  Psychiatric:        Mood and Affect: Mood normal.        Behavior: Behavior normal.     Wt Readings from Last 3 Encounters:  12/09/23 245 lb (111.1 kg)  08/05/23 247 lb (112 kg)  07/06/23 259 lb (117.5 kg)    BP 122/74   Pulse (!) 104   Ht 5' 5 (1.651 m)   Wt 245 lb (111.1 kg)   LMP 05/22/2018 (Exact Date)   SpO2 99%   BMI 40.77 kg/m   Assessment and Plan:  Problem List Items Addressed This Visit       Unprioritized   Type II diabetes mellitus with complication (HCC) (Chronic)   Currently medications are MTF,Jardiance  and Mounjaro .  No hypoglycemic episodes noted. Home blood sugars in the 100 range. Last visit medical regimen changes were to increase Mounjaro  to 10 mg.  She lost 15 more pounds since May. Lab Results  Component Value Date   HGBA1C 5.3 08/05/2023  A1C today = 5.5. Will continue the same medications and doses.       Relevant Orders   POCT glycosylated hemoglobin (Hb A1C) (Completed)   Essential hypertension - Primary (Chronic)   Well controlled blood pressure today. Current regimen is olmesartan , hydrochlorothiazide and amlodipine . No medication side effects noted. Continue the same regimen.        Other Visit Diagnoses       Acute gout of right ankle, unspecified cause       much improved with indomethacin finish course and use Tylenol  as needed dietary recommendations given  Relevant Medications   indomethacin (INDOCIN) 50 MG capsule   megestrol  (MEGACE ) 40 MG tablet     Long term current use of oral hypoglycemic drug         Encounter  for immunization       Relevant Orders   Flu vaccine trivalent PF, 6mos and older(Flulaval,Afluria,Fluarix,Fluzone) (Completed)       Return in about 5 months (around 05/08/2024) for CPX - Dr. Lemon.    Leita HILARIO Adie, MD [facility] Primary Care and Sports Medicine [place]

## 2023-12-12 ENCOUNTER — Other Ambulatory Visit: Payer: Self-pay | Admitting: Internal Medicine

## 2023-12-12 DIAGNOSIS — E118 Type 2 diabetes mellitus with unspecified complications: Secondary | ICD-10-CM

## 2023-12-13 NOTE — Telephone Encounter (Signed)
 Requested Prescriptions  Pending Prescriptions Disp Refills   metFORMIN  (GLUCOPHAGE -XR) 500 MG 24 hr tablet [Pharmacy Med Name: metFORMIN  HCl ER 500 MG Oral Tablet Extended Release 24 Hour] 180 tablet 3    Sig: TAKE 1 TABLET BY MOUTH TWICE  DAILY BEFORE A MEAL     Endocrinology:  Diabetes - Biguanides Failed - 12/13/2023  2:55 PM      Failed - B12 Level in normal range and within 720 days    No results found for: VITAMINB12       Passed - Cr in normal range and within 360 days    Creat  Date Value Ref Range Status  01/03/2023 1.13 (H) 0.50 - 1.05 mg/dL Final   Creatinine, Ser  Date Value Ref Range Status  04/19/2023 0.76 0.57 - 1.00 mg/dL Final         Passed - HBA1C is between 0 and 7.9 and within 180 days    Hemoglobin A1C  Date Value Ref Range Status  12/09/2023 5.5 4.0 - 5.6 % Final   Hgb A1c MFr Bld  Date Value Ref Range Status  04/19/2023 6.3 (H) 4.8 - 5.6 % Final    Comment:             Prediabetes: 5.7 - 6.4          Diabetes: >6.4          Glycemic control for adults with diabetes: <7.0          Passed - eGFR in normal range and within 360 days    GFR calc Af Amer  Date Value Ref Range Status  01/28/2020 86 >59 mL/min/1.73 Final    Comment:    **In accordance with recommendations from the NKF-ASN Task force,**   [facility] is in the process of updating its eGFR calculation to the   2021 CKD-EPI creatinine equation that estimates kidney function   without a race variable.    GFR calc non Af Amer  Date Value Ref Range Status  01/28/2020 75 >59 mL/min/1.73 Final   eGFR  Date Value Ref Range Status  04/19/2023 89 >59 mL/min/1.73 Final         Passed - Valid encounter within last 6 months    Recent Outpatient Visits           4 days ago Essential hypertension   [place] [facility] at [facility], Leita DEL, MD   4 months ago Essential hypertension   [place] [facility] at [facility], Leita DEL, MD   7 months ago Annual physical exam   [facility] Primary Care & Sports Medicine at [facility], Leita DEL, MD              Passed - CBC within normal limits and completed in the last 12 months    WBC  Date Value Ref Range Status  04/19/2023 11.8 (H) 3.4 - 10.8 x10E3/uL Final   RBC  Date Value Ref Range Status  04/19/2023 4.67 3.77 - 5.28 x10E6/uL Final  04/04/2015 2 (A) 4.04 - 5.48 M/uL Final   Hemoglobin  Date Value Ref Range Status  04/19/2023 14.6 11.1 - 15.9 g/dL Final   Hematocrit  Date Value Ref Range Status  04/19/2023 43.8 34.0 - 46.6 % Final   MCHC  Date Value Ref Range Status  04/19/2023 33.3 31.5 - 35.7 g/dL Final   [facility]  Date Value Ref Range Status  04/19/2023  31.3 26.6 - 33.0 pg Final   MCV  Date Value Ref Range Status  04/19/2023 94 79 - 97 fL Final   No results found for: PLTCOUNTKUC, LABPLAT, POCPLA RDW  Date Value Ref Range Status  04/19/2023 13.6 11.7 - 15.4 % Final

## 2023-12-16 ENCOUNTER — Other Ambulatory Visit: Payer: Self-pay | Admitting: Internal Medicine

## 2023-12-16 DIAGNOSIS — E118 Type 2 diabetes mellitus with unspecified complications: Secondary | ICD-10-CM

## 2023-12-17 NOTE — Telephone Encounter (Signed)
 Requested medication (s) are due for refill today: Yes  Requested medication (s) are on the active medication list: Yes  Last refill:  10/31/23  Future visit scheduled: Yes  Notes to clinic:  Unable to refill due to no refill protocol for this medication.      Requested Prescriptions  Pending Prescriptions Disp Refills   MOUNJARO  10 MG/0.5ML Pen [Pharmacy Med Name: MOUNJARO  PEN 10MG /0.5ML] 4 mL 5    Sig: INJECT THE CONTENTS OF ONE PEN  SUBCUTANEOUSLY WEEKLY AS  DIRECTED     Off-Protocol Failed - 12/17/2023  9:34 AM      Failed - Medication not assigned to a protocol, review manually.      Passed - Valid encounter within last 12 months    Recent Outpatient Visits           1 week ago Essential hypertension   [place] [facility] at [facility], Leita DEL, MD   4 months ago Essential hypertension   [place] [facility] at [facility], Leita DEL, MD   8 months ago Annual physical exam   [facility] Care & [facility] Medicine at [facility], Leita DEL, MD

## 2024-01-13 ENCOUNTER — Ambulatory Visit: Payer: 59 | Admitting: Endocrinology

## 2024-01-15 ENCOUNTER — Other Ambulatory Visit: Payer: Self-pay | Admitting: Internal Medicine

## 2024-01-15 DIAGNOSIS — E118 Type 2 diabetes mellitus with unspecified complications: Secondary | ICD-10-CM

## 2024-01-17 NOTE — Telephone Encounter (Signed)
 Requested medication (s) are due for refill today: Yes  Requested medication (s) are on the active medication list: Yes  Last refill:  12/19/23  Future visit scheduled: Yes  Notes to clinic:  Manual review.    Requested Prescriptions  Pending Prescriptions Disp Refills   MOUNJARO  10 MG/0.5ML Pen [Pharmacy Med Name: MOUNJARO  PEN 10MG /0.5ML] 2 mL 11    Sig: INJECT THE CONTENTS OF ONE PEN  SUBCUTANEOUSLY WEEKLY AS  DIRECTED     Off-Protocol Failed - 01/17/2024  1:56 PM      Failed - Medication not assigned to a protocol, review manually.      Passed - Valid encounter within last 12 months    Recent Outpatient Visits           1 month ago Essential hypertension   [place] [facility] at [facility], Leita DEL, MD   5 months ago Essential hypertension   [place] [facility] at [facility], Leita DEL, MD   9 months ago Annual physical exam   [facility] Medicine at [facility], Leita DEL, MD

## 2024-01-18 ENCOUNTER — Ambulatory Visit: Admitting: Obstetrics

## 2024-01-19 NOTE — Progress Notes (Unsigned)
 GYNECOLOGY PROGRESS NOTE  Subjective:  PCP: Justus Leita DEL, MD  Patient ID: Anita Krueger, female    DOB: February 03, 1961, 63 y.o.   MRN: [id number]  HPI  Patient is a 63 y.o. G27P0020 female with PMH of EH w/o atypia in 2018, 2 negative EMB in 10/2017 and 09/2018 and not on Progestin since 2021, who presents for follow up EIN, diagnosed April 2025. She is taking Megace  and continues to have some scant PMB, last in Jan 2025, no further after starting the Megace .  Last seen in May we reviewed recommendations for a hysterectomy but she did not feel ready at that time. She was started on Megace  in June 2025 and instructed to follow up in 6 months for another EMB, for which she is here today.  The following portions of the patient's history were reviewed and updated as appropriate: allergies, current medications, past family history, past medical history, past social history, past surgical history, and problem list.  Review of Systems Pertinent items are noted in HPI.   Objective:   Blood pressure 138/86, pulse (!) 102, weight 230 lb (104.3 kg), last menstrual period 05/22/2018. Body mass index is 38.27 kg/m.  General appearance: alert and cooperative Abdomen: soft, non-tender; bowel sounds normal; no masses,  no organomegaly Pelvic: cervix normal in appearance, external genitalia normal, no adnexal masses or tenderness, no cervical motion tenderness, rectovaginal septum normal, uterus normal size, shape, and consistency, vagina normal without discharge, and 1cm cervical polyp protruding from external os. Extremities: extremities normal, atraumatic, no cyanosis or edema Neurologic: Grossly normal  Cervical Polypectomy and Endometrial Biopsy Procedure Note  The patient was positioned on the exam table in the dorsal lithotomy position. Bimanual exam confirmed uterine position and size. A speculum was placed into the vagina. A 1cm polyp was protruding from the cervical os. Cervix visualized and polyp  noted.  Ring forcep applied to cervix and twisting motion removed polyp intact.  Hemostasis obtained.  The cervix was cleansed with Betadine x 3. The pipette was placed into the endocervical canal and advanced to the uterine fundus without the aid of a tenaculum. Using a piston like technique, with vacuum created by withdrawing the stylus, the endometrial specimen was obtained and transferred to the biopsy container. Minimal bleeding encountered. The procedure was well tolerated.   Uterine Position: mid anterior posterior   Uterine Length: 7.5cm   Uterine Specimen: Scant   Ultrasound 04/22/23 FINDINGS: Uterus   Measurements: 7.5 x 4.2 x 4.3 cm = volume: 71.3 mL. Heterogeneous echogenic uterus could correlate with fibroid degeneration with calcified uterine fibroids   Endometrium   Thickness: Not visualized.  No focal abnormality visualized.   Right ovary   Measurements: Not visualized = volume: mL. Normal appearance/no adnexal mass.   Left ovary   Measurements: Not visualized = volume: mL. Normal appearance/no adnexal mass.   Pulsed Doppler evaluation of both ovaries demonstrates normal low-resistance arterial and venous waveforms.   Other findings   No abnormal free fluid.   IMPRESSION: *Heterogeneous echogenic uterus could correlate with fibroid degeneration with calcified uterine fibroids. *Nonvisualization of the ovaries. *No free fluid in the cul-de-sac. *Endometrium not visualized. *No adnexal mass. *No evidence of ovarian torsion.  EMB Pathology 06/01/23 A. ENDOMETRIUM, BIOPSY:  - Predominantly inactive endometrium with focal cytologic atypia, see  comment   COMMENT:  Differential diagnosis includes metaplasia with atypia.  However, focal  endometrial intraepithelial neoplasia (EIN) cannot be ruled out.    Assessment/Plan:   1. Endometrial hyperplasia  with atypia   2. Cervical polyp    63 y.o. G2P0020 with EH w/atypia, still wishing to avoid  hysterectomy, on Megace , here for 48mo follow up EMB. Cervical polypectomy performed at the same time due to an oozing, 1cm polyp noted on exam. Reiterated treatment course with Progestin and serial EMBs, patient did not recall this conversation from prior visit. Emphasized importance of surveillance. Follow up 6 mos, sooner prn   Estil Mangle, DO [facility]

## 2024-01-24 ENCOUNTER — Encounter: Payer: Self-pay | Admitting: Obstetrics

## 2024-01-24 ENCOUNTER — Other Ambulatory Visit (HOSPITAL_COMMUNITY)
Admission: RE | Admit: 2024-01-24 | Discharge: 2024-01-24 | Disposition: A | Source: Ambulatory Visit | Attending: Obstetrics | Admitting: Obstetrics

## 2024-01-24 ENCOUNTER — Ambulatory Visit: Admitting: Obstetrics

## 2024-01-24 VITALS — BP 138/86 | HR 102 | Wt 230.0 lb

## 2024-01-24 DIAGNOSIS — N858 Other specified noninflammatory disorders of uterus: Secondary | ICD-10-CM | POA: Diagnosis not present

## 2024-01-24 DIAGNOSIS — N8502 Endometrial intraepithelial neoplasia [EIN]: Secondary | ICD-10-CM

## 2024-01-24 DIAGNOSIS — N841 Polyp of cervix uteri: Secondary | ICD-10-CM | POA: Insufficient documentation

## 2024-01-25 ENCOUNTER — Encounter: Payer: Self-pay | Admitting: Student

## 2024-01-26 ENCOUNTER — Ambulatory Visit: Payer: Self-pay | Admitting: Obstetrics

## 2024-01-26 ENCOUNTER — Ambulatory Visit: Admitting: Obstetrics

## 2024-01-26 LAB — SURGICAL PATHOLOGY

## 2024-02-13 ENCOUNTER — Other Ambulatory Visit: Payer: Self-pay | Admitting: Internal Medicine

## 2024-02-13 DIAGNOSIS — E118 Type 2 diabetes mellitus with unspecified complications: Secondary | ICD-10-CM

## 2024-02-14 ENCOUNTER — Encounter: Payer: Self-pay | Admitting: Student

## 2024-02-14 DIAGNOSIS — E118 Type 2 diabetes mellitus with unspecified complications: Secondary | ICD-10-CM

## 2024-02-14 MED ORDER — LANCETS MISC: 1.0000 [IU] | Freq: Four times a day (QID) | 1 refills | Status: AC

## 2024-02-14 MED ORDER — ACCU-CHEK GUIDE TEST VI STRP: ORAL_STRIP | 12 refills | Status: AC

## 2024-02-14 MED ORDER — ACCU-CHEK GUIDE W/DEVICE KIT: 1.0000 | PACK | Freq: Once | 0 refills | Status: AC

## 2024-02-14 MED ORDER — MOUNJARO 10 MG/0.5ML ~~LOC~~ SOAJ: 10.0000 mg | SUBCUTANEOUS | 0 refills | Status: DC

## 2024-02-14 NOTE — Telephone Encounter (Signed)
 Requested medication (s) are due for refill today: yes  Requested medication (s) are on the active medication list: yes  Last refill:  01/17/24  Future visit scheduled: yes  Notes to clinic:  Medication not assigned to a protocol, review manually.      Requested Prescriptions  Pending Prescriptions Disp Refills   MOUNJARO  10 MG/0.5ML Pen [Pharmacy Med Name: MOUNJARO  PEN 10MG /0.5ML] 2 mL 11    Sig: INJECT THE CONTENTS OF ONE PEN  SUBCUTANEOUSLY WEEKLY AS  DIRECTED     Off-Protocol Failed - 02/14/2024 12:24 PM      Failed - Medication not assigned to a protocol, review manually.      Passed - Valid encounter within last 12 months    Recent Outpatient Visits           2 months ago Essential hypertension   [place] [facility], Leita DEL, MD   6 months ago Essential hypertension   [place] [facility] at [facility], Leita DEL, MD   10 months ago Annual physical exam   [facility] at [facility], Leita DEL, MD

## 2024-03-08 ENCOUNTER — Other Ambulatory Visit: Payer: Self-pay | Admitting: Student

## 2024-03-08 DIAGNOSIS — E118 Type 2 diabetes mellitus with unspecified complications: Secondary | ICD-10-CM

## 2024-03-08 NOTE — Telephone Encounter (Signed)
 Please review medication refill request

## 2024-03-08 NOTE — Telephone Encounter (Signed)
 Requested medications are due for refill today.  yes  Requested medications are on the active medications list.  yes  Last refill. 02/14/2024 2mL 0 rf  Future visit scheduled.   yes  Notes to clinic.  Medication not assigned to a protocol. Please review for refill.     Requested Prescriptions  Pending Prescriptions Disp Refills   MOUNJARO  10 MG/0.5ML Pen [Pharmacy Med Name: MOUNJARO  PEN 10MG /0.5ML] 2 mL 11    Sig: INJECT THE CONTENTS OF ONE PEN  SUBCUTANEOUSLY WEEKLY AS  DIRECTED     Off-Protocol Failed - 03/08/2024  4:15 PM      Failed - Medication not assigned to a protocol, review manually.      Passed - Valid encounter within last 12 months    Recent Outpatient Visits           3 months ago Essential hypertension   [place] [facility], Leita DEL, MD   7 months ago Essential hypertension   [place] [facility] at [facility], Leita DEL, MD   10 months ago Annual physical exam   [facility] at [facility], Leita DEL, MD

## 2024-05-08 ENCOUNTER — Ambulatory Visit: Admitting: Student

## 2024-05-18 ENCOUNTER — Ambulatory Visit: Admitting: Student
